# Patient Record
Sex: Male | Born: 1935 | Race: White | Hispanic: No | Marital: Married | Smoking: Never smoker
Health system: Southern US, Community
[De-identification: ages and names within clinical notes are randomized; demographics above are authoritative.]

## PROBLEM LIST (undated history)

## (undated) DIAGNOSIS — D696 Thrombocytopenia, unspecified: Secondary | ICD-10-CM

## (undated) DIAGNOSIS — I10 Essential (primary) hypertension: Secondary | ICD-10-CM

## (undated) DIAGNOSIS — M1990 Unspecified osteoarthritis, unspecified site: Secondary | ICD-10-CM

## (undated) DIAGNOSIS — E785 Hyperlipidemia, unspecified: Secondary | ICD-10-CM

## (undated) DIAGNOSIS — I480 Paroxysmal atrial fibrillation: Secondary | ICD-10-CM

## (undated) DIAGNOSIS — E039 Hypothyroidism, unspecified: Secondary | ICD-10-CM

## (undated) HISTORY — PX: US ECHOCARDIOGRAPHY: HXRAD669

## (undated) HISTORY — PX: CORONARY ARTERY BYPASS GRAFT: SHX141

## (undated) HISTORY — DX: Hyperlipidemia, unspecified: E78.5

## (undated) HISTORY — DX: Unspecified osteoarthritis, unspecified site: M19.90

## (undated) HISTORY — DX: Paroxysmal atrial fibrillation: I48.0

## (undated) HISTORY — PX: CARDIAC CATHETERIZATION: SHX172

## (undated) HISTORY — DX: Hypothyroidism, unspecified: E03.9

## (undated) HISTORY — DX: Essential (primary) hypertension: I10

## (undated) HISTORY — DX: Thrombocytopenia, unspecified: D69.6

## (undated) HISTORY — PX: OTHER SURGICAL HISTORY: SHX169

---

## 2003-08-07 ENCOUNTER — Emergency Department (HOSPITAL_COMMUNITY): Admission: EM | Admit: 2003-08-07 | Discharge: 2003-08-07 | Payer: Self-pay | Admitting: Emergency Medicine

## 2004-12-10 ENCOUNTER — Ambulatory Visit: Payer: Self-pay | Admitting: Unknown Physician Specialty

## 2005-03-21 ENCOUNTER — Ambulatory Visit: Payer: Self-pay

## 2006-02-05 ENCOUNTER — Encounter: Admission: RE | Admit: 2006-02-05 | Discharge: 2006-02-05 | Payer: Self-pay | Admitting: Sports Medicine

## 2006-06-02 ENCOUNTER — Ambulatory Visit: Payer: Self-pay

## 2006-09-18 ENCOUNTER — Encounter: Payer: Self-pay | Admitting: Vascular Surgery

## 2006-09-19 ENCOUNTER — Encounter: Payer: Self-pay | Admitting: Vascular Surgery

## 2006-09-19 ENCOUNTER — Inpatient Hospital Stay (HOSPITAL_COMMUNITY): Admission: RE | Admit: 2006-09-19 | Discharge: 2006-09-27 | Payer: Self-pay | Admitting: *Deleted

## 2006-10-21 ENCOUNTER — Encounter: Admission: RE | Admit: 2006-10-21 | Discharge: 2006-10-21 | Payer: Self-pay | Admitting: Surgery

## 2006-10-23 ENCOUNTER — Encounter (HOSPITAL_COMMUNITY): Admission: RE | Admit: 2006-10-23 | Discharge: 2007-01-21 | Payer: Self-pay | Admitting: *Deleted

## 2007-01-22 ENCOUNTER — Encounter (HOSPITAL_COMMUNITY): Admission: RE | Admit: 2007-01-22 | Discharge: 2007-02-06 | Payer: Self-pay | Admitting: *Deleted

## 2009-01-09 DEATH — deceased

## 2009-04-24 ENCOUNTER — Ambulatory Visit: Payer: Self-pay | Admitting: Internal Medicine

## 2010-01-12 ENCOUNTER — Inpatient Hospital Stay (HOSPITAL_COMMUNITY): Admission: EM | Admit: 2010-01-12 | Discharge: 2010-01-14 | Payer: Self-pay | Admitting: Emergency Medicine

## 2010-01-13 ENCOUNTER — Encounter (INDEPENDENT_AMBULATORY_CARE_PROVIDER_SITE_OTHER): Payer: Self-pay | Admitting: Internal Medicine

## 2010-04-12 ENCOUNTER — Ambulatory Visit: Payer: Self-pay | Admitting: Unknown Physician Specialty

## 2010-10-01 ENCOUNTER — Ambulatory Visit: Payer: Self-pay | Admitting: Internal Medicine

## 2010-10-10 ENCOUNTER — Ambulatory Visit: Payer: Self-pay

## 2010-12-02 ENCOUNTER — Encounter: Payer: Self-pay | Admitting: Surgery

## 2011-02-03 LAB — CK TOTAL AND CKMB (NOT AT ARMC)
CK, MB: 0.9 ng/mL (ref 0.3–4.0)
Relative Index: INVALID (ref 0.0–2.5)
Relative Index: INVALID (ref 0.0–2.5)
Total CK: 34 U/L (ref 7–232)
Total CK: 34 U/L (ref 7–232)

## 2011-02-03 LAB — COMPREHENSIVE METABOLIC PANEL
ALT: 19 U/L (ref 0–53)
AST: 26 U/L (ref 0–37)
Albumin: 3.1 g/dL — ABNORMAL LOW (ref 3.5–5.2)
Alkaline Phosphatase: 71 U/L (ref 39–117)
BUN: 12 mg/dL (ref 6–23)
Chloride: 106 mEq/L (ref 96–112)
Creatinine, Ser: 1.33 mg/dL (ref 0.4–1.5)
GFR calc Af Amer: >60 mL/min (ref >60)
Glucose, Bld: 129 mg/dL — ABNORMAL HIGH (ref 70–99)
Potassium: 4.7 mEq/L (ref 3.5–5.1)
Sodium: 139 mEq/L (ref 135–145)
Total Bilirubin: 0.9 mg/dL (ref 0.3–1.2)
Total Bilirubin: 1.2 mg/dL (ref 0.3–1.2)
Total Protein: 6.2 g/dL (ref 6.0–8.3)
Total Protein: 6.6 g/dL (ref 6.0–8.3)

## 2011-02-03 LAB — CBC
HCT: 41.9 % (ref 39.0–52.0)
MCV: 90.8 fL (ref 78.0–100.0)
MCV: 91 fL (ref 78.0–100.0)
Platelets: 166 10*3/uL (ref 150–400)
RDW: 13.3 % (ref 11.5–15.5)
RDW: 13.5 % (ref 11.5–15.5)
WBC: 9.7 10*3/uL (ref 4.0–10.5)

## 2011-02-03 LAB — POCT CARDIAC MARKERS
CKMB, poc: <1 ng/mL — ABNORMAL LOW (ref 1.0–8.0)
Myoglobin, poc: 87.4 ng/mL (ref 12–200)
Troponin i, poc: <0.05 ng/mL (ref 0.00–0.09)
Troponin i, poc: <0.05 ng/mL (ref 0.00–0.09)

## 2011-02-03 LAB — TROPONIN I: Troponin I: 0.04 ng/mL (ref 0.00–0.06)

## 2011-02-03 LAB — DIFFERENTIAL
Eosinophils Relative: 1 % (ref 0–5)
Monocytes Absolute: 0.7 10*3/uL (ref 0.1–1.0)
Neutro Abs: 7.9 10*3/uL — ABNORMAL HIGH (ref 1.7–7.7)
Neutrophils Relative %: 81 % — ABNORMAL HIGH (ref 43–77)

## 2011-02-03 LAB — URINALYSIS, ROUTINE W REFLEX MICROSCOPIC
Glucose, UA: NEGATIVE mg/dL
Ketones, ur: 15 mg/dL — AB
Nitrite: NEGATIVE
Specific Gravity, Urine: 1.013 (ref 1.005–1.030)
pH: 6 (ref 5.0–8.0)

## 2011-02-03 LAB — URINE MICROSCOPIC-ADD ON

## 2011-02-03 LAB — URINE CULTURE: Colony Count: NO GROWTH

## 2011-03-29 NOTE — Discharge Summary (Signed)
[name], [name]                  ACCOUNT NO.:  [id number]   MEDICAL RECORD NO.:  [id number]          PATIENT TYPE:  INP   LOCATION:  2004                         FACILITY:  [facility]   PHYSICIAN:  Theda Belfast, PA DATE OF BIRTH:  02-17-1933   DATE OF ADMISSION:  09/18/2006  DATE OF DISCHARGE:                                 DISCHARGE SUMMARY   PRIMARY DIAGNOSIS:  1. Severe three-vessel coronary artery disease.  2. Sick sinus syndrome.   IN HOSPITAL DIAGNOSES:  Postoperative acute blood loss anemia.   SECONDARY DIAGNOSES:  1. Hypothyroidism.  2. Hyperlipidemia.   OPERATIONS AND PROCEDURES:  1. Coronary artery bypass graft x3 using a left internal mammary artery      graft to left anterior descending coronary, saphenous vein graft to      first obtuse marginal branch of left circumflex coronary, saphenous      vein graft to right coronary artery.  Endoscopic vein harvesting from      right leg.  2. Placement of permanent pacemaker.   HISTORY AND PHYSICAL AND HOSPITAL COURSE:  The patient is a 77 year old  gentleman with no prior history of heart disease.  He does have a history of  hypothyroidism and hyperlipidemia recently diagnosed with an irregular heart  rate.  He underwent a 48-hour Holter monitor that showed apparent sick sinus  syndrome with rates ranging from 42 to 105 with severe brief episodes of  what appeared to be atrial fibrillation.  He has had at least two episodes  of positive measuring 2.7-2.8 seconds.  The patient underwent cardiac  catheterization by Dr. Jenne Campus which showed a 95% ostial LAD stenosis.  There is a separate ostium from the LAD and left circumflex.  There was also  an 80% ostial stenosis of a large first marginal branch.  The right coronary  artery with a small nondominant vessel that had 70% ostial stenosis.  Left  ventricular ejection fraction was about 60%.  There was no gradient across  the aortic valve.  Following catheterization Dr.  Laneta Simmers was consulted.  Dr.  Laneta Simmers saw and evaluated the patient.  He discussed with the patient  undergoing coronary artery bypass grafting.  The risks and benefits were  discussed.  The patient acknowledged understanding and agreed to proceed.  Surgery was scheduled for September 22, 2006.  Prior to undergoing surgery,  the patient did have bilateral carotid duplex ultrasound showed no ICA  stenosis.  He did remain stable.  For details of the patient's past medical  history and physical exam, please see dictated history and physical.   The patient was taken to the operating room on September 22, 2006, where he  underwent coronary artery bypass grafting x3 using a left internal mammary  artery to left anterior descending coronary, saphenous vein graft to first  obtuse marginal branch of left circumflex coronary, saphenous vein graft to  right coronary artery.  Endoscopic vein harvesting from the right leg was  done.  The patient tolerated this procedure well and was transferred to the  intensive care unit in stable condition.  Following surgery, the patient was  seen to be hemodynamically stable with hematocrit 32%.  He was extubated the  early morning following surgery.  Following extubation, the patient was seen  to be alert and or x4.  The patient's postoperative course was pretty much  unremarkable.  Due to his recent diagnosis of sick sinus syndrome, the  patient required to be A-paced.  On postop day one, the patient was noted to  have an acute blood loss anemia with hemoglobin 9.6 and hematocrit 28.  The  patient was asymptomatic.  Hemoglobin and hematocrit were monitored closely.  They did remain stable.  Her was able to be weaned off drips and systolic  blood pressure maintaining greater than 100.  Chest tubes were discontinued  and the patient was transferred out to 2000 on postop day one. On the  telemetry floor, the patient's vitals continued to be monitored.  He  remained A  paced.  He was able to be weaned off oxygen sating greater than  90%.  The patient was noted to be afebrile.  The patient's incisions were  clean, dry and intact and healing well.  Cardiology did follow the patient  postoperatively due to sick sinus syndrome and planned for permanent  pacemaker.  The patient remained stable and permanent pacemaker was  scheduled for implant on September 25, 2006.  The patient tolerated the dual  chamber permanent pacemaker implant without complications.  Again, this was  done September 25, 2006.  The patient was out of bed ambulating well  postoperatively.  He was tolerating a regular diet well.  Last labs obtained  postop day three showed a white count of 7.5, hemoglobin 10.3, hematocrit  29.8, platelet count 120.  Sodium 138, potassium 4.5, BUN of 11, creatinine  1, glucose of 125.   The patient is tentatively ready for discharge home the next 1-2 days.   FOLLOW-UP APPOINTMENTS:  A follow-up appointment will be arranged with Dr.  Laneta Simmers for in three weeks.  Our office is to contact the patient with this  information.  The patient will need to obtain a PA and lateral chest x-ray  one hour prior to this appointment.  The patient will need to follow up Dr.  Jenne Campus in two weeks.  He will need to contact Dr. Mikey Bussing office to  arrange this appointment.   DISCHARGE INSTRUCTIONS:  The patient was instructed no driving until  released to do so, no lifting over 10 pounds.  He is told to ambulate 3-4  times per day, progress as tolerated, and continue his breathing exercises.  He is also instructions by cardiology pertaining to permanent pacemaker.  The patient was told to shower washing his incisions using soap and water.  He is to contact the office if he develops any drainage or opening from any  of his incision sites.  The patient was educated on diet to be low-fat and  low-salt.   DISCHARGE MEDICATIONS:  1. Aspirin 325 mg daily.  2. Toprol XL 25 mg  daily. 3. Altace 2.5 mg at night.  4. Zetia 10 mg daily.  5. Synthroid 75 mcg daily.  6. Niacin 500 mg at night.  7. Multivitamin daily.  8. Oxycodone 5 mg 1-2 tablets q.4-6h. p.r.n. pain.      Theda Belfast, PA     KMD/MEDQ  D:  09/25/2006  T:  09/25/2006  Job:  [id number]   cc:   Darlin Priestly, MD

## 2011-03-29 NOTE — Op Note (Signed)
[name], [name]                  ACCOUNT NO.:  [id number]   MEDICAL RECORD NO.:  [id number]          PATIENT TYPE:  INP   LOCATION:  2305                         FACILITY:  [facility]   PHYSICIAN:  Evelene Croon, M.D.     DATE OF BIRTH:  09-25-1933   DATE OF PROCEDURE:  DATE OF DISCHARGE:                               OPERATIVE REPORT   PREOPERATIVE DIAGNOSIS:  Severe three-vessel coronary disease.   POSTOPERATIVE DIAGNOSIS:  Severe three-vessel coronary disease.   OPERATIVE PROCEDURE:  Median sternotomy, extracorporeal circulation,  coronary bypass graft surgery x3 using a left internal mammary artery  graft to the left anterior descending coronary, with a saphenous vein  graft to the first obtuse marginal branch of the left circumflex  coronary, and a saphenous vein graft to the right coronary artery.  Endoscopic vein harvesting from the right leg.   ATTENDING SURGEON:  Evelene Croon, M.D.   ASSISTANT:  Ammie Ferrier, PA-C.   ANESTHESIA:  General endotracheal.   CLINICAL HISTORY:  This patient is a 77 year old gentleman with no prior  history of heart disease who does have a history of hypothyroidism and  hyperlipidemia and was recently diagnosed with an irregular heart rate.  He underwent a 48-hour Holter monitor that showed apparent sick sinus  syndrome with rates ranging from 42-105 with several brief episodes of  what appeared to be atrial fibrillation.  He also had at least two  episodes of pauses measuring 2.7 and 2.8 seconds.  He underwent cardiac  catheterization by Dr. Jenne Campus which showed a 95% ostial LAD stenosis.  There was dampening with a 4-French catheter.  There was a separate  ostium for the LAD and left circumflex.  There was also an 80% ostial  stenosis of a large first marginal branch.  The right coronary artery  was a small nondominant vessel that had 70% ostial stenosis.  Left  ventricular ejection fraction was about 60%.  There was no gradient  across  the aortic valve.  After review of the angiogram and examination  of the patient, it was felt that coronary artery bypass graft surgery  was the best treatment for the patient.  I discussed the operative  procedure with he and his wife, including alternatives, benefits, and  risks, including, but not limited to bleeding, blood transfusion,  infection, stroke, myocardial infarction, graft failure, and death.  I  also discussed the possibility that he may still require insertion of a  permanent pacemaker postoperatively for sick sinus syndrome.  He  understood all this and agreed to proceed.   OPERATIVE PROCEDURE:  The patient was taken to the operating room and  placed on the table in supine position.  After induction of general  endotracheal anesthesia, a Foley catheter was placed in the bladder  using sterile technique.  Then the chest, abdomen and both lower  extremities were prepped and draped in the usual sterile manner.  The  chest was entered through a median sternotomy incision and the  pericardium left of the midline.  Examination of the heart showed good  ventricular contractility.  The  ascending aorta had no palpable plaques  in it.   Then the left internal mammary artery was harvested from the chest wall  as a pedicle graft.  This was a medium caliber vessel with excellent  blood flow through it.  At the same time, the segment of greater  saphenous vein was harvested from the right leg using endoscopic vein  harvest technique.  This vein was a medium size and good quality.   The patient was then heparinized and when an adequate activated clotting  time was achieved,  the distal ascending aorta was cannulated using 20-  French aortic cannula for arterial inflow.  Venous outflow was achieved  using a two-stage venous cannula for the right atrial appendage.  An  antegrade cardioplegia and vent cannula was inserted in the aortic root.   The patient was placed on cardiopulmonary  bypass and distal coronaries  identified.  The LAD was a large graftable vessel with no significant  distal disease in it.  The first marginal was a large graftable vessel  with no distal disease in it.  The right coronary was a small  nondominant vessel that basically terminated as a moderate sized acute  marginal branch.  This was a graftable vessel.   Then the aorta was cross clamped and 500 mL of cold blood antegrade  cardioplegia was administered in the aortic root with quick arrest of  the heart.  Systemic hypothermia to 28  degrees centigrade and topical  hypothermia iced saline was used.  Temperature probe was placed in  septum, insulated and padded in the pericardium.   The first distal anastomosis was performed to the obtuse marginal  branch.  The internal diameter was about 2.5 mm.  The conduit used was a  segment of greater saphenous vein and anastomosis performed in an end-to-  side manner with continuous 7-0 Prolene suture.  Flow was noted through  the graft and was excellent.   Second distal anastomosis was performed to the right coronary artery.  The internal diameter of this vessel was about 1.6 mm.  Conduit used was  a second segment of  greater saphenous vein and anastomosis performed in  end-to-side manner with continuous 7-0 Prolene suture.  Flow was noted  through the graft and was excellent.  Then another dose of cardioplegia  was given to all the vein grafts and aortic root.   The third distal anastomosis was performed at the midportion of the left  anterior descending coronary.  The internal diameter was about 2 mm.  Conduit used was a left internal mammary graft and this was brought  through an opening in the left pericardium anterior to the phrenic  nerve.  It was anastomosed to the LAD in end-to-side manner using  continuous 8-0 Prolene suture.  The pedicle was sutured to the epicardium with 6-0 Prolene sutures.  The patient was then rewarmed to  37  degrees centigrade.  With the crossclamp in place, the two proximal  vein graft anastomoses were performed to the aortic root in end-to-side  manner using continuous 6-0 Prolene suture.  Then the clamp was removed  from the mammary pedicle.  There was rapid warming of the ventricular  septum and return of spontaneous ventricular fibrillation.  The  crossclamp was removed with a time of 55 minutes and the patient  spontaneously converted to sinus rhythm.   The proximal and distal anastomoses appeared hemostatic and all the  grafts satisfactory.  Graft markers were placed around the proximal  anastomoses.  Two temporary right ventricular and right atrial pacing  wires were placed and brought out through the skin.   When the patient was rewarmed to 37 degrees centigrade, he was weaned  from cardiopulmonary bypass on no inotropic agents.  Total bypass time  was 69 minutes.  Cardiac function appeared excellent with a cardiac  output of 5 liters per minute.  Protamine was given and the venous and  aortic cannulas removed without difficulty.  Hemostasis was achieved.  Three chest tubes were placed with a tube in the posterior pericardium,  one in the left pleural space and one in the anterior mediastinum.  The  pericardium was reapproximated over the heart.  The sternum was closed  with #6 stainless steel wires.  The fascia was closed with continuous #1  Vicryl suture.  Subcutaneous tissue was closed with continuous 2-0  Vicryl and the skin with 3-0 Vicryl subcuticular closure.  The lower  extremity vein harvest site was closed in layers in a similar manner.  The sponge, needle and instrument counts were correct.  __________.  Dry sterile dressings were applied over the incisions and  around the chest tubes, __________ and Pleur-Evac suction.  The patient  remained hemodynamically stable and was transferred to the SICU in  guarded but stable condition.      Evelene Croon, M.D.   Electronically Signed     BB/MEDQ  D:  09/22/2006  T:  09/22/2006  Job:  [id number]

## 2011-03-29 NOTE — Op Note (Signed)
[name], [name]                  ACCOUNT NO.:  [id number]   MEDICAL RECORD NO.:  [id number]          PATIENT TYPE:  INP   LOCATION:  2004                         FACILITY:  [facility]   PHYSICIAN:  Darlin Priestly, MD  DATE OF BIRTH:  07/14/1933   DATE OF PROCEDURE:  09/25/2006  DATE OF DISCHARGE:                               OPERATIVE REPORT   PROCEDURE:  Insertion of a Medtronic EnRhythm [id number] generator with  passive ventricular and active atrial leads.   ATTENDING:  Darlin Priestly, MD.   COMPLICATIONS:  None.   INDICATIONS:  [name] is a 77 year old male, patient of Dr. Beverely Risen  in [place], with a history of arrhythmias, ultimately referred for  Holter monitor revealing 2.7 and 2.8 second pauses.  He did have normal  LV function by 2-D echocardiogram.  He was subsequently brought for  cardiac catheterization on September 18, 2006, with plans for cath  followed by pacer implant; however, at his catheterization, he was noted  to have significant two-vessel CAD involving the ostium of the LAD.  He  underwent bypass surgery by Dr. Evelene Croon on September 22, 2006,  without complication.  Postop, the patient did continue to have  intermittent pauses up to 3 seconds.  He is now brought for dual-chamber  pacer implant.   DESCRIPTION OF OPERATION:  After giving informed consent, the patient  was brought to the cardiac catheterization lab.  The left chest was  shaved, prepped, and draped in sterile fashion.  Lidocaine, 1%, was then  used to anesthetize the left subclavicular area.  Next, an approximately  3 cm horizontal mid-infraclavicular incision was then carried out and  hemostasis was obtained with electrocautery.  Blunt dissection was used  to carry this down to the pectoral fascia.   Next, a 3 x 4 cm pocket was then created over the left pectoral fascia,  and, again, hemostasis was obtained with electrocautery.  The left  subclavian vein was then entered using  fluoro and contrast guidance.  A  guidewire was then easily passed into the [facility] and right atrium.  Over  this, a #9-French dilator sheath was then placed into the left  subclavian.  The dilator and guidewire were then removed.  Through this,  a 58-cm passive Medtronic lead, model number [id number], serial number  [id number], was then passed into the right atrium.  The guidewire was  retained.  The peel-away sheath was removed.  A second 7-French dilating  sheath was inserted over the retained guidewire.  The guidewire and  dilator were removed.  Through this, a 52-cm active Medtronic lead,  model number [id number], serial number [id number], was then passed into the  right atrium, and then the peel-away sheath was removed.  A J curve was  then placed in the ventricular lead stylet.  A ventricular lead was then  placed in the RV apex.  Thresholds were then determined.  R-wave was  measured at 14.3 mV.  Impedance 660 ohms.  Threshold of the ventricle  was 0.3 volts at 0.5 msec.  Current is 0.4 milliamps.  Ten volts were  negative for diaphragmatic stimulation.  A J-curve stylet was then  placed in the atrial lead and the atrial lead was then centered in the  right atrial appendage.  We were able to capture 2 volts.  The head  screw was then extended and thresholds were then remeasured.  P-waves  measured 2.3 volts.  Impedance 670 ohms.  Threshold was 0.7 volts at 0.5  msec with excellent current of injury.  Current is 1.3 milliamps.  Again, 10 volts are negative for diaphragmatic stimulation.  Then, 2-0  silk sutures were used to anchor each lead to the pectoral fascia.  The  pocket was then copiously irrigated with 1% Kanamycin solution.  Again,  hemostasis was confirmed.  The leads were then connected in serial  fashion to a Medtronic EnRhythm generator, model number [id number], serial  number [id number].  The screws were tightened.  Pacing was confirmed.  A  single silk suture was placed in the apex of  the pocket.  The leads and  generator were then delivered into the pocket and a silk suture was used  to secure the pacer header.  Subcutaneous layers were then closed with  running 2-0 Vicryl.  The skin was then closed using running 4-0 Vicryl.  Steri-strips were applied.  The patient was transferred to the recovery  room in stable condition.   CONCLUSIONS:  Successful implant of a Medtronic EnRhythm [id number]  generator, serial number [id number], with passive ventricular and active  atrial leads.      Darlin Priestly, MD  Electronically Signed     RHM/MEDQ  D:  09/25/2006  T:  09/26/2006  Job:  [id number]   cc:   Dr. Beverely Risen

## 2011-03-29 NOTE — Cardiovascular Report (Signed)
[name], [name]                  ACCOUNT NO.:  [id number]   MEDICAL RECORD NO.:  [id number]          PATIENT TYPE:  OIB   LOCATION:  3707                         FACILITY:  [facility]   PHYSICIAN:  Darlin Priestly, MD  DATE OF BIRTH:  January 14, 1933   DATE OF PROCEDURE:  09/18/2006  DATE OF DISCHARGE:                              CARDIAC CATHETERIZATION   PROCEDURE:  1. Left heart catheterization.  2. Coronary angiography.  3. Left ventriculography.   ATTENDING PHYSICIAN:  Delman Cheadle, M.D.   COMPLICATIONS:  None.   INDICATIONS FOR PROCEDURE:  Mr. Tierce is a 77 year old male patient of Dr.  Beverely Risen in [place] with a history of intermittent tachycardic  palpitations.  The patient was found to have paroxysmal atrial fibrillation  with sick-sinus syndrome with 2.7 and 2.8 second pauses.  He has remained  relatively active, still playing tennis with no significant chest pain or  shortness of breath, though he does have occasional episodes of feeling  lightheaded.  He has had no syncope or presyncope.  He did undergo a 2D  echocardiogram revealing normal LV function with no significant valvular  abnormalities, and with mild MR, in Dr. Benjamine Mola office.  He does  have a family history of atrial fibrillation with a mother who did require  pacemaker in her later years of life.  He is now brought for cardiac  catheterization to rule out CAD and preparation for a possible pacemaker  implant.   DESCRIPTION OF PROCEDURE:  After informed consent, the patient was brought  to the cardiac cath lab, where he was shaved, prepped and draped in the  usual sterile fashion.  ECG monitoring was established.  Using a modified  Seldinger technique, a 6-French arterial sheath was inserted into the right  femoral artery.  A 6-French diagnostic catheter was used to perform  diagnostic angiography.   The left main arises from a separate ostium and is severely diseased at the  ostium and early  portion.  There is pressure damping with a 6-French and 4-  French catheters.  There is a 95% to 99% ostial and early proximal LAD  lesion.  The remainder of the LAD has no significant disease.   Th left circumflex arises from a separate ostium.  It is a large vessel and  was dominant, it goes to the two obtuse marginals to the PDA and  posterolateral branch.  The circumflex has no significant disease.   The first diagonal is a large vessel which bifurcates distally with a 70% to  80% ostial lesion.  The second diagonal is a small vessel with no  significant disease.   The PDA and posterolateral branch are large vessels with no significant  disease.   The right coronary artery is a small and nondominant vessel which is  calcified at its ostium with a 70% ostial lesion.   Left ventriculogram has an EF of 60%.   HEMODYNAMICS:  Systemic arterial pressure 193/84, LV systemic pressure  186/90, LVEDP of 20.   CONCLUSION:  1. Significant 2-vessel CAD involving severe disease ostial LAD which  originates with a vertical takeoff.  2. Normal LV systolic function.  3. Systemic hypertension.  4. Elevated LVEDP.      Darlin Priestly, MD  Electronically Signed     RHM/MEDQ  D:  09/18/2006  T:  09/19/2006  Job:  [id number]   cc:   Beverely Risen, M.D.

## 2011-03-29 NOTE — Consult Note (Signed)
NAMEIBRAHEEM, [name]                  ACCOUNT NO.:  [id number]   MEDICAL RECORD NO.:  [id number]          PATIENT TYPE:  OIB   LOCATION:  3707                         FACILITY:  [facility]   PHYSICIAN:  Evelene Croon, M.D.     DATE OF BIRTH:  [birth year]   DATE OF CONSULTATION:  09/19/2006  DATE OF DISCHARGE:                                 CONSULTATION   REASON FOR CONSULTATION:  Three-vessel coronary artery disease.   CLINICAL HISTORY:  I was asked to evaluate this very nice 77 year old  gentleman for consideration of coronary artery bypass graft surgery.  He  has no prior history of heart disease and has never been hospitalized  before but does have a history of hypothyroidism and hyperlipidemia.  He  was diagnosed with an irregular heart rate by his primary physician Dr.  Welton Flakes.  He was sent to Dr. Jenne Campus and had a 48-hour Holter monitor  placed which showed apparent sick sinus syndrome with rates ranging from  42-105 with several brief episodes of what appeared to be atrial  fibrillation.  He also had a least two episodes of pauses of 2.7-2.8  seconds.  He reportedly had a recent echocardiogram that showed no  significant valvular abnormality.  He subsequently underwent cardiac  catheterization yesterday which showed three-vessel coronary artery  disease.  There was 95% ostial LAD stenosis with dampening with a 4-  French catheter.  There was a separate ostium for the LAD and left  circumflex.  There was 80% ostial stenosis of a large first marginal  branch.  The right coronary was a small nondominant vessel that had 70%  ostial stenosis.  Left ventricular ejection fraction about 60%.  There  is no gradient across the aortic valve.   REVIEW OF SYSTEMS:  GENERAL:  He denies any fever or chills.  He has had  about 1 month history of tiredness and fatigue.  He has continued to be  very active but has periods when he feels like he cannot do anything.  He had no chest pain or pressure.  EYES:  Negative  ENT:  Negative.  ENDOCRINE:  He has no history of diabetes.  He does have hypothyroidism.  CARDIOVASCULAR:  As above.  He denies any history of chest pressure or  pain.  He has had no pain in his neck or jaw or arms.  He denies any  shortness of breath with exertion.  He has had palpitations.  Denies PND  and orthopnea.  He has had no peripheral edema.  RESPIRATORY:  Denies cough and sputum production.  GI:  He has had no nausea or vomiting.  Denies melena and bright red  blood per rectum.  GU:  He denies dysuria and hematuria.  MUSCULOSKELETAL:  He denies arthralgias and myalgias.   ALLERGIES:  NONE.   PSYCHIATRIC:  Negative.  NEUROLOGICAL:  He denies any focal weakness or numbness.  Denies  dizziness and syncope.   PAST MEDICAL HISTORY:  1. Significant for hypothyroidism.  2. Hyperlipidemia.   He has no prior surgery and  no prior hospitalizations.  FAMILY HISTORY:  His mother died at age 47 of a stroke.  She has a  history of atrial fibrillation and had a pacemaker placed.  His father  died at age 91 of unknown reasons.   SOCIAL HISTORY:  He is a retired Proofreader.  He  has been married for 45 years and has two children.  He drinks  occasional beer but has never smoked.   MEDICATIONS PRIOR TO ADMISSION:  1. Synthroid 0.75 mcg daily.  2. Zetia 10 mg daily.  3. Niaspan 500 mg daily.  4. Aspirin 81 mg daily.  5. A multivitamin daily.   PHYSICAL EXAMINATION:  His blood pressure is 102/49, his pulse is 70 and  regular.  Respiratory rate is 18 and unlabored.  He is a well-developed  white male in no distress.  HEENT:  Exam shows him to be normocephalic and atraumatic.  Pupils are  equal and reactive to light accommodation.  Extraocular muscles are  intact.  Throat is clear.  NECK:  Exam shows normal carotid pulses bilaterally.  No are no bruits.  There is no adenopathy or thyromegaly.  CARDIAC:  Exam shows regular rate and rhythm  with normal S1 and S2.  There is no murmur, rub or gallop.  LUNGS:  Clear.  ABDOMINAL:  Exam shows active bowel sounds.  His abdomen  is soft and  nontender.  There are no palpable masses or organomegaly.  EXTREMITY:  Exam shows no peripheral edema.  Pedal pulses are palpable  bilaterally.  There are a few varicosities in the right lower leg.  NEUROLOGIC:  Exam shows him to be alert and oriented x3.  Motor and  sensory exams are grossly normal.  SKIN:  Warm and dry.   IMPRESSION:  [name] has three-vessel coronary disease with high-grade  ostial left anterior descending stenosis.  He has fatigue and tiredness  which is likely related to ischemia.  He has also had sick sinus  syndrome.  I agree that coronary artery bypass graft surgery is the best  treatment to prevent further ischemia and infarction.  Then, a decision  can be made by cardiology about whether he requires a permanent  pacemaker.  I discussed the operative procedure of coronary artery  bypass graft surgery with he and his wife including alternatives,  benefits, and risks including but not limited to bleeding, blood  transfusion, infection, stroke, myocardial infarction, graft failure,  and death.  He understands and would like to proceed with surgery.  We  will plan to this on Monday September 22, 2006.      Evelene Croon, M.D.  Electronically Signed     BB/MEDQ  D:  09/19/2006  T:  09/20/2006  Job:  [id number]

## 2013-01-08 ENCOUNTER — Other Ambulatory Visit (HOSPITAL_COMMUNITY): Payer: Self-pay | Admitting: Cardiovascular Disease

## 2013-01-28 ENCOUNTER — Ambulatory Visit (HOSPITAL_COMMUNITY)
Admission: RE | Admit: 2013-01-28 | Discharge: 2013-01-28 | Disposition: A | Discharge status: Home / Self Care | Payer: Medicare Other | Source: Ambulatory Visit | Attending: Cardiovascular Disease | Admitting: Cardiovascular Disease

## 2013-01-28 DIAGNOSIS — I714 Abdominal aortic aneurysm, without rupture, unspecified: Secondary | ICD-10-CM | POA: Insufficient documentation

## 2013-01-28 NOTE — Progress Notes (Signed)
Aorta Duplex Completed. [name]  

## 2013-04-28 ENCOUNTER — Other Ambulatory Visit: Payer: Self-pay

## 2013-04-28 ENCOUNTER — Other Ambulatory Visit: Payer: Self-pay | Admitting: Cardiovascular Disease

## 2013-04-28 DIAGNOSIS — I480 Paroxysmal atrial fibrillation: Secondary | ICD-10-CM

## 2013-04-28 DIAGNOSIS — I495 Sick sinus syndrome: Secondary | ICD-10-CM

## 2013-04-28 DIAGNOSIS — R0989 Other specified symptoms and signs involving the circulatory and respiratory systems: Secondary | ICD-10-CM

## 2013-04-30 ENCOUNTER — Encounter: Payer: Self-pay | Admitting: Cardiovascular Disease

## 2013-04-30 LAB — PACEMAKER DEVICE OBSERVATION
AL AMPLITUDE: 1.9 mv
AL THRESHOLD: 0.5 V
ATRIAL PACING PM: 87
BAMS-0001: 171 {beats}/min
RV LEAD THRESHOLD: 0.5 V
VENTRICULAR PACING PM: 0.1

## 2013-05-11 ENCOUNTER — Ambulatory Visit (HOSPITAL_COMMUNITY): Payer: 59

## 2013-05-11 ENCOUNTER — Ambulatory Visit (HOSPITAL_COMMUNITY)
Admission: RE | Admit: 2013-05-11 | Discharge: 2013-05-11 | Disposition: A | Discharge status: Home / Self Care | Payer: Medicare Other | Source: Ambulatory Visit | Attending: Cardiology | Admitting: Cardiology

## 2013-05-11 DIAGNOSIS — E785 Hyperlipidemia, unspecified: Secondary | ICD-10-CM | POA: Insufficient documentation

## 2013-05-11 DIAGNOSIS — I2510 Atherosclerotic heart disease of native coronary artery without angina pectoris: Secondary | ICD-10-CM | POA: Insufficient documentation

## 2013-05-11 DIAGNOSIS — I495 Sick sinus syndrome: Secondary | ICD-10-CM

## 2013-05-11 DIAGNOSIS — R0989 Other specified symptoms and signs involving the circulatory and respiratory systems: Secondary | ICD-10-CM

## 2013-05-11 DIAGNOSIS — I10 Essential (primary) hypertension: Secondary | ICD-10-CM | POA: Insufficient documentation

## 2013-05-11 DIAGNOSIS — I480 Paroxysmal atrial fibrillation: Secondary | ICD-10-CM

## 2013-05-11 DIAGNOSIS — Z951 Presence of aortocoronary bypass graft: Secondary | ICD-10-CM | POA: Insufficient documentation

## 2013-05-11 NOTE — Progress Notes (Signed)
Carotid Duplex Completed. Rashee Marschall, RDMS, RVT  

## 2013-05-11 NOTE — Progress Notes (Signed)
[facility]   2D echo completed 05/11/2013.   Cindy Cece Milhouse, RDCS  

## 2013-07-28 ENCOUNTER — Other Ambulatory Visit: Payer: Self-pay

## 2013-07-28 LAB — PACEMAKER DEVICE OBSERVATION
AL AMPLITUDE: 2.2 mv
BAMS-0001: 171 {beats}/min
BATTERY VOLTAGE: 2.95 V
RV LEAD AMPLITUDE: 8.4 mv
RV LEAD IMPEDENCE PM: 464 Ohm

## 2013-08-05 ENCOUNTER — Encounter: Payer: Self-pay | Admitting: Cardiovascular Disease

## 2013-09-13 ENCOUNTER — Telehealth: Payer: Self-pay | Admitting: Internal Medicine

## 2013-09-13 NOTE — Telephone Encounter (Signed)
pt has recall in Jan with Dr. C/MT

## 2013-09-30 ENCOUNTER — Other Ambulatory Visit: Payer: Self-pay

## 2013-09-30 MED ORDER — VALSARTAN 80 MG PO TABS: 80.0000 mg | ORAL_TABLET | Freq: Every day | ORAL | Status: DC

## 2013-09-30 NOTE — Telephone Encounter (Signed)
Rx was sent to pharmacy electronically. 

## 2013-11-18 ENCOUNTER — Encounter: Payer: Self-pay | Admitting: Cardiovascular Disease

## 2013-11-18 ENCOUNTER — Ambulatory Visit (INDEPENDENT_AMBULATORY_CARE_PROVIDER_SITE_OTHER): Payer: Medicare Other | Admitting: Cardiovascular Disease

## 2013-11-18 VITALS — BP 142/82 | HR 65 | Resp 16 | Ht 69.5 in | Wt 171.6 lb

## 2013-11-18 DIAGNOSIS — I10 Essential (primary) hypertension: Secondary | ICD-10-CM

## 2013-11-18 DIAGNOSIS — E785 Hyperlipidemia, unspecified: Secondary | ICD-10-CM

## 2013-11-18 DIAGNOSIS — I471 Supraventricular tachycardia: Secondary | ICD-10-CM

## 2013-11-18 DIAGNOSIS — I495 Sick sinus syndrome: Secondary | ICD-10-CM

## 2013-11-18 DIAGNOSIS — I498 Other specified cardiac arrhythmias: Secondary | ICD-10-CM

## 2013-11-18 DIAGNOSIS — Z950 Presence of cardiac pacemaker: Secondary | ICD-10-CM

## 2013-11-18 DIAGNOSIS — I2510 Atherosclerotic heart disease of native coronary artery without angina pectoris: Secondary | ICD-10-CM

## 2013-11-18 LAB — PACEMAKER DEVICE OBSERVATION

## 2013-11-18 NOTE — Patient Instructions (Signed)
Remote monitoring is used to monitor your pacemaker from home. This monitoring reduces the number of office visits required to check your device to one time per year. It allows us to keep an eye on the functioning of your device to ensure it is working properly. You are scheduled for a device check from home on 02-21-2014. You may send your transmission at any time that day. If you have a wireless device, the transmission will be sent automatically. After your physician reviews your transmission, you will receive a postcard with your next transmission date.  Your physician recommends that you schedule a follow-up appointment in: 6 months

## 2013-11-19 ENCOUNTER — Encounter: Payer: Self-pay | Admitting: Cardiovascular Disease

## 2013-11-19 DIAGNOSIS — I471 Supraventricular tachycardia: Secondary | ICD-10-CM | POA: Insufficient documentation

## 2013-11-19 DIAGNOSIS — I495 Sick sinus syndrome: Secondary | ICD-10-CM | POA: Insufficient documentation

## 2013-11-19 DIAGNOSIS — Z950 Presence of cardiac pacemaker: Secondary | ICD-10-CM | POA: Insufficient documentation

## 2013-11-19 DIAGNOSIS — E7800 Pure hypercholesterolemia, unspecified: Secondary | ICD-10-CM | POA: Insufficient documentation

## 2013-11-19 DIAGNOSIS — I2510 Atherosclerotic heart disease of native coronary artery without angina pectoris: Secondary | ICD-10-CM | POA: Insufficient documentation

## 2013-11-19 DIAGNOSIS — I10 Essential (primary) hypertension: Secondary | ICD-10-CM | POA: Insufficient documentation

## 2013-11-19 LAB — MDC_IDC_ENUM_SESS_TYPE_INCLINIC
Lead Channel Impedance Value: 448 Ohm
Lead Channel Pacing Threshold Amplitude: 1 V
Lead Channel Pacing Threshold Pulse Width: 0.4 ms
Lead Channel Pacing Threshold Pulse Width: 0.4 ms
Lead Channel Sensing Intrinsic Amplitude: 8.4 mV
Lead Channel Setting Sensing Sensitivity: 0.9 mV
MDC IDC MSMT BATTERY VOLTAGE: 2.93 V
MDC IDC MSMT LEADCHNL RA IMPEDANCE VALUE: 464 Ohm
MDC IDC MSMT LEADCHNL RA PACING THRESHOLD AMPLITUDE: 1 V
MDC IDC MSMT LEADCHNL RA SENSING INTR AMPL: 2.3 mV
MDC IDC SET LEADCHNL RA PACING AMPLITUDE: 2 V
MDC IDC SET LEADCHNL RV PACING AMPLITUDE: 2 V
MDC IDC SET LEADCHNL RV PACING PULSEWIDTH: 0.4 ms
MDC IDC STAT BRADY AP VP PERCENT: 0.1 % — AB
MDC IDC STAT BRADY AP VS PERCENT: 87.3 %
MDC IDC STAT BRADY AS VP PERCENT: 0.1 % — AB
MDC IDC STAT BRADY AS VS PERCENT: 12.7 %

## 2013-11-19 NOTE — Assessment & Plan Note (Signed)
Status post pacemaker implantation for symptomatic bradycardia, notes 91% atrial pacing on the average and no ventricular pacing

## 2013-11-19 NOTE — Assessment & Plan Note (Signed)
His pacemaker occasionally records very brief episodes of atrial tachycardia/possible atrial flutter that generally last less than 5 minutes in duration. He does not have palpitations. I do not think these events reached the threshold where we would recommend anticoagulation therapy. Continue to monitor.

## 2013-11-19 NOTE — Assessment & Plan Note (Signed)
Normal device function. We discussed the fact that this particular model is occasionally prone to rapid battery depletion. He is not truly pacemaker dependent his underlying rhythm is sinus bradycardia in the 40s. He is a good candidate for remote monitoring via the care link system and we will alternate office visits with remote device checks every 3 months.

## 2013-11-19 NOTE — Assessment & Plan Note (Signed)
His blood pressure is borderline high today, but according to him is usually lower. No changes are made to his medications

## 2013-11-19 NOTE — Assessment & Plan Note (Signed)
He is currently on a statin holiday for myalgia, but it sounds more likely that his musculoskeletal pain is secondary to a pulled muscle or sprain. While on statin therapy he had excellent lipid profile (December 2013 total cholesterol 144, triglycerides 55, HDL 59, LDL 74) I encouraged him to resume the Crestor once his symptoms have resolved.

## 2013-11-19 NOTE — Assessment & Plan Note (Signed)
He is asymptomatic in the setting of a very active lifestyle. The focus is on risk factor modification. I encouraged him to continue being physically active.

## 2013-11-19 NOTE — Progress Notes (Signed)
Patient ID: [name], male   DOB: Mar 11, 1933, 80 y.o.   MRN: [id number]      Reason for office visit CAD, pacemaker followup, hypertension, hyperlipidemia  [name] is a delightful 80 year old gentleman who is physically very reactive. He plays doubles tennis several times a week. He had bypass surgery in 2007 and has been asymptomatic since. He is currently on a statin "holiday" for some aching in his left thigh. It sounds like he pulled a muscle rather than statin myalgia.  He had a dual chamber permanent pacemaker implanted in 2007 for symptomatic bradycardia secondary to sinus node dysfunction. He has an underlying sinus rate in the 40s. His device periodically has recorded very brief episodes of atrial tachycardia/possible atrial flutter never longer than 5 minutes in duration. He is never aware of these episodes.   Allergies  Allergen Reactions  . Sulfa Antibiotics Rash    Current Outpatient Prescriptions  Medication Sig Dispense Refill  . aspirin 81 MG tablet Take 81 mg by mouth daily.      Marland Kitchen levothyroxine (SYNTHROID, LEVOTHROID) 137 MCG tablet Take 137 mcg by mouth daily before breakfast.      . Multiple Vitamin (MULTIVITAMIN) tablet Take 1 tablet by mouth daily.      . valsartan (DIOVAN) 80 MG tablet Take 1 tablet (80 mg total) by mouth daily.  30 tablet  5  . Vitamins-Lipotropics (LIPO-FLAVONOID PLUS PO) Take 1 tablet by mouth daily.       No current facility-administered medications for this visit.    No past medical history on file.  No past surgical history on file.  No family history on file.  History   Social History  . Marital Status: Married    Spouse Name: N/A    Number of Children: N/A  . Years of Education: N/A   Occupational History  . Not on file.   Social History Main Topics  . Smoking status: Never Smoker   . Smokeless tobacco: Not on file  . Alcohol Use: Yes     Comment: occas.  . Drug Use: No  . Sexual Activity: Not on file   Other  Topics Concern  . Not on file   Social History Narrative  . No narrative on file    Review of systems: The patient specifically denies any chest pain at rest or with exertion, dyspnea at rest or with exertion, orthopnea, paroxysmal nocturnal dyspnea, syncope, palpitations, focal neurological deficits, intermittent claudication, lower extremity edema, unexplained weight gain, cough, hemoptysis or wheezing.  The patient also denies abdominal pain, nausea, vomiting, dysphagia, diarrhea, constipation, polyuria, polydipsia, dysuria, hematuria, frequency, urgency, abnormal bleeding or bruising, fever, chills, unexpected weight changes, mood swings, change in skin or hair texture, change in voice quality, auditory or visual problems, allergic reactions or rashes, new musculoskeletal complaints other than thigh pain described above   PHYSICAL EXAM BP 142/82  Pulse 65  Ht 5' 9.5" (1.765 m)  Wt 171 lb 9.6 oz (77.837 kg)  BMI 24.99 kg/m2  General: Alert, oriented x3, no distress Head: no evidence of trauma, PERRL, EOMI, no exophtalmos or lid lag, no myxedema, no xanthelasma; normal ears, nose and oropharynx Neck: normal jugular venous pulsations and no hepatojugular reflux; brisk carotid pulses without delay and no carotid bruits Chest: clear to auscultation, no signs of consolidation by percussion or palpation, normal fremitus, symmetrical and full respiratory excursions; not immune pacemaker scar both appear healthy Cardiovascular: normal position and quality of the apical impulse, regular rhythm, normal  first and second heart sounds, no murmurs, rubs or gallops Abdomen: no tenderness or distention, no masses by palpation, no abnormal pulsatility or arterial bruits, normal bowel sounds, no hepatosplenomegaly Extremities: no clubbing, cyanosis or edema; 2+ radial, ulnar and brachial pulses bilaterally; 2+ right femoral, posterior tibial and dorsalis pedis pulses; 2+ left femoral, posterior tibial and  dorsalis pedis pulses; no subclavian or femoral bruits Neurological: grossly nonfocal   EKG: Atrial paced ventricular sensed, otherwise normal  Lipid Panel  December 2013 total cholesterol 144, triglycerides 55, HDL 59, LDL 74  BMET    Component Value Date/Time   NA 141 01/13/2010 0628   K 4.8 01/13/2010 0628   CL 106 01/13/2010 0628   CO2 27 01/13/2010 0628   GLUCOSE 92 01/13/2010 0628   BUN 12 01/13/2010 0628   CREATININE 1.33 01/13/2010 0628   CALCIUM 9.2 01/13/2010 0628   GFRNONAA 53* 01/13/2010 0628   GFRAA  Value: >60        The eGFR has been calculated using the MDRD equation. This calculation has not been validated in all clinical situations. eGFR's persistently <60 mL/min signify possible Chronic Kidney Disease. 01/13/2010 2336     ASSESSMENT AND PLAN CAD s/p CABG 2007 He is asymptomatic in the setting of a very active lifestyle. The focus is on risk factor modification. I encouraged him to continue being physically active.  SSS (sick sinus syndrome) Status post pacemaker implantation for symptomatic bradycardia, notes 91% atrial pacing on the average and no ventricular pacing  Pacemaker - Medtronic Enrhythm 2007 Normal device function. We discussed the fact that this particular model is occasionally prone to rapid battery depletion. He is not truly pacemaker dependent his underlying rhythm is sinus bradycardia in the 40s. He is a good candidate for remote monitoring via the care link system and we will alternate office visits with remote device checks every 3 months.  SVT (supraventricular tachycardia) His pacemaker occasionally records very brief episodes of atrial tachycardia/possible atrial flutter that generally last less than 5 minutes in duration. He does not have palpitations. I do not think these events reached the threshold where we would recommend anticoagulation therapy. Continue to monitor.  HTN (hypertension) His blood pressure is borderline high today, but according to him  is usually lower. No changes are made to his medications  Hyperlipidemia He is currently on a statin holiday for myalgia, but it sounds more likely that his musculoskeletal pain is secondary to a pulled muscle or sprain. While on statin therapy he had excellent lipid profile (December 2013 total cholesterol 144, triglycerides 55, HDL 59, LDL 74) I encouraged him to resume the Crestor once his symptoms have resolved.  Orders Placed This Encounter  Procedures  . EKG 12-Lead   Meds ordered this encounter  Medications  . levothyroxine (SYNTHROID, LEVOTHROID) 137 MCG tablet    Sig: Take 137 mcg by mouth daily before breakfast.  . aspirin 81 MG tablet    Sig: Take 81 mg by mouth daily.  . Multiple Vitamin (MULTIVITAMIN) tablet    Sig: Take 1 tablet by mouth daily.  . Vitamins-Lipotropics (LIPO-FLAVONOID PLUS PO)    Sig: Take 1 tablet by mouth daily.    Holli Humbles, MD, [place] [phone] office [phone] pager

## 2013-11-22 ENCOUNTER — Encounter: Payer: Self-pay | Admitting: Cardiovascular Disease

## 2013-12-07 ENCOUNTER — Ambulatory Visit: Payer: Self-pay | Admitting: Physician Assistant

## 2014-01-14 ENCOUNTER — Telehealth: Payer: Self-pay | Admitting: *Deleted

## 2014-01-14 NOTE — Telephone Encounter (Signed)
Pt was supposed to have a remote device check and he is concerned bc he has not heard about when this needs to be done.  [facility]

## 2014-01-14 NOTE — Telephone Encounter (Signed)
[name] - Please call the pt back and inform him of his scheduled appt for remote check in April.  Thanks. ~ [employer] M. Lorin PicketScott, BSN, RN

## 2014-02-21 ENCOUNTER — Encounter: Payer: Medicare Other | Admitting: *Deleted

## 2014-03-07 ENCOUNTER — Telehealth: Payer: Self-pay | Admitting: Cardiovascular Disease

## 2014-03-07 NOTE — Telephone Encounter (Signed)
Have not heard from his monitor results from 02-21-14.Please call and let him know. Message was left on Shakila voicemail.

## 2014-03-09 ENCOUNTER — Telehealth: Payer: Self-pay | Admitting: Cardiovascular Disease

## 2014-03-09 NOTE — Telephone Encounter (Signed)
This is the second time i have called . On this 13th of this month he did a monitor of his pacer remotely and have not heard anything back from the results of the remote check .Marland Kitchen. Please call    Thanks

## 2014-03-09 NOTE — Telephone Encounter (Signed)
Patient informed that remote was not received. Patient advised to resend transmission.

## 2014-03-22 ENCOUNTER — Encounter: Payer: Self-pay | Admitting: *Deleted

## 2014-03-22 ENCOUNTER — Ambulatory Visit: Payer: Medicare Other | Admitting: Cardiovascular Disease

## 2014-03-23 ENCOUNTER — Ambulatory Visit (INDEPENDENT_AMBULATORY_CARE_PROVIDER_SITE_OTHER): Payer: Medicare Other | Admitting: *Deleted

## 2014-03-23 DIAGNOSIS — I495 Sick sinus syndrome: Secondary | ICD-10-CM

## 2014-03-23 LAB — MDC_IDC_ENUM_SESS_TYPE_INCLINIC
Battery Voltage: 2.92 V
Brady Statistic AP VP Percent: 0.02 %
Brady Statistic AP VS Percent: 88.1 %
Brady Statistic AS VP Percent: 0 %
Brady Statistic RA Percent Paced: 88.12 %
Date Time Interrogation Session: 20150513140550
Lead Channel Pacing Threshold Amplitude: 0.5 V
Lead Channel Pacing Threshold Amplitude: 0.5 V
Lead Channel Pacing Threshold Pulse Width: 0.4 ms
Lead Channel Sensing Intrinsic Amplitude: 10.8922
Lead Channel Sensing Intrinsic Amplitude: 2.4297
Lead Channel Setting Pacing Amplitude: 2.5 V
Lead Channel Setting Pacing Pulse Width: 0.4 ms
MDC IDC MSMT LEADCHNL RA IMPEDANCE VALUE: 464 Ohm
MDC IDC MSMT LEADCHNL RV IMPEDANCE VALUE: 464 Ohm
MDC IDC MSMT LEADCHNL RV PACING THRESHOLD PULSEWIDTH: 0.4 ms
MDC IDC SET LEADCHNL RA PACING AMPLITUDE: 2 V
MDC IDC SET LEADCHNL RV SENSING SENSITIVITY: 0.9 mV
MDC IDC SET ZONE DETECTION INTERVAL: 330 ms
MDC IDC STAT BRADY AS VS PERCENT: 11.88 %
MDC IDC STAT BRADY RV PERCENT PACED: 0.02 %
Zone Setting Detection Interval: 350 ms

## 2014-03-23 LAB — PACEMAKER DEVICE OBSERVATION

## 2014-03-23 NOTE — Progress Notes (Signed)
Pacemaker check in clinic. Normal device function. Thresholds, sensing, impedances consistent with previous measurements. Device programmed to maximize longevity. No mode switches.  1 fast AV.  Device programmed at appropriate safety margins. Histogram distribution appropriate for patient activity level. Device programmed to optimize intrinsic conduction. Patient education completed.  ROV 3 months with Dr. Royann Shiversroitoru.

## 2014-04-28 ENCOUNTER — Ambulatory Visit (INDEPENDENT_AMBULATORY_CARE_PROVIDER_SITE_OTHER): Payer: Medicare Other | Admitting: Cardiology

## 2014-04-28 ENCOUNTER — Encounter: Payer: Self-pay | Admitting: Cardiology

## 2014-04-28 VITALS — BP 142/80 | HR 64 | Ht 69.0 in | Wt 169.3 lb

## 2014-04-28 DIAGNOSIS — Z950 Presence of cardiac pacemaker: Secondary | ICD-10-CM

## 2014-04-28 DIAGNOSIS — E785 Hyperlipidemia, unspecified: Secondary | ICD-10-CM

## 2014-04-28 DIAGNOSIS — I495 Sick sinus syndrome: Secondary | ICD-10-CM

## 2014-04-28 NOTE — Assessment & Plan Note (Signed)
Recently found to be orthostatic and apparently had high K+- taken off Lopressor and Diovan by Primary Care MD

## 2014-04-28 NOTE — Assessment & Plan Note (Signed)
Back on statin. 

## 2014-04-28 NOTE — Assessment & Plan Note (Signed)
He has had document PSVT in past

## 2014-04-28 NOTE — Progress Notes (Signed)
04/28/2014 [name]   03/10/1933  [id number]  Primary Physicia Lyndon CodeKHAN, Ian M, MD Primary Cardiologist: Dr [name]  HPI:  [name] is a 80 year old gentleman who is physically very reactive. He plays doubles tennis several times a week. 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 He had bypass surgery in 2007 and has been asymptomatic since. He had a dual chamber permanent pacemaker implanted in 2007 for symptomatic bradycardia secondary to sinus node dysfunction. He has an underlying sinus rate in the 40s. His device periodically has recorded very brief episodes of atrial tachycardia/possible atrial flutter never longer than 5 minutes in duration.            He recently had some dizzy spells that sound orthostatic in nature. He was seen by his primary care provider- [place] and had labs drawn. He was taken off his Diovan 80 mg and Toprol 25 mg. The pt tells me his K+ was high as well. He has not had syncope and his symptoms have improved.     Current Outpatient Prescriptions  Medication Sig Dispense Refill  . aspirin 81 MG tablet Take 81 mg by mouth daily.      . CRESTOR 10 MG tablet Take 1 tablet by mouth daily.      Marland Kitchen. levothyroxine (SYNTHROID, LEVOTHROID) 137 MCG tablet Take 137 mcg by mouth daily before breakfast.      . Vitamins-Lipotropics (LIPO-FLAVONOID PLUS PO) Take 1 tablet by mouth daily.       No current facility-administered medications for this visit.    Allergies  Allergen Reactions  . Sulfa Antibiotics Rash    History   Social History  . Marital Status: Married    Spouse Name: N/A    Number of Children: N/A  . Years of Education: N/A   Occupational History  . Not on file.   Social History Main Topics  . Smoking status: Never Smoker   . Smokeless tobacco: Not on file  . Alcohol Use: Yes     Comment: occas.  . Drug Use: No  . Sexual Activity: Not on file   Other Topics Concern  . Not on file   Social History  Narrative  . No narrative on file     Review of Systems: General: negative for chills, fever, night sweats or weight changes.  Cardiovascular: negative for chest pain, dyspnea on exertion, edema, orthopnea, palpitations, paroxysmal nocturnal dyspnea or shortness of breath Dermatological: negative for rash Respiratory: negative for cough or wheezing Urologic: negative for hematuria Abdominal: negative for nausea, vomiting, diarrhea, bright red blood per rectum, melena, or hematemesis Neurologic: negative for visual changes, syncope, or dizziness All other systems reviewed and are otherwise negative except as noted above.    Blood pressure 142/80, pulse 64, height 5\' 9"  (1.753 m), weight 169 lb 4.8 oz (76.794 kg).  General appearance: alert, cooperative and no distress Neck: no carotid bruit and no JVD Lungs: clear to auscultation bilaterally Heart: regular rate and rhythm   ASSESSMENT AND PLAN:   HTN (hypertension) Recently found to be orthostatic and apparently had high K+- taken off Lopressor and Diovan by Primary Care MD  Pacemaker - Medtronic Enrhythm 2007 Checked one month ago - "OK"  Hyperlipidemia Back on statin  CAD s/p CABG 2007 Dr. Laneta SimmersBartle 2007, LIMA to LAD, SVG to OM, SVG to RCA Negative Myoview April 2005, EF 65%  SSS (sick sinus syndrome) He has had document PSVT in past  PLAN  I told him it was OK to stay off Diovan. I asked him to resume Toprol 25 mg next week if he feels OK over the weekend. I doubt this low dose will affect his B/P. He'll see Dr C in 3 months.  [name] KPA-C 04/28/2014 11:57 AM

## 2014-04-28 NOTE — Patient Instructions (Signed)
Corine ShelterLuke Kilroy, PA-C, recommends that you stay off Diovan.  If you are feeling okay by next week - no dizzy spells or orthostatic symptoms - please resume Metoprolol Succinate 25 mg.  Your physician recommends that you schedule a follow-up appointment in 3 months with Dr Royann Shiversroitoru.

## 2014-04-28 NOTE — Assessment & Plan Note (Signed)
Checked one month ago - "OK"

## 2014-04-28 NOTE — Assessment & Plan Note (Signed)
Dr. Laneta SimmersBartle 2007, LIMA to LAD, SVG to OM, SVG to RCA Negative Myoview April 2005, EF 65%

## 2014-05-24 ENCOUNTER — Ambulatory Visit: Payer: Medicare Other | Admitting: Cardiovascular Disease

## 2014-07-05 ENCOUNTER — Other Ambulatory Visit: Payer: Self-pay

## 2014-07-05 MED ORDER — METOPROLOL SUCCINATE ER 25 MG PO TB24: 25.0000 mg | ORAL_TABLET | Freq: Every day | ORAL | Status: DC

## 2014-07-05 NOTE — Telephone Encounter (Signed)
Rx was sent to pharmacy electronically. 

## 2014-07-27 ENCOUNTER — Telehealth: Payer: Self-pay | Admitting: Cardiovascular Disease

## 2014-07-29 NOTE — Telephone Encounter (Signed)
Closed encounter °

## 2014-08-02 ENCOUNTER — Encounter: Payer: Self-pay | Admitting: Cardiovascular Disease

## 2014-08-02 ENCOUNTER — Telehealth: Payer: Self-pay | Admitting: Cardiovascular Disease

## 2014-08-02 ENCOUNTER — Other Ambulatory Visit: Payer: Self-pay | Admitting: *Deleted

## 2014-08-02 ENCOUNTER — Ambulatory Visit (INDEPENDENT_AMBULATORY_CARE_PROVIDER_SITE_OTHER): Payer: Medicare Other | Admitting: Cardiovascular Disease

## 2014-08-02 VITALS — BP 110/70 | HR 65 | Resp 18 | Ht 69.0 in | Wt 166.6 lb

## 2014-08-02 DIAGNOSIS — I471 Supraventricular tachycardia: Secondary | ICD-10-CM

## 2014-08-02 DIAGNOSIS — I495 Sick sinus syndrome: Secondary | ICD-10-CM

## 2014-08-02 DIAGNOSIS — I498 Other specified cardiac arrhythmias: Secondary | ICD-10-CM

## 2014-08-02 DIAGNOSIS — R5383 Other fatigue: Secondary | ICD-10-CM

## 2014-08-02 DIAGNOSIS — Z45010 Encounter for checking and testing of cardiac pacemaker pulse generator [battery]: Secondary | ICD-10-CM

## 2014-08-02 DIAGNOSIS — E782 Mixed hyperlipidemia: Secondary | ICD-10-CM

## 2014-08-02 DIAGNOSIS — Z950 Presence of cardiac pacemaker: Secondary | ICD-10-CM

## 2014-08-02 DIAGNOSIS — Z45018 Encounter for adjustment and management of other part of cardiac pacemaker: Secondary | ICD-10-CM

## 2014-08-02 DIAGNOSIS — Z79899 Other long term (current) drug therapy: Secondary | ICD-10-CM

## 2014-08-02 DIAGNOSIS — I2510 Atherosclerotic heart disease of native coronary artery without angina pectoris: Secondary | ICD-10-CM

## 2014-08-02 DIAGNOSIS — E785 Hyperlipidemia, unspecified: Secondary | ICD-10-CM

## 2014-08-02 DIAGNOSIS — D689 Coagulation defect, unspecified: Secondary | ICD-10-CM

## 2014-08-02 DIAGNOSIS — I10 Essential (primary) hypertension: Secondary | ICD-10-CM

## 2014-08-02 DIAGNOSIS — R5381 Other malaise: Secondary | ICD-10-CM

## 2014-08-02 LAB — MDC_IDC_ENUM_SESS_TYPE_INCLINIC
Battery Voltage: 2.93 V
Brady Statistic AP VP Percent: 0 %
Brady Statistic AS VS Percent: 14.48 %
Brady Statistic RV Percent Paced: 65.12 %
Date Time Interrogation Session: 20150922101252
Lead Channel Impedance Value: 440 Ohm
Lead Channel Sensing Intrinsic Amplitude: 2.1205
Lead Channel Setting Pacing Amplitude: 2 V
Lead Channel Setting Pacing Pulse Width: 0.4 ms
MDC IDC MSMT LEADCHNL RA PACING THRESHOLD AMPLITUDE: 1 V
MDC IDC MSMT LEADCHNL RA PACING THRESHOLD PULSEWIDTH: 0.4 ms
MDC IDC MSMT LEADCHNL RV IMPEDANCE VALUE: 448 Ohm
MDC IDC MSMT LEADCHNL RV PACING THRESHOLD AMPLITUDE: 1 V
MDC IDC MSMT LEADCHNL RV PACING THRESHOLD PULSEWIDTH: 0.4 ms
MDC IDC MSMT LEADCHNL RV SENSING INTR AMPL: 8.4326
MDC IDC SET LEADCHNL RV PACING AMPLITUDE: 2.5 V
MDC IDC SET LEADCHNL RV SENSING SENSITIVITY: 0.9 mV
MDC IDC SET ZONE DETECTION INTERVAL: 330 ms
MDC IDC SET ZONE DETECTION INTERVAL: 350 ms
MDC IDC STAT BRADY AP VS PERCENT: 20.4 %
MDC IDC STAT BRADY AS VP PERCENT: 65.11 %
MDC IDC STAT BRADY RA PERCENT PACED: 20.4 %

## 2014-08-02 NOTE — Progress Notes (Signed)
Reason for office visit CAD, pacemaker followup, hypertension, hyperlipidemia   [name] is a 80 year old gentleman who remains physically active. He plays doubles tennis several times a week. He had bypass surgery in 2007 (Dr. Cyndia Bent 2007, LIMA to LAD, SVG to OM, SVG to RCA) and has been asymptomatic since. He had a dual chamber permanent pacemaker (Medtronic EnRhythm) implanted in 2007 for symptomatic bradycardia secondary to sinus node dysfunction. He has an underlying sinus rate in the 40s. His device periodically has recorded very brief episodes of atrial tachycardia/possible atrial flutter never longer than 5 minutes in duration. He is never aware of these episodes. Reportedly has a remote history of paroxysmal atrial fibrillation, none detected recently. He typically paces the atrium 90% of the time, virtually no ventricular pacing.  He had some problems with orthostatic dizziness in the spring of 2015 and his medications were temporarily stopped. A lower dose of metoprolol was started in June and he is now also back on valsartan. He has infrequent dizziness. He stopped taking Crestor about 4-5 months ago for myalgia. He is no longer taking any lipid-lowering medication.    Allergies  Allergen Reactions  . Crestor [Rosuvastatin]     myalgias  . Sulfa Antibiotics Rash    Current Outpatient Prescriptions  Medication Sig Dispense Refill  . aspirin 81 MG tablet Take 81 mg by mouth daily.      Marland Kitchen levothyroxine (SYNTHROID, LEVOTHROID) 137 MCG tablet Take 137 mcg by mouth daily before breakfast.      . metoprolol succinate (TOPROL XL) 25 MG 24 hr tablet Take 1 tablet (25 mg total) by mouth daily.  30 tablet  10  . valsartan (DIOVAN) 80 MG tablet Take 40 mg by mouth daily.      . Vitamins-Lipotropics (LIPO-FLAVONOID PLUS PO) Take 1 tablet by mouth daily.       No current facility-administered medications for this visit.    Past Medical History  Diagnosis Date  . Hyperlipidemia   .  Hypothyroidism     Past Surgical History  Procedure Laterality Date  . Placement of permanent pacemaker    . Coronary artery bypass graft      x3 using a left internal mammary artery  . Cardiac catheterization      Dr. Tami Ribas which showed a 95% ostial LAD stenosis  . US echocardiography      lft ventricular ejection fraction was about 60%  . Bilateral carotid duplex ultrasound showed no ica      stenosis    No family history on file.  History   Social History  . Marital Status: Married    Spouse Name: N/A    Number of Children: N/A  . Years of Education: N/A   Occupational History  . Not on file.   Social History Main Topics  . Smoking status: Never Smoker   . Smokeless tobacco: Not on file  . Alcohol Use: Yes     Comment: occas.  . Drug Use: No  . Sexual Activity: Not on file   Other Topics Concern  . Not on file   Social History Narrative  . No narrative on file    Review of systems: The patient specifically denies any chest pain at rest or with exertion, dyspnea at rest or with exertion, orthopnea, paroxysmal nocturnal dyspnea, syncope, palpitations, focal neurological deficits, intermittent claudication, lower extremity edema, unexplained weight gain, cough, hemoptysis or wheezing.  The patient also denies abdominal pain, nausea, vomiting, dysphagia, diarrhea, constipation,  polyuria, polydipsia, dysuria, hematuria, frequency, urgency, abnormal bleeding or bruising, fever, chills, unexpected weight changes, mood swings, change in skin or hair texture, change in voice quality, auditory or visual problems, allergic reactions or rashes, new musculoskeletal complaints other than usual "aches and pains".   PHYSICAL EXAM BP 110/70  Pulse 65  Resp 18  Ht 5' 9"  (1.753 m)  Wt 75.569 kg (166 lb 9.6 oz)  BMI 24.59 kg/m2  General: Alert, oriented x3, no distress Head: no evidence of trauma, PERRL, EOMI, no exophtalmos or lid lag, no myxedema, no xanthelasma; normal  ears, nose and oropharynx Neck: normal jugular venous pulsations and no hepatojugular reflux; brisk carotid pulses without delay and no carotid bruits Chest: clear to auscultation, no signs of consolidation by percussion or palpation, normal fremitus, symmetrical and full respiratory excursions, healthy pacemaker site Cardiovascular: normal position and quality of the apical impulse, regular rhythm, normal first and second heart sounds, no murmurs, rubs or gallops Abdomen: no tenderness or distention, no masses by palpation, no abnormal pulsatility or arterial bruits, normal bowel sounds, no hepatosplenomegaly Extremities: no clubbing, cyanosis or edema; 2+ radial, ulnar and brachial pulses bilaterally; 2+ right femoral, posterior tibial and dorsalis pedis pulses; 2+ left femoral, posterior tibial and dorsalis pedis pulses; no subclavian or femoral bruits Neurological: grossly nonfocal   EKG: VVI pacing at 65 beats per minute with retrograde P waves  Lipid Panel  No results found for this basename: chol, trig, hdl, cholhdl, vldl, ldlcalc    BMET    Component Value Date/Time   NA 141 01/13/2010 0628   K 4.8 01/13/2010 0628   CL 106 01/13/2010 0628   CO2 27 01/13/2010 0628   GLUCOSE 92 01/13/2010 0628   BUN 12 01/13/2010 0628   CREATININE 1.33 01/13/2010 0628   CALCIUM 9.2 01/13/2010 0628   GFRNONAA 53* 01/13/2010 0628   GFRAA  Value: >60        The eGFR has been calculated using the MDRD equation. This calculation has not been validated in all clinical situations. eGFR's persistently <60 mL/min signify possible Chronic Kidney Disease. 01/13/2010 3557     ASSESSMENT AND PLAN CAD s/p CABG 2007 He remains asymptomatic despite a very active lifestyle.  Hyperlipidemia I am concerned that he is no longer on any lipid-lowering therapy. 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 Will review a fasting lipid profile and see if we can find another lipid-lowering regimen that he  can tolerate well.  SSS (sick sinus syndrome)    Pacemaker - Medtronic Enrhythm 2007 Pacemaker actually switched to VVI mode on June 13 so we need to schedule his generator change as soon as possible. He was re-programmed DDDR (MVPR) today and we will schedule a generator change as soon as possible.  HTN (hypertension) Great blood pressure on the current regimen. If he feels dizzy I would advocate stopping the valsartan altogether, it is already in an extremely low dose.  SVT (supraventricular tachycardia) No meaningful atrial tachyarrhythmias recorded. Paroxysmal atrial fibrillation as reported in his history before undergoing bypass surgery but has not been reported since.   Orders Placed This Encounter  Procedures  . EKG 12-Lead   Meds ordered this encounter  Medications  . valsartan (DIOVAN) 80 MG tablet    Sig: Take 40 mg by mouth daily.    Holli Humbles, MD, [place] [phone] office [phone] pager

## 2014-08-02 NOTE — Patient Instructions (Addendum)
Dr. Royann Shivers recommends you have a pacemaker battery change out. Physiological scientist).  This is done as an out- patient at [facility].  Your physician recommends that you return for lab work in: This lab work needs to be done at least several days before the procedure at [facility].  Dr. Royann Shivers recommends that you schedule a follow-up appointment in: One week after battery change out for wound check.  Then follow-up in 6 months.

## 2014-08-02 NOTE — Assessment & Plan Note (Signed)
I am concerned that he is no longer on any lipid-lowering therapy. 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 Will review a fasting lipid profile and see if we can find another lipid-lowering regimen that he can tolerate well.

## 2014-08-02 NOTE — Assessment & Plan Note (Signed)
Great blood pressure on the current regimen. If he feels dizzy I would advocate stopping the valsartan altogether, it is already in an extremely low dose.

## 2014-08-02 NOTE — Assessment & Plan Note (Signed)
No meaningful atrial tachyarrhythmias recorded. Paroxysmal atrial fibrillation as reported in his history before undergoing bypass surgery but has not been reported since.

## 2014-08-02 NOTE — Assessment & Plan Note (Signed)
Pacemaker actually switched to VVI mode on June 13 so we need to schedule his generator change as soon as possible. He was re-programmed DDDR (MVPR) today and we will schedule a generator change as soon as possible.

## 2014-08-02 NOTE — Assessment & Plan Note (Signed)
He remains asymptomatic despite a very active lifestyle.

## 2014-08-02 NOTE — Telephone Encounter (Signed)
Pt called in stating that he is suppose to have an x-ray prior to coming in to get his battery changed for his pace maker, he would like to know if he has to go to the hospital to have this done. Please call  Thanks

## 2014-08-03 ENCOUNTER — Ambulatory Visit: Payer: Medicare Other | Admitting: Cardiovascular Disease

## 2014-08-03 ENCOUNTER — Encounter: Payer: Medicare Other | Admitting: Cardiovascular Disease

## 2014-08-04 NOTE — Telephone Encounter (Signed)
Spoke to patient.  Verified  Patient does not need xray prior to procedure. Patient is aware and verbalized understanding.

## 2014-08-05 ENCOUNTER — Other Ambulatory Visit: Payer: Self-pay | Admitting: Cardiovascular Disease

## 2014-08-08 ENCOUNTER — Encounter: Payer: Self-pay | Admitting: Cardiovascular Disease

## 2014-08-08 DIAGNOSIS — I10 Essential (primary) hypertension: Secondary | ICD-10-CM | POA: Diagnosis not present

## 2014-08-08 DIAGNOSIS — Z7982 Long term (current) use of aspirin: Secondary | ICD-10-CM | POA: Diagnosis not present

## 2014-08-08 DIAGNOSIS — Z79899 Other long term (current) drug therapy: Secondary | ICD-10-CM | POA: Diagnosis not present

## 2014-08-08 DIAGNOSIS — Z45018 Encounter for adjustment and management of other part of cardiac pacemaker: Secondary | ICD-10-CM | POA: Diagnosis present

## 2014-08-08 DIAGNOSIS — I495 Sick sinus syndrome: Secondary | ICD-10-CM | POA: Diagnosis not present

## 2014-08-08 DIAGNOSIS — E039 Hypothyroidism, unspecified: Secondary | ICD-10-CM | POA: Diagnosis not present

## 2014-08-08 DIAGNOSIS — Z951 Presence of aortocoronary bypass graft: Secondary | ICD-10-CM | POA: Diagnosis not present

## 2014-08-08 DIAGNOSIS — E785 Hyperlipidemia, unspecified: Secondary | ICD-10-CM | POA: Diagnosis not present

## 2014-08-08 DIAGNOSIS — I2510 Atherosclerotic heart disease of native coronary artery without angina pectoris: Secondary | ICD-10-CM | POA: Diagnosis not present

## 2014-08-08 MED ORDER — SODIUM CHLORIDE 0.9 % IR SOLN
80.0000 mg | Status: AC
  Filled 2014-08-08: qty 2

## 2014-08-08 MED ORDER — CEFAZOLIN SODIUM-DEXTROSE 2-3 GM-% IV SOLR: 2.0000 g | INTRAVENOUS | Status: AC

## 2014-08-09 ENCOUNTER — Ambulatory Visit (HOSPITAL_COMMUNITY)
Admission: RE | Admit: 2014-08-09 | Discharge: 2014-08-09 | Disposition: A | Discharge status: Home / Self Care | Payer: Medicare Other | Source: Ambulatory Visit | Attending: Cardiovascular Disease | Admitting: Cardiovascular Disease

## 2014-08-09 ENCOUNTER — Encounter (HOSPITAL_COMMUNITY)
Admission: RE | Disposition: A | Discharge status: Home / Self Care | Payer: Self-pay | Source: Ambulatory Visit | Attending: Cardiovascular Disease

## 2014-08-09 DIAGNOSIS — Z79899 Other long term (current) drug therapy: Secondary | ICD-10-CM | POA: Insufficient documentation

## 2014-08-09 DIAGNOSIS — I2510 Atherosclerotic heart disease of native coronary artery without angina pectoris: Secondary | ICD-10-CM | POA: Insufficient documentation

## 2014-08-09 DIAGNOSIS — Z7982 Long term (current) use of aspirin: Secondary | ICD-10-CM | POA: Insufficient documentation

## 2014-08-09 DIAGNOSIS — E039 Hypothyroidism, unspecified: Secondary | ICD-10-CM | POA: Insufficient documentation

## 2014-08-09 DIAGNOSIS — I10 Essential (primary) hypertension: Secondary | ICD-10-CM | POA: Insufficient documentation

## 2014-08-09 DIAGNOSIS — Z45018 Encounter for adjustment and management of other part of cardiac pacemaker: Secondary | ICD-10-CM | POA: Insufficient documentation

## 2014-08-09 DIAGNOSIS — Z45010 Encounter for checking and testing of cardiac pacemaker pulse generator [battery]: Secondary | ICD-10-CM

## 2014-08-09 DIAGNOSIS — Z951 Presence of aortocoronary bypass graft: Secondary | ICD-10-CM | POA: Insufficient documentation

## 2014-08-09 DIAGNOSIS — I495 Sick sinus syndrome: Secondary | ICD-10-CM | POA: Insufficient documentation

## 2014-08-09 DIAGNOSIS — E785 Hyperlipidemia, unspecified: Secondary | ICD-10-CM | POA: Insufficient documentation

## 2014-08-09 HISTORY — PX: PACEMAKER GENERATOR CHANGE: SHX5481

## 2014-08-09 LAB — SURGICAL PCR SCREEN
MRSA, PCR: NEGATIVE
Staphylococcus aureus: NEGATIVE

## 2014-08-09 LAB — POTASSIUM: Potassium: 4.7 mEq/L (ref 3.7–5.3)

## 2014-08-09 SURGERY — PACEMAKER GENERATOR CHANGE
Anesthesia: LOCAL

## 2014-08-09 MED ORDER — HEPARIN (PORCINE) IN NACL 2-0.9 UNIT/ML-% IJ SOLN
INTRAMUSCULAR | Status: AC
  Filled 2014-08-09: qty 1000

## 2014-08-09 MED ORDER — ACETAMINOPHEN 325 MG PO TABS: 325.0000 mg | ORAL_TABLET | ORAL | Status: DC | PRN

## 2014-08-09 MED ORDER — ONDANSETRON HCL 4 MG/2ML IJ SOLN: 4.0000 mg | Freq: Four times a day (QID) | INTRAMUSCULAR | Status: DC | PRN

## 2014-08-09 MED ORDER — SODIUM CHLORIDE 0.9 % IJ SOLN: 3.0000 mL | INTRAMUSCULAR | Status: DC | PRN

## 2014-08-09 MED ORDER — LIDOCAINE HCL (PF) 1 % IJ SOLN
INTRAMUSCULAR | Status: AC
  Filled 2014-08-09: qty 60

## 2014-08-09 MED ORDER — MIDAZOLAM HCL 5 MG/5ML IJ SOLN
INTRAMUSCULAR | Status: AC
  Filled 2014-08-09: qty 5

## 2014-08-09 MED ORDER — SODIUM CHLORIDE 0.9 % IV SOLN: INTRAVENOUS | Status: DC

## 2014-08-09 MED ORDER — MUPIROCIN 2 % EX OINT
1.0000 "application " | TOPICAL_OINTMENT | Freq: Once | CUTANEOUS | Status: AC
  Administered 2014-08-09: 1 via TOPICAL
  Filled 2014-08-09: qty 22

## 2014-08-09 MED ORDER — SODIUM CHLORIDE 0.9 % IV SOLN
INTRAVENOUS | Status: DC
  Administered 2014-08-09: 15:00:00 via INTRAVENOUS

## 2014-08-09 MED ORDER — MUPIROCIN 2 % EX OINT
TOPICAL_OINTMENT | CUTANEOUS | Status: AC
  Administered 2014-08-09: 1 via TOPICAL
  Filled 2014-08-09: qty 22

## 2014-08-09 NOTE — H&P (View-Only) (Signed)
Reason for office visit CAD, pacemaker followup, hypertension, hyperlipidemia   [name] is a 80 year old gentleman who remains physically active. He plays doubles tennis several times a week. He had bypass surgery in 2007 (Dr. Cyndia Bent 2007, LIMA to LAD, SVG to OM, SVG to RCA) and has been asymptomatic since. He had a dual chamber permanent pacemaker (Medtronic EnRhythm) implanted in 2007 for symptomatic bradycardia secondary to sinus node dysfunction. He has an underlying sinus rate in the 40s. His device periodically has recorded very brief episodes of atrial tachycardia/possible atrial flutter never longer than 5 minutes in duration. He is never aware of these episodes. Reportedly has a remote history of paroxysmal atrial fibrillation, none detected recently. He typically paces the atrium 90% of the time, virtually no ventricular pacing.  He had some problems with orthostatic dizziness in the spring of 2015 and his medications were temporarily stopped. A lower dose of metoprolol was started in June and he is now also back on valsartan. He has infrequent dizziness. He stopped taking Crestor about 4-5 months ago for myalgia. He is no longer taking any lipid-lowering medication.    Allergies  Allergen Reactions  . Crestor [Rosuvastatin]     myalgias  . Sulfa Antibiotics Rash    Current Outpatient Prescriptions  Medication Sig Dispense Refill  . aspirin 81 MG tablet Take 81 mg by mouth daily.      Marland Kitchen levothyroxine (SYNTHROID, LEVOTHROID) 137 MCG tablet Take 137 mcg by mouth daily before breakfast.      . metoprolol succinate (TOPROL XL) 25 MG 24 hr tablet Take 1 tablet (25 mg total) by mouth daily.  30 tablet  10  . valsartan (DIOVAN) 80 MG tablet Take 40 mg by mouth daily.      . Vitamins-Lipotropics (LIPO-FLAVONOID PLUS PO) Take 1 tablet by mouth daily.       No current facility-administered medications for this visit.    Past Medical History  Diagnosis Date  . Hyperlipidemia   .  Hypothyroidism     Past Surgical History  Procedure Laterality Date  . Placement of permanent pacemaker    . Coronary artery bypass graft      x3 using a left internal mammary artery  . Cardiac catheterization      Dr. Tami Ribas which showed a 95% ostial LAD stenosis  . US echocardiography      lft ventricular ejection fraction was about 60%  . Bilateral carotid duplex ultrasound showed no ica      stenosis    No family history on file.  History   Social History  . Marital Status: Married    Spouse Name: N/A    Number of Children: N/A  . Years of Education: N/A   Occupational History  . Not on file.   Social History Main Topics  . Smoking status: Never Smoker   . Smokeless tobacco: Not on file  . Alcohol Use: Yes     Comment: occas.  . Drug Use: No  . Sexual Activity: Not on file   Other Topics Concern  . Not on file   Social History Narrative  . No narrative on file    Review of systems: The patient specifically denies any chest pain at rest or with exertion, dyspnea at rest or with exertion, orthopnea, paroxysmal nocturnal dyspnea, syncope, palpitations, focal neurological deficits, intermittent claudication, lower extremity edema, unexplained weight gain, cough, hemoptysis or wheezing.  The patient also denies abdominal pain, nausea, vomiting, dysphagia, diarrhea, constipation,  polyuria, polydipsia, dysuria, hematuria, frequency, urgency, abnormal bleeding or bruising, fever, chills, unexpected weight changes, mood swings, change in skin or hair texture, change in voice quality, auditory or visual problems, allergic reactions or rashes, new musculoskeletal complaints other than usual "aches and pains".   PHYSICAL EXAM BP 110/70  Pulse 65  Resp 18  Ht 5' 9"  (1.753 m)  Wt 75.569 kg (166 lb 9.6 oz)  BMI 24.59 kg/m2  General: Alert, oriented x3, no distress Head: no evidence of trauma, PERRL, EOMI, no exophtalmos or lid lag, no myxedema, no xanthelasma; normal  ears, nose and oropharynx Neck: normal jugular venous pulsations and no hepatojugular reflux; brisk carotid pulses without delay and no carotid bruits Chest: clear to auscultation, no signs of consolidation by percussion or palpation, normal fremitus, symmetrical and full respiratory excursions, healthy pacemaker site Cardiovascular: normal position and quality of the apical impulse, regular rhythm, normal first and second heart sounds, no murmurs, rubs or gallops Abdomen: no tenderness or distention, no masses by palpation, no abnormal pulsatility or arterial bruits, normal bowel sounds, no hepatosplenomegaly Extremities: no clubbing, cyanosis or edema; 2+ radial, ulnar and brachial pulses bilaterally; 2+ right femoral, posterior tibial and dorsalis pedis pulses; 2+ left femoral, posterior tibial and dorsalis pedis pulses; no subclavian or femoral bruits Neurological: grossly nonfocal   EKG: VVI pacing at 65 beats per minute with retrograde P waves  Lipid Panel  No results found for this basename: chol, trig, hdl, cholhdl, vldl, ldlcalc    BMET    Component Value Date/Time   NA 141 01/13/2010 0628   K 4.8 01/13/2010 0628   CL 106 01/13/2010 0628   CO2 27 01/13/2010 0628   GLUCOSE 92 01/13/2010 0628   BUN 12 01/13/2010 0628   CREATININE 1.33 01/13/2010 0628   CALCIUM 9.2 01/13/2010 0628   GFRNONAA 53* 01/13/2010 0628   GFRAA  Value: >60        The eGFR has been calculated using the MDRD equation. This calculation has not been validated in all clinical situations. eGFR's persistently <60 mL/min signify possible Chronic Kidney Disease. 01/13/2010 7078     ASSESSMENT AND PLAN CAD s/p CABG 2007 He remains asymptomatic despite a very active lifestyle.  Hyperlipidemia I am concerned that he is no longer on any lipid-lowering therapy. 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 Will review a fasting lipid profile and see if we can find another lipid-lowering regimen that he  can tolerate well.  SSS (sick sinus syndrome)    Pacemaker - Medtronic Enrhythm 2007 Pacemaker actually switched to VVI mode on June 13 so we need to schedule his generator change as soon as possible. He was re-programmed DDDR (MVPR) today and we will schedule a generator change as soon as possible.  HTN (hypertension) Great blood pressure on the current regimen. If he feels dizzy I would advocate stopping the valsartan altogether, it is already in an extremely low dose.  SVT (supraventricular tachycardia) No meaningful atrial tachyarrhythmias recorded. Paroxysmal atrial fibrillation as reported in his history before undergoing bypass surgery but has not been reported since.   Orders Placed This Encounter  Procedures  . EKG 12-Lead   Meds ordered this encounter  Medications  . valsartan (DIOVAN) 80 MG tablet    Sig: Take 40 mg by mouth daily.    Holli Humbles, MD, [place] [phone] office [phone] pager

## 2014-08-09 NOTE — Interval H&P Note (Signed)
History and Physical Interval Note:  08/09/2014 1:49 PM  [name]  has presented today for surgery, with the diagnosis of EOL  The various methods of treatment have been discussed with the patient and family. After consideration of risks, benefits and other options for treatment, the patient has consented to  Procedure(s): PACEMAKER GENERATOR CHANGE /BATTERY (N/A) as a surgical intervention .  The patient's history has been reviewed, patient examined, no change in status, stable for surgery.  I have reviewed the patient's chart and labs.  Questions were answered to the patient's satisfaction.     Troyce Gieske

## 2014-08-09 NOTE — Op Note (Signed)
Procedure report  Procedure performed:  1. Dual chamber pacemaker generator changeout  2. Light sedation  Reason for procedure:  1. Device generator at elective replacement interval  Procedure performed by:  Thurmon FairMihai Jenah Vanasten, MD  Complications:  None  Estimated blood loss:  <5 mL  Medications administered during procedure:  Ancef 2 g intravenously, lidocaine 1% 30 mL locally, fentanyl 50 mcg intravenously, Versed 2 mg intravenously Device details:   New Generator Medtronic Adapta model number ADDRL1, serial number [id number] Right atrial lead (chronic) Medtronic, model number [id number], serial [id number] (implanted 09/25/2006) Right ventricular lead (chronic)  Medtronic, model number [id number], serial number [id number] (implanted 09/25/2006)  Explanted generator Medtronic,  model number  Enrhythm, serial number  [id number] (implanted 09/25/2006)  Procedure details:  After the risks and benefits of the procedure were discussed the patient provided informed consent. She was brought to the cardiac catheter lab in the fasting state. The patient was prepped and draped in usual sterile fashion. Local anesthesia with 1% lidocaine was administered to to the left infraclavicular area. A 5-6cm horizontal incision was made parallel with and 2-3 cm caudal to the left clavicle, in the area of an old scar. An older scar was seen closer to the left clavicle. Using minimal electrocautery and mostly sharp and blunt dissection the prepectoral pocket was opened carefully to avoid injury to the loops of chronic leads. Extensive dissection was not necessary. The device was explanted. The pocket was carefully inspected for hemostasis and flushed with copious amounts of antibiotic solution.  The leads were disconnected from the old generator and testing of the lead parameters later showed excellent values. The new generator was connected to the chronic leads, with appropriate pacing noted.   The entire system was then  carefully inserted in the pocket with care been taking that the leads and device assumed a comfortable position without pressure on the incision. Great care was taken that the leads be located deep to the generator. The pocket was then closed in layers using 2 layers of 2-0 Vicryl and cutaneous staples after which a sterile dressing was applied.   At the end of the procedure the following lead parameters were encountered:   Right atrial lead sensed P waves 4.4 mV, impedance 515 ohms, threshold 0.5 at 0.5 ms pulse width.  Right ventricular lead sensed R waves  12.4 mV, impedance 594 ohms, threshold 0.5 at 0.5 ms pulse width.  Thurmon FairMihai Jaime Dome, MD, [facility] [phone] office [phone] pager

## 2014-08-09 NOTE — Discharge Instructions (Signed)

## 2014-08-12 ENCOUNTER — Encounter: Payer: Self-pay | Admitting: Cardiovascular Disease

## 2014-08-16 ENCOUNTER — Encounter: Payer: Self-pay | Admitting: Cardiovascular Disease

## 2014-08-25 ENCOUNTER — Encounter: Payer: Self-pay | Admitting: Cardiology

## 2014-08-25 ENCOUNTER — Ambulatory Visit (INDEPENDENT_AMBULATORY_CARE_PROVIDER_SITE_OTHER): Payer: Medicare Other | Admitting: Cardiology

## 2014-08-25 VITALS — BP 166/97 | HR 80 | Ht 69.0 in | Wt 160.2 lb

## 2014-08-25 DIAGNOSIS — Z950 Presence of cardiac pacemaker: Secondary | ICD-10-CM

## 2014-08-25 DIAGNOSIS — E785 Hyperlipidemia, unspecified: Secondary | ICD-10-CM

## 2014-08-25 NOTE — Assessment & Plan Note (Signed)
LDL 129, will resume crestor 5 mg every other day. If muscle pain re occurs would try pravastatin

## 2014-08-25 NOTE — Patient Instructions (Signed)
Your physician recommends that you schedule a follow-up appointment in: 3 Months with Dr Royann Shiversroitoru  Your physician has recommended you make the following change in your medication: Take Crestor 5 mg every other day

## 2014-08-25 NOTE — Progress Notes (Signed)
08/25/2014   PCP: Lyndon CodeKHAN, FOZIA M, MD   Chief Complaint  Patient presents with  . Follow-up    pt here to remove staples, pt denied SOB and chest pain    Primary Cardiologist: Dr. Ruby ColaM. Croitoru   HPI:  80 year old male back for follow up of gen change with pacemaker.  No complaints.  08/09/14 New Generator Medtronic Adapta model number ADDRL1, serial number [id number]  Right atrial lead (chronic) Medtronic, model number [id number], serial [id number] (implanted 09/25/2006)  Right ventricular lead (chronic) Medtronic, model number [id number], serial number [id number] (implanted 09/25/2006)  Explanted generator Medtronic, model number Enrhythm, serial number [id number] (implanted 09/25/2006) No pain at site, no SOB.   Lipids reviewed.  Allergies  Allergen Reactions  . Crestor [Rosuvastatin]     myalgias  . Latex Itching    PT STATES THAT AFTER WEARING LATEX GLOVES TO WORK IN THAT HIS HANDS WERE VERY ITCHY.  . Sulfa Antibiotics Rash    Current Outpatient Prescriptions  Medication Sig Dispense Refill  . aspirin 81 MG tablet Take 81 mg by mouth daily.      Marland Kitchen. levothyroxine (SYNTHROID, LEVOTHROID) 137 MCG tablet Take 137 mcg by mouth daily before breakfast.      . metoprolol succinate (TOPROL XL) 25 MG 24 hr tablet Take 1 tablet (25 mg total) by mouth daily.  30 tablet  10  . rosuvastatin (CRESTOR) 5 MG tablet Take 5 mg by mouth every other day.      . valsartan (DIOVAN) 80 MG tablet Take 0.5 tablets (40 mg total) by mouth daily.  30 tablet  12  . Vitamins-Lipotropics (LIPO-FLAVONOID PLUS PO) Take 1 tablet by mouth daily.       No current facility-administered medications for this visit.    Past Medical History  Diagnosis Date  . Hyperlipidemia   . Hypothyroidism     Past Surgical History  Procedure Laterality Date  . Placement of permanent pacemaker    . Coronary artery bypass graft      x3 using a left internal mammary artery  . Cardiac catheterization     Dr. Jenne CampusMcQueen which showed a 95% ostial LAD stenosis  . [place] echocardiography      lft ventricular ejection fraction was about 60%  . Bilateral carotid duplex ultrasound showed no ica      stenosis    NWG:NFAOZHY:QMROS:General:no colds or fevers, no weight changes Skin:no rashes or ulcers CV:see HPI PUL:see HPI   Wt Readings from Last 3 Encounters:  08/25/14 160 lb 3.2 oz (72.666 kg)  08/09/14 168 lb (76.204 kg)  08/09/14 168 lb (76.204 kg)    PHYSICAL EXAM BP 166/97  Pulse 80  Ht 5\' 9"  (1.753 m)  Wt 160 lb 3.2 oz (72.666 kg)  BMI 23.65 kg/m2 General:Pleasant affect, NAD Skin:Warm and dry, brisk capillary refill Heart:S1S2 RRR without murmur, gallup, rub or click Lungs:clear without rales, rhonchi, or wheezes Ext:no lower ext edema, 2+ pedal pulses, 2+ radial pulses Neuro:alert and oriented, MAE, follows commands, + facial symmetry  Pacer site cleaned with betadine and Staples removed. Mild redness at each staple site, he will place neosporin on sites.  ASSESSMENT AND PLAN Pacemaker - Medtronic Enrhythm 2007 gen change for pacer follow up.  No complaints, site healing, staples removed.   Hyperlipidemia  LDL 129, will resume crestor 5 mg every other day. If muscle pain re occurs would try pravastatin     Follow up with Dr. Judie PetitM.  Croitoru in 3 months

## 2014-08-25 NOTE — Assessment & Plan Note (Signed)
gen change for pacer follow up.  No complaints, site healing, staples removed.

## 2014-09-14 IMAGING — CR DG HIP COMPLETE 2+V*L*
1 series · 3 of 3 positions shown · non-contrast
Comparison: None.

CLINICAL DATA: Left hip pain.

EXAM:
LEFT HIP - COMPLETE 2+ VIEW

[Series 1: ap · 0.17mm/px · 3 of 3 slices shown]
[im 1/3]
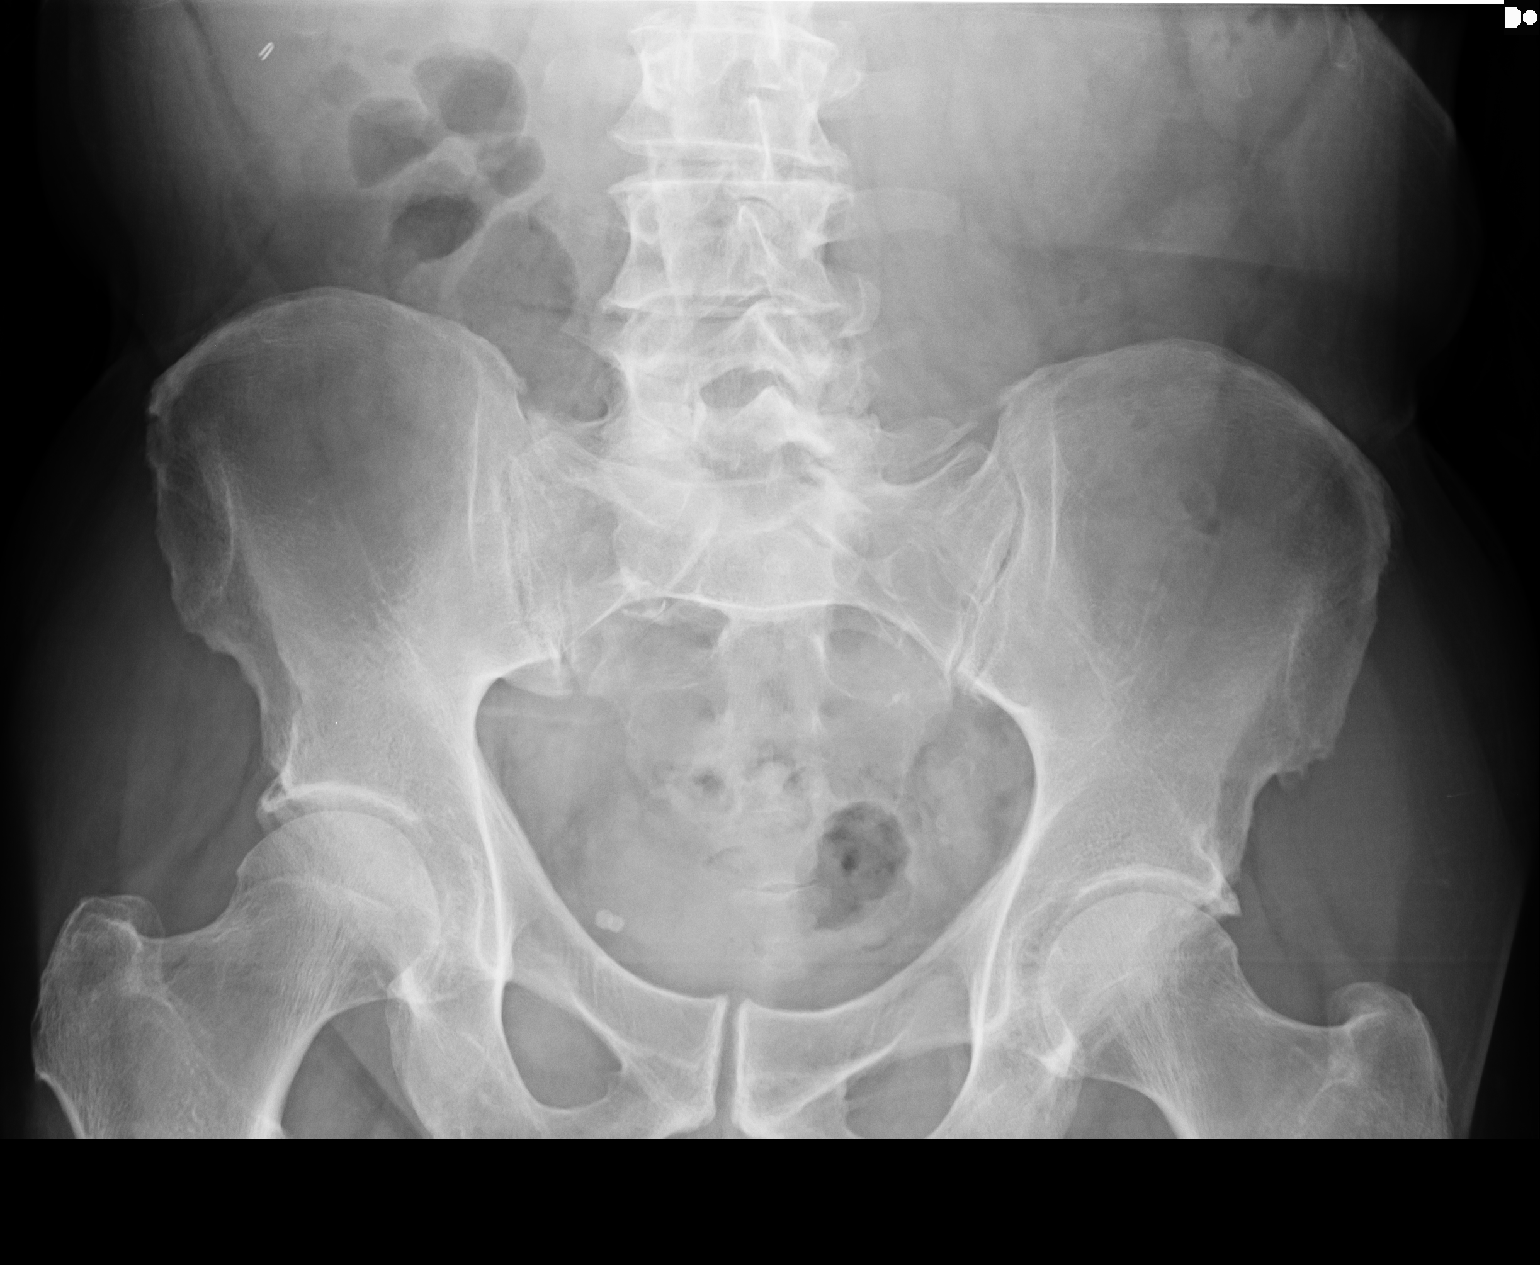
[im 2/3]
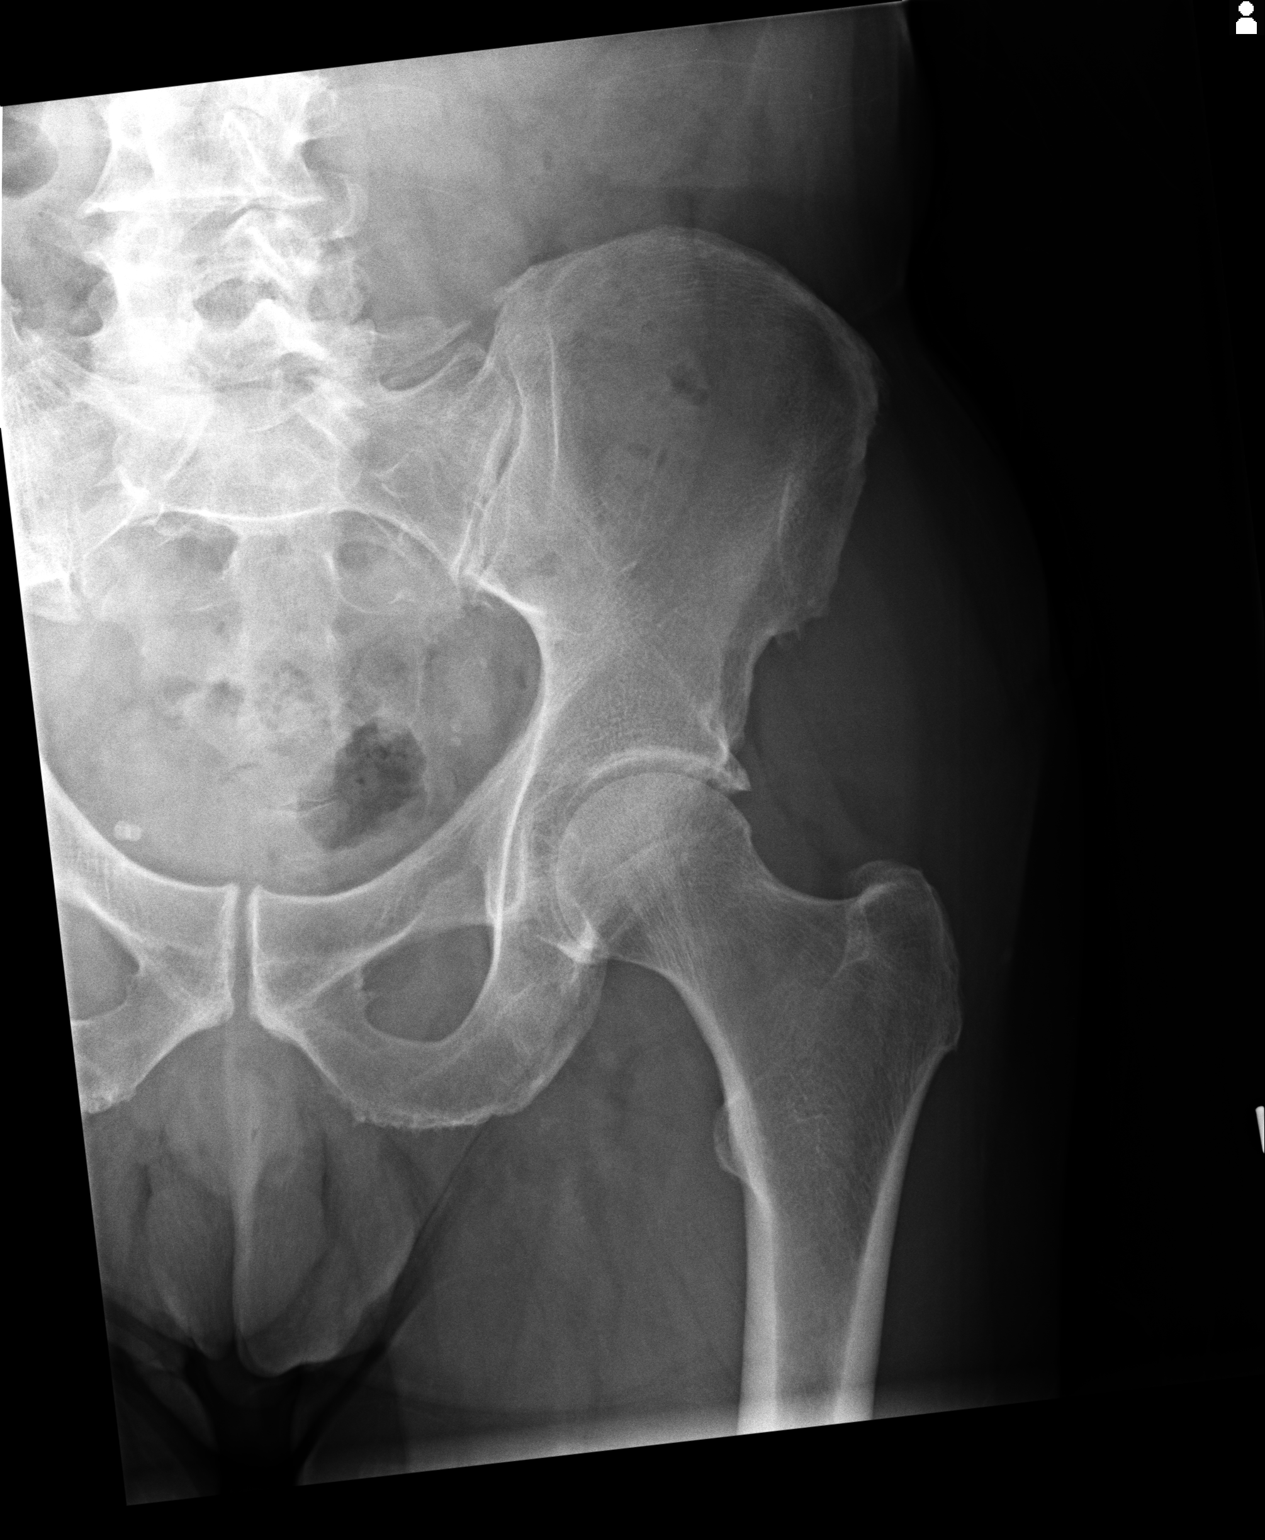
[im 3/3]
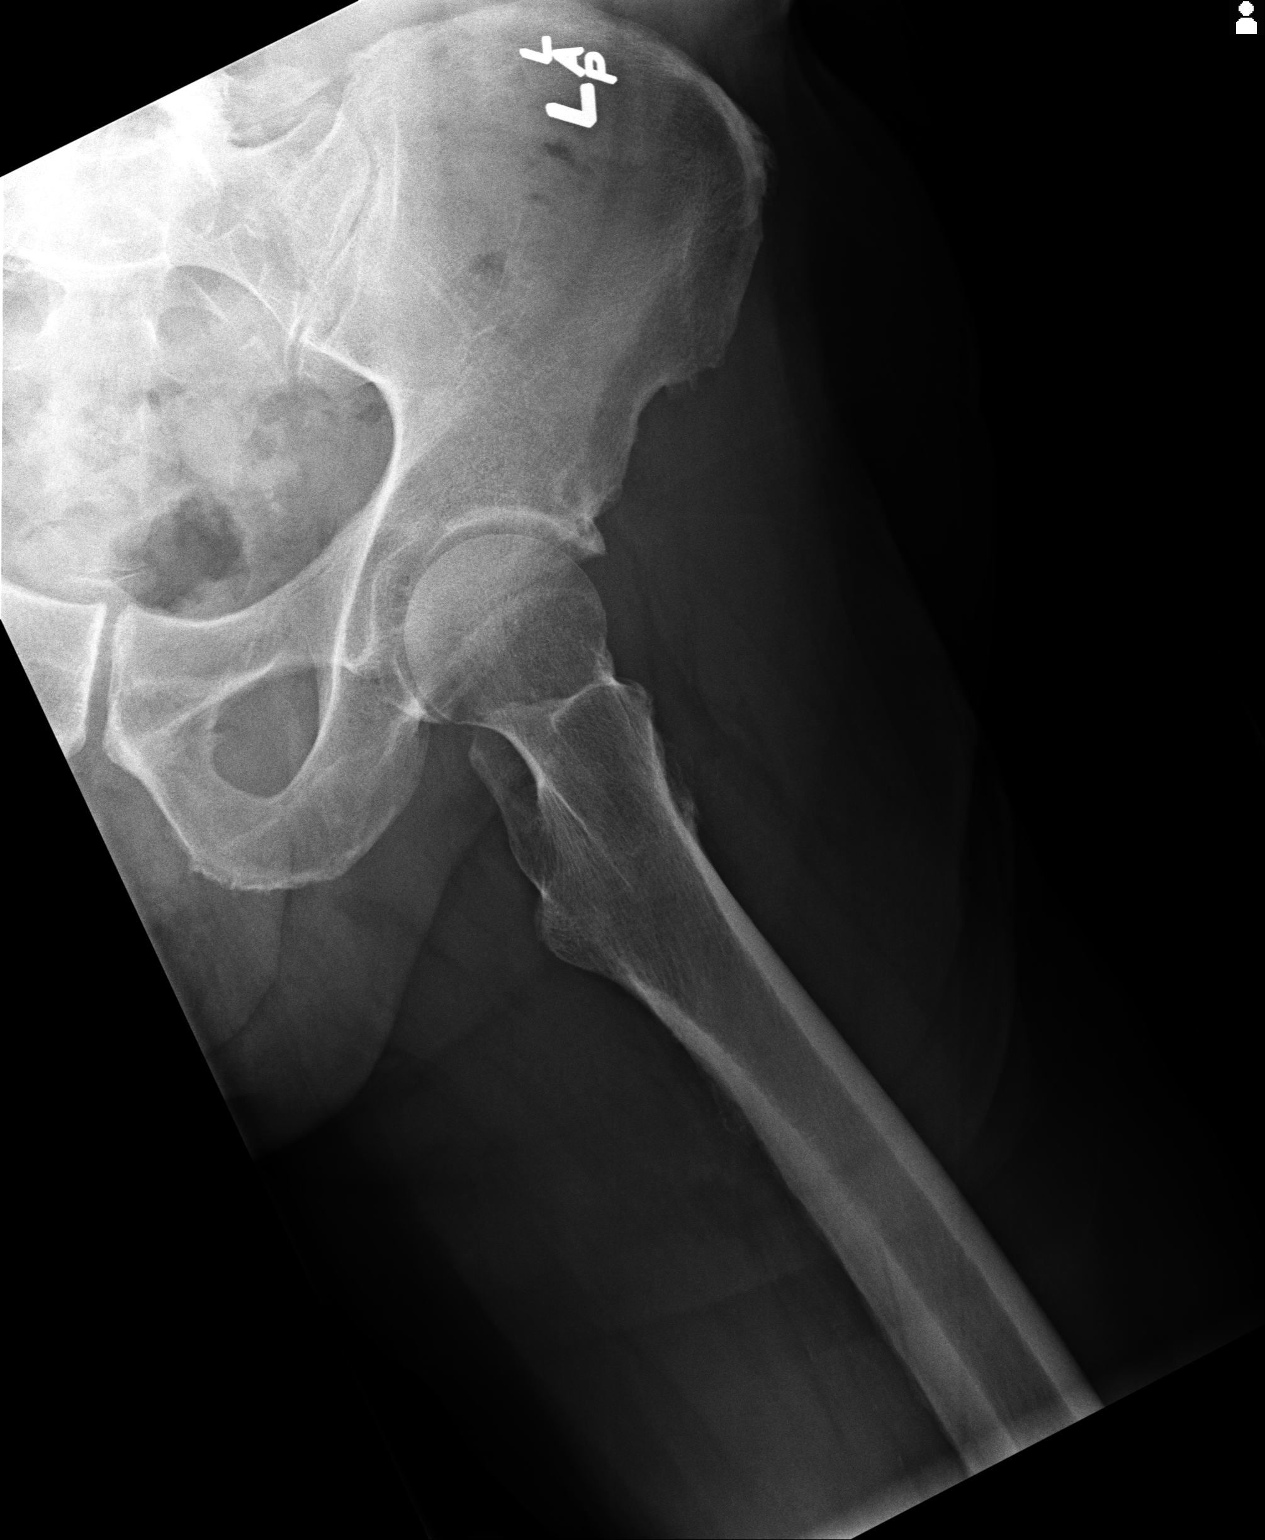

[3 of 3 positions shown; findings below may reference images not displayed]

FINDINGS: There is no evidence of hip fracture or dislocation. There is no
evidence of arthropathy or other focal bone abnormality.
IMPRESSION: Normal left hip.

## 2014-10-20 ENCOUNTER — Encounter (HOSPITAL_COMMUNITY): Payer: Self-pay | Admitting: Cardiovascular Disease

## 2014-11-22 ENCOUNTER — Encounter: Payer: Self-pay | Admitting: Cardiovascular Disease

## 2014-11-22 ENCOUNTER — Ambulatory Visit (INDEPENDENT_AMBULATORY_CARE_PROVIDER_SITE_OTHER): Payer: 59 | Admitting: Cardiovascular Disease

## 2014-11-22 VITALS — BP 144/72 | HR 72 | Ht 69.0 in | Wt 160.6 lb

## 2014-11-22 DIAGNOSIS — I495 Sick sinus syndrome: Secondary | ICD-10-CM

## 2014-11-22 DIAGNOSIS — E785 Hyperlipidemia, unspecified: Secondary | ICD-10-CM

## 2014-11-22 DIAGNOSIS — I10 Essential (primary) hypertension: Secondary | ICD-10-CM

## 2014-11-22 DIAGNOSIS — Z950 Presence of cardiac pacemaker: Secondary | ICD-10-CM

## 2014-11-22 DIAGNOSIS — I471 Supraventricular tachycardia, unspecified: Secondary | ICD-10-CM

## 2014-11-22 DIAGNOSIS — I2510 Atherosclerotic heart disease of native coronary artery without angina pectoris: Secondary | ICD-10-CM

## 2014-11-22 DIAGNOSIS — I480 Paroxysmal atrial fibrillation: Secondary | ICD-10-CM

## 2014-11-22 HISTORY — DX: Paroxysmal atrial fibrillation: I48.0

## 2014-11-22 MED ORDER — ROSUVASTATIN CALCIUM 10 MG PO TABS: 10.0000 mg | ORAL_TABLET | ORAL | Status: DC

## 2014-11-22 MED ORDER — APIXABAN 5 MG PO TABS: 5.0000 mg | ORAL_TABLET | Freq: Two times a day (BID) | ORAL | Status: DC

## 2014-11-22 NOTE — Progress Notes (Signed)
Patient ID: [name], male   DOB: November 07, 1933, 81 y.o.   MRN: [id number]      Reason for office visit Pacemaker check, CAD s/p CABG  Mr. Talarico remains a fit and vibrant older gentleman, only a few weeks away from his 82th birthday. He plays doubles tennis twice (sometimes three times) a week. He does not have exertional angina or dyspnea and denies palpitations, neurological symptoms or bleeding.  He had bypass surgery in 2007 (Dr. Cyndia Bent 2007, LIMA to LAD, SVG to OM, SVG to RCA) and has been asymptomatic since. He had a dual chamber permanent pacemaker implanted in 2007 for symptomatic bradycardia secondary to sinus node dysfunction and had a generator change in 2015 (Medtronic Adapta). He has an underlying sinus rate in the 40s. His device periodically has recorded very brief episodes of atrial tachycardia/possible atrial flutter never longer than 5 minutes in duration. He is never aware of these episodes. Reportedly has a remote history of paroxysmal atrial fibrillation. He typically paces the atrium 80-90% of the time, virtually no ventricular pacing.  Interrogation of his pacemaker today shows he had a 1 hour 20 minute episode of PAFib on November 13 at 11 AM (asymptomatic despite average ventricular rate of 150 bpm). A few episodes of sustained atrial tachycardia with 1:1 conduction have also occurred (up to 17 minutes, also asymptomatic). Otherwise normal pacemaker function. 84% A paced, no V pacing.  He stopped his Crestor due to muscle aches (retrospectively it may have been too much tennis).   Allergies  Allergen Reactions  . Crestor [Rosuvastatin]     myalgias  . Latex Itching    PT STATES THAT AFTER WEARING LATEX GLOVES TO WORK IN THAT HIS HANDS WERE VERY ITCHY.  . Sulfa Antibiotics Rash    Current Outpatient Prescriptions  Medication Sig Dispense Refill  . levothyroxine (SYNTHROID, LEVOTHROID) 137 MCG tablet Take 137 mcg by mouth daily before breakfast.    . metoprolol  succinate (TOPROL XL) 25 MG 24 hr tablet Take 1 tablet (25 mg total) by mouth daily. 30 tablet 10  . NIACIN, ANTIHYPERLIPIDEMIC, PO Take by mouth.    . Vitamins-Lipotropics (LIPO-FLAVONOID PLUS PO) Take 1 tablet by mouth daily.    Marland Kitchen apixaban (ELIQUIS) 5 MG TABS tablet Take 1 tablet (5 mg total) by mouth 2 (two) times daily. 60 tablet 6  . rosuvastatin (CRESTOR) 10 MG tablet Take 1 tablet (10 mg total) by mouth every other day. 16 tablet 6   No current facility-administered medications for this visit.    Past Medical History  Diagnosis Date  . Hyperlipidemia   . Hypothyroidism   . Paroxysmal atrial fibrillation 11/22/2014    Past Surgical History  Procedure Laterality Date  . Placement of permanent pacemaker    . Coronary artery bypass graft      x3 using a left internal mammary artery  . Cardiac catheterization      Dr. Tami Ribas which showed a 95% ostial LAD stenosis  . US echocardiography      lft ventricular ejection fraction was about 60%  . Bilateral carotid duplex ultrasound showed no ica      stenosis  . Pacemaker generator change N/A 08/09/2014    Procedure: PACEMAKER GENERATOR CHANGE [facility];  Surgeon: Sanda Klein, MD;  Location: [facility];  Service: Cardiovascular;  Laterality: N/A;    No family history on file.  History   Social History  . Marital Status: Married    Spouse Name: N/A  Number of Children: N/A  . Years of Education: N/A   Occupational History  . Not on file.   Social History Main Topics  . Smoking status: Never Smoker   . Smokeless tobacco: Not on file  . Alcohol Use: Yes     Comment: occas.  . Drug Use: No  . Sexual Activity: Not on file   Other Topics Concern  . Not on file   Social History Narrative    Review of systems: The patient specifically denies any chest pain at rest or with exertion, dyspnea at rest or with exertion, orthopnea, paroxysmal nocturnal dyspnea, syncope, palpitations, focal neurological deficits,  intermittent claudication, lower extremity edema, unexplained weight gain, cough, hemoptysis or wheezing.  The patient also denies abdominal pain, nausea, vomiting, dysphagia, diarrhea, constipation, polyuria, polydipsia, dysuria, hematuria, frequency, urgency, abnormal bleeding or bruising, fever, chills, unexpected weight changes, mood swings, change in skin or hair texture, change in voice quality, auditory or visual problems, allergic reactions or rashes, new musculoskeletal complaints other than usual "aches and pains".   PHYSICAL EXAM BP 144/72 mmHg  Pulse 72  Ht 5' 9"  (1.753 m)  Wt 160 lb 9.6 oz (72.848 kg)  BMI 23.71 kg/m2 General: Alert, oriented x3, no distress Head: no evidence of trauma, PERRL, EOMI, no exophtalmos or lid lag, no myxedema, no xanthelasma; normal ears, nose and oropharynx Neck: normal jugular venous pulsations and no hepatojugular reflux; brisk carotid pulses without delay and no carotid bruits Chest: clear to auscultation, no signs of consolidation by percussion or palpation, normal fremitus, symmetrical and full respiratory excursions, healthy pacemaker site Cardiovascular: normal position and quality of the apical impulse, regular rhythm, normal first and second heart sounds, no murmurs, rubs or gallops Abdomen: no tenderness or distention, no masses by palpation, no abnormal pulsatility or arterial bruits, normal bowel sounds, no hepatosplenomegaly Extremities: no clubbing, cyanosis or edema; 2+ radial, ulnar and brachial pulses bilaterally; 2+ right femoral, posterior tibial and dorsalis pedis pulses; 2+ left femoral, posterior tibial and dorsalis pedis pulses; no subclavian or femoral bruits Neurological: grossly nonfocal   EKG: A paced V sensed   Lipid Panel  No results found for: CHOL, TRIG, HDL, CHOLHDL, VLDL, LDLCALC, LDLDIRECT  BMET    Component Value Date/Time   NA 141 01/13/2010 0628   K 4.7 08/09/2014 1441   CL 106 01/13/2010 0628   CO2 27  01/13/2010 0628   GLUCOSE 92 01/13/2010 0628   BUN 12 01/13/2010 0628   CREATININE 1.33 01/13/2010 0628   CALCIUM 9.2 01/13/2010 0628   GFRNONAA 53* 01/13/2010 0628   GFRAA  01/13/2010 0628    >60        The eGFR has been calculated using the MDRD equation. This calculation has not been validated in all clinical situations. eGFR's persistently <60 mL/min signify possible Chronic Kidney Disease.     ASSESSMENT AND PLAN Paroxysmal atrial fibrillation Elevated embolic risk due to age, vascular disease and HTN. Discussed need for embolism protection. His wife takes anticoagulants for atrial fibrillation. Discussed warfarin versus NOAC - he prefers latter. Some abnormalities in renal function, although not severe (GFR 43) - prefer Eliquis.  Antiarrhythmics not indicated for infrequent and asymptomatic spells, but may need to increase beta blockers in future due to RVR (he has had his BP meds recently adjusted due to orthostatic dizziness, so will wait until AF burden is more serious).  CAD s/p CABG 2007 He remains asymptomatic despite a very active lifestyle.  Hyperlipidemia I am concerned that  he is no longer on any lipid-lowering therapy. 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 Resume Crestor every other day.  SSS (sick sinus syndrome) Pacemaker - Medtronic Adapta 2015 Normal device function.  HTN (hypertension) Great blood pressure on the current regimen. If he feels dizzy I would advocate stopping the valsartan altogether, it is already in an extremely low dose.   Orders Placed This Encounter  Procedures  . Comp Met (CMET)  . Lipid Profile   Meds ordered this encounter  Medications  . NIACIN, ANTIHYPERLIPIDEMIC, PO    Sig: Take by mouth.  Marland Kitchen apixaban (ELIQUIS) 5 MG TABS tablet    Sig: Take 1 tablet (5 mg total) by mouth 2 (two) times daily.    Dispense:  60 tablet    Refill:  6  . rosuvastatin (CRESTOR) 10 MG tablet    Sig: Take 1  tablet (10 mg total) by mouth every other day.    Dispense:  16 tablet    Refill:  [place] Govani Radloff, MD, [facility] [phone] office [phone] pager

## 2014-11-22 NOTE — Patient Instructions (Addendum)
Your physician has recommended you make the following change in your medication: RESTART CRESTOR 10 MG EVERY OTHER DAY, DISCONTINUE ASPIRIN, START ELIQUIS 5 MG TWICE DAILY  Remote monitoring is used to monitor your pacemaker from home. This monitoring reduces the number of office visits required to check your device to one time per year. It allows us to keep an eye on the functioning of your device to ensure it is working properly. You are scheduled for a device check from home on 02-21-2015. You may send your transmission at any time that day. If you have a wireless device, the transmission will be sent automatically. After your physician reviews your transmission, you will receive a postcard with your next transmission date.  Your physician recommends that you schedule a follow-up appointment in: 6 months with Dr.Croitoru + pacemaker check  Your physician recommends that you return for lab work in: 3 months while fasting

## 2014-11-25 LAB — MDC_IDC_ENUM_SESS_TYPE_INCLINIC
Battery Impedance: 100 Ohm
Battery Remaining Longevity: 164 mo
Battery Voltage: 2.79 V
Brady Statistic AP VP Percent: 0 %
Brady Statistic AS VP Percent: 0 %
Date Time Interrogation Session: 20160112153014
Lead Channel Impedance Value: 548 Ohm
Lead Channel Pacing Threshold Amplitude: 0.75 V
Lead Channel Pacing Threshold Pulse Width: 0.4 ms
Lead Channel Setting Pacing Amplitude: 2 V
Lead Channel Setting Pacing Amplitude: 2.5 V
Lead Channel Setting Pacing Pulse Width: 0.4 ms
MDC IDC MSMT LEADCHNL RA PACING THRESHOLD PULSEWIDTH: 0.4 ms
MDC IDC MSMT LEADCHNL RA SENSING INTR AMPL: 4 mV
MDC IDC MSMT LEADCHNL RV IMPEDANCE VALUE: 626 Ohm
MDC IDC MSMT LEADCHNL RV PACING THRESHOLD AMPLITUDE: 0.5 V
MDC IDC MSMT LEADCHNL RV SENSING INTR AMPL: 15.67 mV
MDC IDC SET LEADCHNL RV SENSING SENSITIVITY: 5.6 mV
MDC IDC STAT BRADY AP VS PERCENT: 84 %
MDC IDC STAT BRADY AS VS PERCENT: 16 %

## 2014-11-29 ENCOUNTER — Telehealth: Payer: Self-pay | Admitting: *Deleted

## 2014-11-29 NOTE — Telephone Encounter (Signed)
Eliquis approved through 11/26/2015 under the Medicare Part D benefit.

## 2015-02-22 ENCOUNTER — Ambulatory Visit (INDEPENDENT_AMBULATORY_CARE_PROVIDER_SITE_OTHER): Payer: Medicare Other | Admitting: *Deleted

## 2015-02-22 DIAGNOSIS — I495 Sick sinus syndrome: Secondary | ICD-10-CM

## 2015-02-22 LAB — MDC_IDC_ENUM_SESS_TYPE_REMOTE
Battery Impedance: 100 Ohm
Battery Voltage: 2.79 V
Brady Statistic AP VP Percent: 0 %
Brady Statistic AS VS Percent: 15 %
Lead Channel Impedance Value: 523 Ohm
Lead Channel Pacing Threshold Amplitude: 0.5 V
Lead Channel Pacing Threshold Pulse Width: 0.4 ms
Lead Channel Pacing Threshold Pulse Width: 0.4 ms
Lead Channel Sensing Intrinsic Amplitude: 11.2 mV
Lead Channel Setting Pacing Amplitude: 2 V
Lead Channel Setting Pacing Amplitude: 2.5 V
Lead Channel Setting Pacing Pulse Width: 0.4 ms
Lead Channel Setting Sensing Sensitivity: 5.6 mV
MDC IDC MSMT BATTERY REMAINING LONGEVITY: 146 mo
MDC IDC MSMT LEADCHNL RA PACING THRESHOLD AMPLITUDE: 0.625 V
MDC IDC MSMT LEADCHNL RV IMPEDANCE VALUE: 601 Ohm
MDC IDC SESS DTM: 20160413112414
MDC IDC STAT BRADY AP VS PERCENT: 85 %
MDC IDC STAT BRADY AS VP PERCENT: 0 %

## 2015-02-23 NOTE — Progress Notes (Signed)
Remote pacemaker transmission.   

## 2015-02-28 ENCOUNTER — Encounter: Payer: Self-pay | Admitting: Cardiovascular Disease

## 2015-03-08 ENCOUNTER — Encounter: Payer: Self-pay | Admitting: Cardiology

## 2015-03-10 ENCOUNTER — Encounter: Payer: Self-pay | Admitting: Cardiovascular Disease

## 2015-03-13 ENCOUNTER — Telehealth: Payer: Self-pay | Admitting: Cardiovascular Disease

## 2015-03-14 NOTE — Telephone Encounter (Signed)
Close encounter 

## 2015-05-08 ENCOUNTER — Other Ambulatory Visit: Payer: Self-pay | Admitting: Cardiovascular Disease

## 2015-05-10 ENCOUNTER — Encounter (INDEPENDENT_AMBULATORY_CARE_PROVIDER_SITE_OTHER): Payer: Self-pay

## 2015-05-10 ENCOUNTER — Encounter: Payer: Self-pay | Admitting: Internal Medicine

## 2015-05-10 ENCOUNTER — Inpatient Hospital Stay: Payer: Medicare Other | Attending: Internal Medicine | Admitting: Internal Medicine

## 2015-05-10 ENCOUNTER — Inpatient Hospital Stay: Payer: Medicare Other

## 2015-05-10 VITALS — BP 156/89 | HR 87 | Temp 98.4°F | Resp 17 | Ht 69.0 in | Wt 149.2 lb

## 2015-05-10 DIAGNOSIS — D696 Thrombocytopenia, unspecified: Secondary | ICD-10-CM | POA: Insufficient documentation

## 2015-05-10 DIAGNOSIS — M1990 Unspecified osteoarthritis, unspecified site: Secondary | ICD-10-CM | POA: Diagnosis not present

## 2015-05-10 DIAGNOSIS — Z79899 Other long term (current) drug therapy: Secondary | ICD-10-CM | POA: Insufficient documentation

## 2015-05-10 DIAGNOSIS — E039 Hypothyroidism, unspecified: Secondary | ICD-10-CM

## 2015-05-10 DIAGNOSIS — Z7901 Long term (current) use of anticoagulants: Secondary | ICD-10-CM | POA: Insufficient documentation

## 2015-05-10 DIAGNOSIS — I2510 Atherosclerotic heart disease of native coronary artery without angina pectoris: Secondary | ICD-10-CM | POA: Diagnosis not present

## 2015-05-10 DIAGNOSIS — Z951 Presence of aortocoronary bypass graft: Secondary | ICD-10-CM | POA: Insufficient documentation

## 2015-05-10 DIAGNOSIS — I10 Essential (primary) hypertension: Secondary | ICD-10-CM | POA: Insufficient documentation

## 2015-05-10 DIAGNOSIS — I480 Paroxysmal atrial fibrillation: Secondary | ICD-10-CM | POA: Insufficient documentation

## 2015-05-10 DIAGNOSIS — E785 Hyperlipidemia, unspecified: Secondary | ICD-10-CM | POA: Insufficient documentation

## 2015-05-10 DIAGNOSIS — Z950 Presence of cardiac pacemaker: Secondary | ICD-10-CM | POA: Diagnosis not present

## 2015-05-10 LAB — RETICULOCYTES
RBC.: 5.09 MIL/uL (ref 4.40–5.90)
Retic Count, Absolute: 30.5 10*3/uL (ref 19.0–183.0)
Retic Ct Pct: 0.6 % (ref 0.4–3.1)

## 2015-05-10 LAB — CBC
HEMATOCRIT: 46.4 % (ref 40.0–52.0)
Hemoglobin: 15.1 g/dL (ref 13.0–18.0)
MCH: 29.6 pg (ref 26.0–34.0)
MCHC: 32.5 g/dL (ref 32.0–36.0)
MCV: 91.2 fL (ref 80.0–100.0)
Platelets: 123 10*3/uL — ABNORMAL LOW (ref 150–440)
RBC: 5.09 MIL/uL (ref 4.40–5.90)
RDW: 13.4 % (ref 11.5–14.5)
WBC: 6.4 10*3/uL (ref 3.8–10.6)

## 2015-05-10 LAB — APTT: aPTT: 30 seconds (ref 24–36)

## 2015-05-10 LAB — PROTIME-INR
INR: 1.13
Prothrombin Time: 14.7 seconds (ref 11.4–15.0)

## 2015-05-10 LAB — IRON AND TIBC
Iron: 62 ug/dL (ref 45–182)
Saturation Ratios: 17 % — ABNORMAL LOW (ref 17.9–39.5)
TIBC: 365 ug/dL (ref 250–450)
UIBC: 303 ug/dL

## 2015-05-10 LAB — FIBRINOGEN: Fibrinogen: 403 mg/dL (ref 210–470)

## 2015-05-10 LAB — FOLATE: Folate: 24 ng/mL (ref >5.9)

## 2015-05-10 LAB — VITAMIN B12: VITAMIN B 12: 377 pg/mL (ref 180–914)

## 2015-05-10 LAB — FERRITIN: Ferritin: 120 ng/mL (ref 24–336)

## 2015-05-11 LAB — PROTEIN ELECTROPHORESIS, SERUM
A/G Ratio: 1.2 (ref 0.7–1.7)
Albumin ELP: 3.9 g/dL (ref 2.9–4.4)
Alpha-1-Globulin: 0.3 g/dL (ref 0.0–0.4)
Alpha-2-Globulin: 0.8 g/dL (ref 0.4–1.0)
Beta Globulin: 1 g/dL (ref 0.7–1.3)
GAMMA GLOBULIN: 1.2 g/dL (ref 0.4–1.8)
Globulin, Total: 3.3 g/dL (ref 2.2–3.9)
Total Protein ELP: 7.2 g/dL (ref 6.0–8.5)

## 2015-05-11 LAB — HEPATITIS B SURFACE ANTIGEN: Hepatitis B Surface Ag: NEGATIVE

## 2015-05-11 LAB — PLATELET ANTIBODY PROFILE, SERUM
HLA AB SER QL EIA: NEGATIVE
IA/IIA Antibody: NEGATIVE
IB/IX Antibody: NEGATIVE
IIB/IIIA ANTIBODY: NEGATIVE

## 2015-05-11 LAB — HIV ANTIBODY (ROUTINE TESTING W REFLEX): HIV Screen 4th Generation wRfx: NONREACTIVE

## 2015-05-12 LAB — HAPTOGLOBIN: Haptoglobin: 116 mg/dL (ref 34–200)

## 2015-05-12 LAB — KAPPA/LAMBDA LIGHT CHAINS
KAPPA FREE LGHT CHN: 14.35 mg/L (ref 3.30–19.40)
KAPPA, LAMDA LIGHT CHAIN RATIO: 1.41 (ref 0.26–1.65)
Lambda free light chains: 10.16 mg/L (ref 5.71–26.30)

## 2015-05-17 ENCOUNTER — Inpatient Hospital Stay: Payer: Medicare Other | Attending: Internal Medicine | Admitting: Internal Medicine

## 2015-05-17 VITALS — BP 150/77 | HR 86 | Temp 98.5°F | Wt 153.3 lb

## 2015-05-17 DIAGNOSIS — D696 Thrombocytopenia, unspecified: Secondary | ICD-10-CM | POA: Diagnosis not present

## 2015-05-17 DIAGNOSIS — I2510 Atherosclerotic heart disease of native coronary artery without angina pectoris: Secondary | ICD-10-CM | POA: Diagnosis not present

## 2015-05-17 DIAGNOSIS — E039 Hypothyroidism, unspecified: Secondary | ICD-10-CM | POA: Insufficient documentation

## 2015-05-17 DIAGNOSIS — Z950 Presence of cardiac pacemaker: Secondary | ICD-10-CM | POA: Diagnosis not present

## 2015-05-17 DIAGNOSIS — Z79899 Other long term (current) drug therapy: Secondary | ICD-10-CM | POA: Diagnosis not present

## 2015-05-17 DIAGNOSIS — Z7901 Long term (current) use of anticoagulants: Secondary | ICD-10-CM | POA: Diagnosis not present

## 2015-05-17 DIAGNOSIS — M1990 Unspecified osteoarthritis, unspecified site: Secondary | ICD-10-CM | POA: Diagnosis not present

## 2015-05-17 DIAGNOSIS — M25552 Pain in left hip: Secondary | ICD-10-CM | POA: Insufficient documentation

## 2015-05-17 DIAGNOSIS — I10 Essential (primary) hypertension: Secondary | ICD-10-CM | POA: Diagnosis not present

## 2015-05-17 DIAGNOSIS — E785 Hyperlipidemia, unspecified: Secondary | ICD-10-CM | POA: Diagnosis not present

## 2015-05-17 DIAGNOSIS — G8929 Other chronic pain: Secondary | ICD-10-CM | POA: Insufficient documentation

## 2015-05-17 DIAGNOSIS — Z951 Presence of aortocoronary bypass graft: Secondary | ICD-10-CM | POA: Diagnosis not present

## 2015-05-17 NOTE — Progress Notes (Signed)
Patient states he is feeling better, has more energy.

## 2015-05-19 ENCOUNTER — Other Ambulatory Visit: Payer: Self-pay | Admitting: Internal Medicine

## 2015-05-19 ENCOUNTER — Ambulatory Visit
Admission: RE | Admit: 2015-05-19 | Discharge: 2015-05-19 | Disposition: A | Discharge status: Home / Self Care | Payer: Medicare Other | Source: Ambulatory Visit | Attending: Internal Medicine | Admitting: Internal Medicine

## 2015-05-19 DIAGNOSIS — D696 Thrombocytopenia, unspecified: Secondary | ICD-10-CM

## 2015-05-19 DIAGNOSIS — K7689 Other specified diseases of liver: Secondary | ICD-10-CM | POA: Insufficient documentation

## 2015-05-19 NOTE — Progress Notes (Signed)
[facility]  Telephone:(336) [phone] Fax:(336) [phone]     ID: [name] OB: 07-20-1933  MR#: [id number]  YHC#:[id number]  Patient Care Team: Lavera Guise, MD as PCP - General (Internal Medicine)  CHIEF COMPLAINT/DIAGNOSIS:  Thrombocytopenia of unclear etiology (Labs on 04/18/2015 showed platelet count below normal range at 144 with creatinine of 1.15, calcium of 9.5. Platelet count in December 2015 was 144, hemoglobin 14.6, WBC 4700 with unremarkable differential, creatinine 1.20. In April 2016, hemoglobin 15.7, WBC 5300 with unremarkable differential, platelets low at 131, creatinine 1.16).  -  Patient referred here for hematology evaluation and management.  HISTORY OF PRESENT ILLNESS:  [name] is a 81 year old gentleman with past medical history significant for paroxysmal atrial fibrillation, hypertension, hyperlipidemia, hypothyroidism, arthritis, coronary artery disease status post bypass surgery, status post permanent pacemaker who has been referred here for persistent thrombocytopenia of unclear etiology. Platelet count in December 2015 was 144, hemoglobin 14.6, WBC 4700 with unremarkable differential, creatinine 1.20. In April 2016, hemoglobin 15.7, WBC 5300 with unremarkable differential, platelets low at 131, creatinine 1.16. More recently on 04/18/2015 platelet count again was below normal range at 144 with creatinine of 1.15, calcium of 9.5. Patient clinically states that he is quite active for his age, he exercises fairly regularly and plays tennis about 3 times a week. States that he did notice some red spots on both legs about 2 weeks ago but these have currently resolved. He denies any other bleeding symptoms including recurrent epistaxis, gum bleeding, hemoptysis, hematemesis, Molina, bright red blood in stools or hematuria. Denies any known history of chronic liver disease or splenomegaly. Denies fevers or night sweats. Appetite is good, no unintentional weight  loss. No new bone pains. No new paresthesias in extremities. States that he does eat a balanced diet.  REVIEW OF SYSTEMS:   ROS CONSTITUTIONAL: As in HPI above. No chills, fever or sweats.    ENT:  No headache, dizziness or epistaxis. No ear or jaw pain. Chronic hearing changes. No sinus symptoms. RESPIRATORY:   No cough.  No shortness of breath. No wheezing. No hemoptysis. CARDIAC:  Currently denies palpitation, chest pain. No orthopnea, PND. GI:  No abdominal pain, nausea or vomiting. No diarrhea. History of hemorrhoids, no major bleeding.   GU:  No dysuria or hematuria.  SKIN: Currently no rashes or pruritus. HEMATOLOGIC: As in history of present illness MUSCULOSKELETAL:  No new bone pains.  EXTREMITY:  No new swelling or pain.  NEURO:  No focal weakness. No numbness or tingling of extremities.  No seizures.   ENDOCRINE:  No polyuria or polydipsia.   PAST MEDICAL HISTORY: Reviewed. Past Medical History  Diagnosis Date  . Hyperlipidemia   . Hypothyroidism   . Paroxysmal atrial fibrillation 11/22/2014  . Hypertension   . Arthritis     PAST SURGICAL HISTORY: Reviewed. Past Surgical History  Procedure Laterality Date  . Placement of permanent pacemaker    . Coronary artery bypass graft      x3 using a left internal mammary artery  . Cardiac catheterization      Dr. Tami Ribas which showed a 95% ostial LAD stenosis  . US echocardiography      lft ventricular ejection fraction was about 60%  . Bilateral carotid duplex ultrasound showed no ica      stenosis  . Pacemaker generator change N/A 08/09/2014    Procedure: PACEMAKER GENERATOR CHANGE [facility] Sp[facility];  Surgeon: Sanda Klein, MD;  Location: [facility];  Service: Cardiovascular;  Laterality:  N/A;    FAMILY HISTORY: Reviewed. Family History  Problem Relation Age of Onset  . Hypertension Mother   . Stroke Mother     SOCIAL HISTORY: Reviewed. History  Substance Use Topics  . Smoking status: Never Smoker   . Smokeless  tobacco: Not on file  . Alcohol Use: Yes     Comment: occas.    Allergies  Allergen Reactions  . Crestor [Rosuvastatin]     myalgias  . Latex Itching    PT STATES THAT AFTER WEARING LATEX GLOVES TO WORK IN THAT HIS HANDS WERE VERY ITCHY.  . Sulfa Antibiotics Rash    Current Outpatient Prescriptions  Medication Sig Dispense Refill  . CRESTOR 10 MG tablet TAKE ONE TABLET BY MOUTH EVERY OTHER DAY 15 tablet 1  . ELIQUIS 5 MG TABS tablet TAKE ONE TABLET BY MOUTH TWICE DAILY 60 tablet 1  . levothyroxine (SYNTHROID, LEVOTHROID) 137 MCG tablet Take 137 mcg by mouth daily before breakfast.    . metoprolol succinate (TOPROL XL) 25 MG 24 hr tablet Take 1 tablet (25 mg total) by mouth daily. 30 tablet 10  . Vitamins-Lipotropics (LIPO-FLAVONOID PLUS PO) Take 1 tablet by mouth daily.     No current facility-administered medications for this visit.    PHYSICAL EXAM: Filed Vitals:   05/10/15 1355  BP: 156/89  Pulse: 87  Temp: 98.4 F (36.9 C)  Resp: 17     Body mass index is 22.03 kg/(m^2).     GENERAL: Patient is alert and oriented and in no acute distress. There is no icterus. HEENT: EOMs intact. Oral exam negative for thrush or lesions. No cervical lymphadenopathy. CVS: S1S2, regular LUNGS: Bilaterally clear to auscultation, no rhonchi. ABDOMEN: Soft, nontender. No hepatosplenomegaly clinically.  NEURO: grossly nonfocal, cranial nerves are intact. Gait unremarkable. EXTREMITIES: No pedal edema. SKIN: No rashes, petechiae or major bruising MUSCULOSKELETAL: No obvious joint swelling or redness LYMPHATICS: No palpable adenopathy in axillary or inguinal areas.   LAB RESULTS: 04/18/15 - platelet count below normal range at 144 with creatinine of 1.15, calcium of 9.5.  April 2016 - hemoglobin 15.7, WBC 5300 with unremarkable differential, platelets low at 131, creatinine 1.16. Dec 2015 - platelet count 144, hemoglobin 14.6, WBC 4700 with unremarkable differential, creatinine  1.20.  STUDIES:   ASSESSMENT / PLAN:   Thrombocytopenia of unclear etiology  (Labs on 04/18/15 showed platelet count below normal range at 144 with creatinine of 1.15, calcium of 9.5. Platelet count in December 2015 was 144, hemoglobin 14.6, WBC 4700 with unremarkable differential, creatinine 1.20. In April 2016, hemoglobin 15.7, WBC 5300 with unremarkable differential, platelets low at 131, creatinine 1.16) -  Patient referred here for hematology evaluation and management. Have reviewed records and labs sent by the referring physician and discussed with patient and family present in detail. He does have mild persistent thrombocytopenia of unclear etiology. Patient does not have history of alcohol intake, liver disease or splenomegaly. He did have what would be a petechial rash on his legs 2 weeks ago but this is currently improved and he otherwise does not have any bleeding symptoms clinically. Patient will need further workup for thrombocytopenia to try and define an etiology, and will send labs for CBC, manual differential, reticulocyte count, iron study including ferritin/iron/TIBC, B12 and folate level, LDH, haptoglobin, HBsAg, HIV antibody, HCV antibody, platelet antibody test, PT/PTT/fibrinogen, SPEP to rule out plasma cell dyscrasia. Will pursue ultrasound of the liver/spleen to look for hepatosplenomegaly or cirrhosis of the liver. Will see him  back in 1-2 weeks and make further plan of management based upon results of above workup, including decide if he will need bone marrow biopsy procedure.  In between visits, the patient has been advised to call or come to ER in case of fevers, bleeding, acute sickness or new symptoms. Patient is agreeable to this plan.    Leia Alf, MD   05/19/2015 9:47 AM

## 2015-05-19 NOTE — Progress Notes (Signed)
[name]  Telephone:(336) [phone] Fax:(336) [phone]     ID: [name] OB: Mar 14, 1933  MR#: [id number]  CLE#:[id number]  Patient Care Team: Lavera Guise, MD as PCP - General (Internal Medicine)  CHIEF COMPLAINT/DIAGNOSIS:  Thrombocytopenia of unclear etiology  -  ? Mild ITP vs other etiology. (Labs on 04/18/15 showed platelet count below normal range at 144 with creatinine of 1.15, calcium of 9.5. Platelet count in December 2015 was 144, hemoglobin 14.6, WBC 4700 with unremarkable differential, creatinine 1.20. In April 2016, hemoglobin 15.7, WBC 5300 with unremarkable differential, platelets low at 131, creatinine 1.16).   Workup done on 05/10/15 - Hb 15.1, platelets 123, WBC 6400, ferritin 120, serum iron 62, iron saturation slightly low at 17%. Otherwise serum B-12, folate, SPEP, kappa/lambda ratio (1.4), PT, PTT, HIV antibody, HBsAg, platelet antibody test all unremarkable      HISTORY OF PRESENT ILLNESS:  Patient is continued hematology follow-up, he had workup done on June 29 as described above. Clinically states that he continues to do fairly steady. He does complain of persistent left hip pain. Otherwise no new bone pains or back pain. He denies any recurrent skin rashes or bruising. He denies any other bleeding symptoms including recurrent epistaxis, gum bleeding, hemoptysis, hematemesis, Molina, bright red blood in stools or hematuria. Denies fevers or night sweats. Appetite is good.  REVIEW OF SYSTEMS:   Review of Systems  Constitutional: Negative.   HENT: Negative.   Gastrointestinal: Negative.   Genitourinary: Negative.   Musculoskeletal: Positive for joint pain.  Skin: Negative.   Neurological: Negative.     PAST MEDICAL HISTORY: Reviewed. Past Medical History  Diagnosis Date  . Hyperlipidemia   . Hypothyroidism   . Paroxysmal atrial fibrillation 11/22/2014  . Hypertension   . Arthritis     PAST SURGICAL HISTORY: Reviewed. Past Surgical History   Procedure Laterality Date  . Placement of permanent pacemaker    . Coronary artery bypass graft      x3 using a left internal mammary artery  . Cardiac catheterization      Dr. Tami Ribas which showed a 95% ostial LAD stenosis  . US echocardiography      lft ventricular ejection fraction was about 60%  . Bilateral carotid duplex ultrasound showed no ica      stenosis  . Pacemaker generator change N/A 08/09/2014    Procedure: PACEMAKER GENERATOR CHANGE [facility] Spine;  Surgeon: Sanda Klein, MD;  Location: [facility];  Service: Cardiovascular;  Laterality: N/A;    FAMILY HISTORY: Reviewed. Family History  Problem Relation Age of Onset  . Hypertension Mother   . Stroke Mother     SOCIAL HISTORY: Reviewed. History  Substance Use Topics  . Smoking status: Never Smoker   . Smokeless tobacco: Not on file  . Alcohol Use: Yes     Comment: occas.    Allergies  Allergen Reactions  . Crestor [Rosuvastatin]     myalgias  . Latex Itching    PT STATES THAT AFTER WEARING LATEX GLOVES TO WORK IN THAT HIS HANDS WERE VERY ITCHY.  . Sulfa Antibiotics Rash    Current Outpatient Prescriptions  Medication Sig Dispense Refill  . CRESTOR 10 MG tablet TAKE ONE TABLET BY MOUTH EVERY OTHER DAY 15 tablet 1  . ELIQUIS 5 MG TABS tablet TAKE ONE TABLET BY MOUTH TWICE DAILY 60 tablet 1  . levothyroxine (SYNTHROID, LEVOTHROID) 137 MCG tablet Take 137 mcg by mouth daily before breakfast.    . metoprolol succinate (  TOPROL XL) 25 MG 24 hr tablet Take 1 tablet (25 mg total) by mouth daily. 30 tablet 10  . Vitamins-Lipotropics (LIPO-FLAVONOID PLUS PO) Take 1 tablet by mouth daily.     No current facility-administered medications for this visit.    PHYSICAL EXAM: Filed Vitals:   05/17/15 1605  BP: 150/77  Pulse: 86  Temp: 98.5 F (36.9 C)     Body mass index is 22.63 kg/(m^2).     GENERAL: Patient is alert and oriented and in no acute distress. There is no icterus. HEENT: EOMs intact. Oral exam  negative for thrush or petechiae. No cervical lymphadenopathy. LUNGS: Bilaterally clear to auscultation, no rhonchi. ABDOMEN: Soft, nontender. No hepatosplenomegaly clinically.  EXTREMITIES: No pedal edema. SKIN: No rashes, petechiae or major bruising LYMPHATICS: No palpable adenopathy in axillary or inguinal areas.   LAB RESULTS: 04/18/15 - platelet count below normal range at 144 with creatinine of 1.15, calcium of 9.5.  April 2016 - hemoglobin 15.7, WBC 5300 with unremarkable differential, platelets low at 131, creatinine 1.16. Dec 2015 - platelet count 144, hemoglobin 14.6, WBC 4700 with unremarkable differential, creatinine 1.20.  STUDIES:   ASSESSMENT / PLAN:   Thrombocytopenia of unclear etiology  (Labs on 04/18/15 showed platelet count below normal range at 144 with creatinine of 1.15, calcium of 9.5. Platelet count in December 2015 was 144, hemoglobin 14.6, WBC 4700 with unremarkable differential, creatinine 1.20. In April 2016, hemoglobin 15.7, WBC 5300 with unremarkable differential, platelets low at 131, creatinine 1.16). Workup done on 05/10/2015-hemoglobin 15.1, platelets 123, WBC 6400, ferritin 120, serum iron 62, iron saturation slightly low at 17%. Otherwise serum B-12, folate, SPEP, kappa/lambda ratio (1.4), PT, PTT, HIV antibody, HBsAg, platelet antibody test all unremarkable   -   have reviewed workup done on June 29 and discussed with patient and family present in detail. He continues to have mild thrombocytopenia which is persistent, possibly could be ITP (admitted thrombocytopenic purpura) versus other etiology. Ultrasound abdomen is still pending. He also has a left hip pain and will request left hip x-ray when he goes for ultrasound. Also had explained that it is difficult to rule out possibility of underlying bone marrow disorder like early myelodysplastic syndrome. If ultrasound is unremarkable, patient wants to pursue bone marrow biopsy procedure and have explained  details regarding this. Will set up a bone marrow biopsy for next week unless ultrasound shows any obvious chronic liver disease or splenomegaly. Patient otherwise does not have any bleeding issues at this time and does not need any immediate treatment. For slightly low iron saturation, have advised him to take ferrous sulfate 325 mg tablet once daily.  In between visits, the patient has been advised to call or come to ER in case of fevers, bleeding, acute sickness or new symptoms. Patient is agreeable to this plan.    Leia Alf, MD   05/19/2015 10:08 AM

## 2015-05-23 ENCOUNTER — Ambulatory Visit (INDEPENDENT_AMBULATORY_CARE_PROVIDER_SITE_OTHER): Payer: Medicare Other | Admitting: Cardiovascular Disease

## 2015-05-23 ENCOUNTER — Encounter: Payer: Self-pay | Admitting: Cardiovascular Disease

## 2015-05-23 VITALS — BP 158/82 | HR 81 | Ht 69.0 in | Wt 152.0 lb

## 2015-05-23 DIAGNOSIS — E785 Hyperlipidemia, unspecified: Secondary | ICD-10-CM

## 2015-05-23 DIAGNOSIS — R0789 Other chest pain: Secondary | ICD-10-CM | POA: Diagnosis not present

## 2015-05-23 DIAGNOSIS — I495 Sick sinus syndrome: Secondary | ICD-10-CM

## 2015-05-23 DIAGNOSIS — I480 Paroxysmal atrial fibrillation: Secondary | ICD-10-CM

## 2015-05-23 DIAGNOSIS — I2510 Atherosclerotic heart disease of native coronary artery without angina pectoris: Secondary | ICD-10-CM

## 2015-05-23 DIAGNOSIS — D696 Thrombocytopenia, unspecified: Secondary | ICD-10-CM

## 2015-05-23 HISTORY — DX: Thrombocytopenia, unspecified: D69.6

## 2015-05-23 LAB — CUP PACEART INCLINIC DEVICE CHECK
Battery Remaining Longevity: 148 mo
Brady Statistic AP VP Percent: 0 %
Brady Statistic AP VS Percent: 86 %
Brady Statistic AS VS Percent: 14 %
Date Time Interrogation Session: 20160712130720
Lead Channel Pacing Threshold Amplitude: 0.625 V
Lead Channel Sensing Intrinsic Amplitude: 11.2 mV
Lead Channel Setting Pacing Amplitude: 2 V
MDC IDC MSMT BATTERY IMPEDANCE: 100 Ohm
MDC IDC MSMT BATTERY VOLTAGE: 2.79 V
MDC IDC MSMT LEADCHNL RA IMPEDANCE VALUE: 541 Ohm
MDC IDC MSMT LEADCHNL RA PACING THRESHOLD PULSEWIDTH: 0.4 ms
MDC IDC MSMT LEADCHNL RV IMPEDANCE VALUE: 601 Ohm
MDC IDC MSMT LEADCHNL RV PACING THRESHOLD AMPLITUDE: 0.5 V
MDC IDC MSMT LEADCHNL RV PACING THRESHOLD PULSEWIDTH: 0.4 ms
MDC IDC SET LEADCHNL RV PACING AMPLITUDE: 2.5 V
MDC IDC SET LEADCHNL RV PACING PULSEWIDTH: 0.4 ms
MDC IDC SET LEADCHNL RV SENSING SENSITIVITY: 4 mV
MDC IDC STAT BRADY AS VP PERCENT: 0 %

## 2015-05-23 NOTE — Patient Instructions (Signed)
Medication Instructions:   NONE    Labwork:  NONE  Testing/Procedures:  Your physician has requested that you have an exercise tolerance test. For further information please visit [email or web address]. Please also follow instruction sheet, as given.    Follow-Up:  Remote monitoring is used to monitor your Pacemaker or ICD from home. This monitoring reduces the number of office visits required to check your device to one time per year. It allows us to monitor the functioning of your device to ensure it is working properly. You are scheduled for a device check from home on August 24, 2015. You may send your transmission at any time that day. If you have a wireless device, the transmission will be sent automatically. After your physician reviews your transmission, you will receive a postcard with your next transmission date.  Dr. Royann Shiversroitoru recommends that you schedule a follow-up appointment in: ONE YEAR

## 2015-05-23 NOTE — Progress Notes (Signed)
Patient ID: CARLSON BELLAND, male   DOB: 1933-01-04, 81 y.o.   MRN: [id number]     Cardiology Office Note   Date:  05/23/2015   ID:  TARRENCE ENCK, DOB 04/24/1933, MRN [id number]  PCP:  Lavera Guise, MD  Cardiologist:   Sanda Klein, MD   Chief Complaint  Patient presents with  . 6 months    Patient has had chest pressure.      History of Present Illness: LEELYNN WHETSEL is a 81 y.o. male who presents for sinus node dysfunction status post pacemaker, paroxysmal atrial fibrillation, CAD s/p CABG in 2007.  Mr. Daversa feels great. Until a few weeks ago he was playing tennis, but as always he stops during the heat of the summer months. Unfortunately he was recently diagnosed with thrombocytopenia and has an appointment to see a hematologist and probably undergo bone marrow biopsy later this week. He has not had bleeding complications. He is taking Eliquis for atrial fibrillation without events. The labs that I have available for review show only very mild thrombus cytopenia with a platelet count of around 120,000.  He never has complaints of chest discomfort while playing tennis, but the summer has noticed a little bit of discomfort working in the yard. This is inconsistent and resolves promptly with rest. He has not had any chest discomfort at rest. He does not recall the exact symptoms that led to his bypass procedure and is uncertain whether the current chest discomfort is similar.  Pacemaker interrogation shows normal device function with estimated generator longevity of 12.5 years. Presenting rhythm is atrial paced ventricular sensed (counters showed 86% atrial pacing and only 0.1% ventricular pacing). The overall burden of atrial fibrillation has been 0.2% with the longest episode being the one hour event in June. During atrial fibrillation he has excellent rate control. He is never really aware of the arrhythmia. He has not had stroke or TIA. His blood pressure is slightly elevated today but this is  quite atypical for him.  He had bypass surgery in 2007 (Dr. Cyndia Bent 2007, LIMA to LAD, SVG to OM, SVG to RCA) and has been asymptomatic since. He had a dual chamber permanent pacemaker implanted in 2007 for symptomatic bradycardia secondary to sinus node dysfunction and had a generator change in 2015 (Medtronic Adapta). He has an underlying sinus rate in the 40s. His device has recorded brief episodes of paroxysmal atrial fibrillation. He is not aware of these episodes. Reportedly, has a remote history of paroxysmal atrial fibrillation. He typically paces the atrium 80-90% of the time, virtually no ventricular pacing.  Past Medical History  Diagnosis Date  . Hyperlipidemia   . Hypothyroidism   . Paroxysmal atrial fibrillation 11/22/2014  . Hypertension   . Arthritis   . Thrombocytopenia 05/23/2015    Past Surgical History  Procedure Laterality Date  . Placement of permanent pacemaker    . Coronary artery bypass graft      x3 using a left internal mammary artery  . Cardiac catheterization      Dr. Tami Ribas which showed a 95% ostial LAD stenosis  . US echocardiography      lft ventricular ejection fraction was about 60%  . Bilateral carotid duplex ultrasound showed no ica      stenosis  . Pacemaker generator change N/A 08/09/2014    Procedure: PACEMAKER GENERATOR CHANGE Sheran Spine;  Surgeon: Sanda Klein, MD;  Location: [facility];  Service: Cardiovascular;  Laterality: N/A;     Current Outpatient  Prescriptions  Medication Sig Dispense Refill  . CRESTOR 10 MG tablet TAKE ONE TABLET BY MOUTH EVERY OTHER DAY 15 tablet 1  . ELIQUIS 5 MG TABS tablet TAKE ONE TABLET BY MOUTH TWICE DAILY 60 tablet 1  . Ferrous Sulfate Dried (FERROUS SULFATE CR PO) Take by mouth daily.    Marland Kitchen levothyroxine (SYNTHROID, LEVOTHROID) 137 MCG tablet Take 137 mcg by mouth daily before breakfast.    . metoprolol succinate (TOPROL XL) 25 MG 24 hr tablet Take 1 tablet (25 mg total) by mouth daily. 30 tablet 10  .  Vitamins-Lipotropics (LIPO-FLAVONOID PLUS PO) Take 1 tablet by mouth daily.     No current facility-administered medications for this visit.    Allergies:   Crestor; Latex; and Sulfa antibiotics    Social History:  The patient  reports that he has never smoked. He does not have any smokeless tobacco history on file. He reports that he drinks alcohol. He reports that he does not use illicit drugs.   Family History:  The patient's family history includes Hypertension in his mother; Stroke in his mother.    ROS:  Please see the history of present illness.    Otherwise, review of systems positive for none.   All other systems are reviewed and negative.    PHYSICAL EXAM: VS:  BP 158/82 mmHg  Pulse 81  Ht 5' 9"  (1.753 m)  Wt 152 lb (68.947 kg)  BMI 22.44 kg/m2 , BMI Body mass index is 22.44 kg/(m^2).  General: Alert, oriented x3, no distress Head: no evidence of trauma, PERRL, EOMI, no exophtalmos or lid lag, no myxedema, no xanthelasma; normal ears, nose and oropharynx Neck: normal jugular venous pulsations and no hepatojugular reflux; brisk carotid pulses without delay and no carotid bruits Chest: clear to auscultation, no signs of consolidation by percussion or palpation, normal fremitus, symmetrical and full respiratory excursions Cardiovascular: normal position and quality of the apical impulse, regular rhythm, normal first and second heart sounds, no  murmurs, rubs or gallops. Sternotomy scar and pacemaker site both appear healthy Abdomen: no tenderness or distention, no masses by palpation, no abnormal pulsatility or arterial bruits, normal bowel sounds, no hepatosplenomegaly Extremities: no clubbing, cyanosis or edema; 2+ radial, ulnar and brachial pulses bilaterally; 2+ right femoral, posterior tibial and dorsalis pedis pulses; 2+ left femoral, posterior tibial and dorsalis pedis pulses; no subclavian or femoral bruits Neurological: grossly nonfocal Psych: euthymic mood, full  affect   EKG:  EKG is ordered today. The ekg ordered today demonstrates atrial paced, ventricular sensed, otherwise normal   Recent Labs: 08/09/2014: Potassium 4.7 05/10/2015: Hemoglobin 15.1; Platelets 123*    Lipid Panel No results found for: CHOL, TRIG, HDL, CHOLHDL, VLDL, LDLCALC, LDLDIRECT    Wt Readings from Last 3 Encounters:  05/23/15 152 lb (68.947 kg)  05/17/15 153 lb 5.3 oz (69.55 kg)  05/10/15 149 lb 4 oz (67.699 kg)      Other studies Reviewed: Additional studies/ records that were reviewed today include: Notes from Dr Ma Hillock.   ASSESSMENT AND PLAN:  Paroxysmal atrial fibrillation Elevated embolic risk due to age, vascular disease and HTN. CHADSVasc 4. Thrombocytopenia is mild and he has not had any bleeding events. Continue anticoagulation. Antiarrhythmics not indicated for infrequent and asymptomatic spells. Reasonably good rate control and last event  CAD s/p CABG 2007 Until recently, asymptomatic despite a very active lifestyle. He now has some vague exertional symptoms. Baseline ECGT ST segments are normal and pacemaker interrogation shows that he can achieve heart  rates in the 120s. We'll schedule for a treadmill stress test.  Hyperlipidemia One Crestor every other day.` The most recent labs available from April did not include a lipid profile. Labs from last December showed total cholesterol 204, triglycerides 55, HDL 61, LDL 132 and hemoglobin A1c of 5.4%. Unsure whether he was taking Crestor at that time. Preferably, target LDL less than 100. We'll revisit that after his stress test.  SSS (sick sinus syndrome) Pacemaker - Medtronic Adapta 2015 Normal device function. He is not pacemaker dependent but requires atrial pacing almost 90% of the time secondary to sinus bradycardia. Continue remote downloads every 3 months.  HTN (hypertension) Elevated blood pressure today, but he is emotional regarding his new hematological problem. About a year ago his  medications were reduced for orthostatic hypotension. No changes today.   Current medicines are reviewed at length with the patient today.  The patient does not have concerns regarding medicines.  The following changes have been made:  no change  Labs/ tests ordered today include:  Orders Placed This Encounter  Procedures  . Exercise Tolerance Test  . Implantable device check  . EKG 12-Lead     Patient Instructions  Medication Instructions:   NONE    Labwork:  NONE  Testing/Procedures:  Your physician has requested that you have an exercise tolerance test. For further information please visit [email or web address]. Please also follow instruction sheet, as given.    Follow-Up:  Remote monitoring is used to monitor your Pacemaker or ICD from home. This monitoring reduces the number of office visits required to check your device to one time per year. It allows [place] to monitor the functioning of your device to ensure it is working properly. You are scheduled for a device check from home on August 24, 2015. You may send your transmission at any time that day. If you have a wireless device, the transmission will be sent automatically. After your physician reviews your transmission, you will receive a postcard with your next transmission date.  Dr. Sallyanne Kuster recommends that you schedule a follow-up appointment in: ONE YEAR           SignedSanda Klein, MD  05/23/2015 2:40 PM    Sanda Klein, MD, [facility] [phone] office [phone] pager

## 2015-05-24 ENCOUNTER — Inpatient Hospital Stay: Payer: Medicare Other | Admitting: Internal Medicine

## 2015-05-24 ENCOUNTER — Other Ambulatory Visit: Payer: Medicare Other

## 2015-05-24 ENCOUNTER — Inpatient Hospital Stay: Payer: Medicare Other

## 2015-05-24 VITALS — BP 142/78 | HR 60 | Temp 97.1°F | Resp 18

## 2015-05-24 DIAGNOSIS — D696 Thrombocytopenia, unspecified: Secondary | ICD-10-CM | POA: Diagnosis not present

## 2015-05-24 LAB — CBC WITH DIFFERENTIAL/PLATELET
Basophils Absolute: 0 10*3/uL (ref 0–0.1)
Basophils Relative: 1 %
EOS ABS: 0.1 10*3/uL (ref 0–0.7)
Eosinophils Relative: 1 %
HEMATOCRIT: 45.8 % (ref 40.0–52.0)
Hemoglobin: 15.1 g/dL (ref 13.0–18.0)
LYMPHS PCT: 22 %
Lymphs Abs: 1.3 10*3/uL (ref 1.0–3.6)
MCH: 29.9 pg (ref 26.0–34.0)
MCHC: 33.1 g/dL (ref 32.0–36.0)
MCV: 90.4 fL (ref 80.0–100.0)
MONO ABS: 0.4 10*3/uL (ref 0.2–1.0)
Monocytes Relative: 7 %
NEUTROS ABS: 4 10*3/uL (ref 1.4–6.5)
NEUTROS PCT: 69 %
Platelets: 132 10*3/uL — ABNORMAL LOW (ref 150–440)
RBC: 5.07 MIL/uL (ref 4.40–5.90)
RDW: 13.3 % (ref 11.5–14.5)
WBC: 5.8 10*3/uL (ref 3.8–10.6)

## 2015-05-24 LAB — RETICULOCYTES
RBC.: 5.07 MIL/uL (ref 4.40–5.90)
RETIC COUNT ABSOLUTE: 35.5 10*3/uL (ref 19.0–183.0)
Retic Ct Pct: 0.7 % (ref 0.4–3.1)

## 2015-05-24 MED ORDER — HEPARIN SOD (PORK) LOCK FLUSH 100 UNIT/ML IV SOLN
INTRAVENOUS | Status: AC
  Filled 2015-05-24: qty 5

## 2015-05-31 ENCOUNTER — Telehealth: Payer: Self-pay | Admitting: *Deleted

## 2015-05-31 NOTE — Telephone Encounter (Signed)
Called pt and we only have 1 of the 4 tests back yet.  At this point there is not neoplasm identified but it is favoring a secondary or reactive etiology.  In Dr. 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  Pt will be notified of the results if there is something that needs to be started based on results and if not then pt will see us on the appt date. Pt agreeable to the plan

## 2015-06-08 ENCOUNTER — Telehealth (HOSPITAL_COMMUNITY): Payer: Self-pay

## 2015-06-08 NOTE — Telephone Encounter (Signed)
Encounter complete. 

## 2015-06-13 ENCOUNTER — Encounter (HOSPITAL_COMMUNITY): Payer: Medicare Other

## 2015-06-16 ENCOUNTER — Encounter: Payer: Self-pay | Admitting: Cardiovascular Disease

## 2015-06-16 ENCOUNTER — Inpatient Hospital Stay: Payer: Medicare Other | Attending: Internal Medicine | Admitting: Internal Medicine

## 2015-06-16 VITALS — BP 149/85 | HR 80 | Temp 96.0°F | Resp 18 | Ht 69.0 in | Wt 155.2 lb

## 2015-06-16 DIAGNOSIS — E039 Hypothyroidism, unspecified: Secondary | ICD-10-CM

## 2015-06-16 DIAGNOSIS — M1990 Unspecified osteoarthritis, unspecified site: Secondary | ICD-10-CM

## 2015-06-16 DIAGNOSIS — I10 Essential (primary) hypertension: Secondary | ICD-10-CM

## 2015-06-16 DIAGNOSIS — Z79899 Other long term (current) drug therapy: Secondary | ICD-10-CM | POA: Insufficient documentation

## 2015-06-16 DIAGNOSIS — D696 Thrombocytopenia, unspecified: Secondary | ICD-10-CM | POA: Diagnosis not present

## 2015-06-16 DIAGNOSIS — Z951 Presence of aortocoronary bypass graft: Secondary | ICD-10-CM | POA: Diagnosis not present

## 2015-06-16 DIAGNOSIS — E785 Hyperlipidemia, unspecified: Secondary | ICD-10-CM

## 2015-06-16 DIAGNOSIS — Z950 Presence of cardiac pacemaker: Secondary | ICD-10-CM

## 2015-06-16 DIAGNOSIS — I4891 Unspecified atrial fibrillation: Secondary | ICD-10-CM | POA: Diagnosis not present

## 2015-06-25 NOTE — Progress Notes (Signed)
[facility]  [phone] [phone]     ID: [name] OB: 01/04/1933  MR#: [id number]  QXI#:[id number]  Patient Care Team: Lavera Guise, MD as PCP - General (Internal Medicine)  CHIEF COMPLAINT/DIAGNOSIS:  Thrombocytopenia of unclear etiology  -  ? Mild ITP vs other etiology. (Labs on 04/18/15 showed platelet count below normal range at 144 with creatinine of 1.15, calcium of 9.5. Platelet count in December 2015 was 144, hemoglobin 14.6, WBC 4700 with unremarkable differential, creatinine 1.20. In April 2016, hemoglobin 15.7, WBC 5300 with unremarkable differential, platelets low at 131, creatinine 1.16).   Workup done on 05/10/15 - Hb 15.1, platelets 123, WBC 6400, ferritin 120, serum iron 62, iron saturation slightly low at 17%. Otherwise serum B-12, folate, SPEP, kappa/lambda ratio (1.4), PT, PTT, HIV antibody, HBsAg, platelet antibody test all unremarkable      HISTORY OF PRESENT ILLNESS:  Patient returns for continued oncology follow-up. States that he is doing well. Denies bleeding symptoms. Denies any recurrent skin rashes or bruising. Denies fevers or night sweats. Appetite is good.  REVIEW OF SYSTEMS:   Review of Systems  Constitutional: Negative.   HENT: Negative.   Gastrointestinal: Negative.   Genitourinary: Negative.   Musculoskeletal: Positive for joint pain.  Skin: Negative.   Neurological: Negative.     PAST MEDICAL HISTORY: Reviewed. Past Medical History  Diagnosis Date  . Hyperlipidemia   . Hypothyroidism   . Paroxysmal atrial fibrillation 11/22/2014  . Hypertension   . Arthritis   . Thrombocytopenia 05/23/2015    PAST SURGICAL HISTORY: Reviewed. Past Surgical History  Procedure Laterality Date  . Placement of permanent pacemaker    . Coronary artery bypass graft      x3 using a left internal mammary artery  . Cardiac catheterization      Dr. Tami Ribas which showed a 95% ostial LAD stenosis  . US echocardiography     lft ventricular ejection fraction was about 60%  . Bilateral carotid duplex ultrasound showed no ica      stenosis  . Pacemaker generator change N/A 08/09/2014    Procedure: PACEMAKER GENERATOR CHANGE Sheran Spine;  Surgeon: Sanda Klein, MD;  Location: [facility] CATH [facility];  Service: Cardiovascular;  Laterality: N/A;    FAMILY HISTORY: Reviewed. Family History  Problem Relation Age of Onset  . Hypertension Mother   . Stroke Mother     SOCIAL HISTORY: Reviewed. Social History  Substance Use Topics  . Smoking status: Never Smoker   . Smokeless tobacco: Not on file  . Alcohol Use: Yes     Comment: occas.    Allergies  Allergen Reactions  . Crestor [Rosuvastatin]     myalgias  . Latex Itching    PT STATES THAT AFTER WEARING LATEX GLOVES TO WORK IN THAT HIS HANDS WERE VERY ITCHY.  . Sulfa Antibiotics Rash    Current Outpatient Prescriptions  Medication Sig Dispense Refill  . CRESTOR 10 MG tablet TAKE ONE TABLET BY MOUTH EVERY OTHER DAY 15 tablet 1  . ELIQUIS 5 MG TABS tablet TAKE ONE TABLET BY MOUTH TWICE DAILY 60 tablet 1  . Ferrous Sulfate Dried (FERROUS SULFATE CR PO) Take by mouth daily.    Marland Kitchen levothyroxine (SYNTHROID, LEVOTHROID) 137 MCG tablet Take 137 mcg by mouth daily before breakfast.    . metoprolol succinate (TOPROL XL) 25 MG 24 hr tablet Take 1 tablet (25 mg total) by mouth daily. 30 tablet 10  . sodium polystyrene (KAYEXALATE) 15 GM/60ML suspension Take 15  g by mouth once a week.    . Vitamins-Lipotropics (LIPO-FLAVONOID PLUS PO) Take 1 tablet by mouth daily.     No current facility-administered medications for this visit.    PHYSICAL EXAM: Filed Vitals:   06/16/15 1118  BP: 149/85  Pulse: 80  Temp: 96 F (35.6 C)  Resp: 18     Body mass index is 22.91 kg/(m^2).     GENERAL: Patient is alert and oriented and in no acute distress. There is no icterus. HEENT: EOMs intact. Oral exam negative for thrush or petechiae.  LUNGS: Bilaterally clear to auscultation, no  rhonchi. ABDOMEN: Soft, nontender.  SKIN: No rashes, petechiae or major bruising   LAB RESULTS: 04/18/15 - platelet count below normal range at 144 with creatinine of 1.15, calcium of 9.5.  April 2016 - hemoglobin 15.7, WBC 5300 with unremarkable differential, platelets low at 131, creatinine 1.16. Dec 2015 - platelet count 144, hemoglobin 14.6, WBC 4700 with unremarkable differential, creatinine 1.20.  STUDIES:   ASSESSMENT / PLAN:   Thrombocytopenia of unclear etiology  (Labs on 04/18/15 showed platelet count below normal range at 144 with creatinine of 1.15, calcium of 9.5. Platelet count in December 2015 was 144, hemoglobin 14.6, WBC 4700 with unremarkable differential, creatinine 1.20. In April 2016, hemoglobin 15.7, WBC 5300 with unremarkable differential, platelets low at 131, creatinine 1.16). Workup done on 05/10/2015-hemoglobin 15.1, platelets 123, WBC 6400, ferritin 120, serum iron 62, iron saturation slightly low at 17%. Otherwise serum B-12, folate, SPEP, kappa/lambda ratio (1.4), PT, PTT, HIV antibody, HBsAg, platelet antibody test all unremarkable   -   have reviewed bone marrow biopsy report which shows mild dyserythropoiesis and mildly hypocellular bone marrow but otherwise is unremarkable including cytogenetics is unremarkable. Given this, have explained to patient that it is not clear if he does have early MDS are not, and plans continued monitoring. Other possible explanation for thrombocytopenia is ITP (immune thrombocytopenic purpura) versus other etiology. Ultrasound abdomen on 716 was negative for hepatosplenomegaly. Plan is continued monitoring, get CBC/differential at q16 weeks, next MD follow-up at 48 weeks with repeat labs. For slightly low iron saturation, have advised him to take ferrous sulfate 325 mg tablet once daily.  In between visits, the patient has been advised to call or come to ER in case of fevers, bleeding, acute sickness or new symptoms. He is agreeable to  this plan.    Leia Alf, MD   06/25/2015 9:50 PM

## 2015-07-03 NOTE — Progress Notes (Signed)
This encounter was created in error - please disregard.

## 2015-08-07 ENCOUNTER — Other Ambulatory Visit: Payer: Self-pay | Admitting: Cardiology

## 2015-08-07 ENCOUNTER — Other Ambulatory Visit: Payer: Self-pay | Admitting: Cardiovascular Disease

## 2015-08-22 ENCOUNTER — Encounter: Payer: Self-pay | Admitting: Cardiovascular Disease

## 2015-08-22 ENCOUNTER — Ambulatory Visit (INDEPENDENT_AMBULATORY_CARE_PROVIDER_SITE_OTHER): Payer: Medicare Other | Admitting: *Deleted

## 2015-08-22 DIAGNOSIS — I495 Sick sinus syndrome: Secondary | ICD-10-CM

## 2015-08-22 NOTE — Progress Notes (Signed)
Remote pacemaker transmission.   

## 2015-08-25 LAB — CUP PACEART REMOTE DEVICE CHECK
Battery Voltage: 2.79 V
Brady Statistic AP VP Percent: 0 %
Brady Statistic AP VS Percent: 87 %
Brady Statistic AS VP Percent: 0 %
Date Time Interrogation Session: 20161011114643
Implantable Lead Implant Date: 20071115
Implantable Lead Location: 753859
Implantable Lead Location: 753860
Implantable Lead Model: 4092
Implantable Lead Model: 5076
Lead Channel Impedance Value: 627 Ohm
Lead Channel Pacing Threshold Amplitude: 0.625 V
Lead Channel Pacing Threshold Amplitude: 0.625 V
Lead Channel Pacing Threshold Pulse Width: 0.4 ms
Lead Channel Pacing Threshold Pulse Width: 0.4 ms
Lead Channel Sensing Intrinsic Amplitude: 11.2 mV
Lead Channel Setting Pacing Amplitude: 2 V
Lead Channel Setting Pacing Amplitude: 2.5 V
Lead Channel Setting Pacing Pulse Width: 0.4 ms
Lead Channel Setting Sensing Sensitivity: 5.6 mV
MDC IDC LEAD IMPLANT DT: 20071115
MDC IDC MSMT BATTERY IMPEDANCE: 112 Ohm
MDC IDC MSMT BATTERY REMAINING LONGEVITY: 142 mo
MDC IDC MSMT LEADCHNL RA IMPEDANCE VALUE: 523 Ohm
MDC IDC STAT BRADY AS VS PERCENT: 13 %

## 2015-08-31 ENCOUNTER — Encounter: Payer: Self-pay | Admitting: Cardiology

## 2015-09-15 ENCOUNTER — Encounter: Payer: Self-pay | Admitting: Cardiovascular Disease

## 2015-09-18 ENCOUNTER — Encounter: Payer: Self-pay | Admitting: Cardiovascular Disease

## 2015-09-20 ENCOUNTER — Encounter: Payer: Medicare Other | Admitting: Cardiovascular Disease

## 2015-09-25 NOTE — Progress Notes (Signed)
Patient ID: [name], male   DOB: 06-Dec-1932, 81 y.o.   MRN: [id number]      Cardiology Office Note   Date:  09/26/2015   ID:  [name], DOB [birth year], MRN [id number]  PCP:  Lyndon Code, MD  Cardiologist:   Thurmon Fair, MD   Chief Complaint  Patient presents with  . Follow-up    4 MONTH:  End of October had an episode of rapid heart rate lasting 1+ hours.  No chest pain, SOB, edema. Occas. lightheadedness positional      History of Present Illness: [name] is a 81 y.o. male who presents for atrial fibrillation and pacemaker check.  While playing tennis recently, he experienced abrupt onset of rapid palpitations and mild dizziness. He asked one of his friends to drive him home. The symptoms resolved after approximately an hour. His pacemaker recorded an episode of atrial fibrillation with rapid ventricular response (ventricular rate average 125) corresponding to his symptoms. About 2 weeks before this event he was started on Cytomel 5 g daily. Shortly after this happened his levothyroxine dose was adjusted down. He has not had palpitations since in the device has not recorded any new episodes of atrial fibrillation  We recommended a stress test last July, but this was not performed. His problems with mild reduction in exercise tolerance have resolved.other than the recent arrhythmic event, he feels great  Pacemaker interrogation shows normal device function with estimated generator longevity of 10 years. Presenting rhythm is atrial paced ventricular sensed (counters showed 87% atrial pacing and 0.1% ventricular pacing). The overall burden of atrial fibrillation has been 0.1% and all events other than the most recent one showed excellent ventricular rate control. Until the recent episode of atrial fibrillation, he was never really aware of the arrhythmia. He has not had stroke or TIA. His blood pressure is slightly elevated today but this is quite atypical for him. Usually his  systolic blood pressures in the 130s  He had bypass surgery in 2007 (Dr. Laneta Simmers 2007, LIMA to LAD, SVG to OM, SVG to RCA) and has been asymptomatic since. He had a dual chamber permanent pacemaker implanted in 2007 for symptomatic bradycardia secondary to sinus node dysfunction and had a generator change in 2015 (Medtronic Adapta). He has an underlying sinus rate in the 40s.   Past Medical History  Diagnosis Date  . Hyperlipidemia   . Hypothyroidism   . Paroxysmal atrial fibrillation (HCC) 11/22/2014  . Hypertension   . Arthritis   . Thrombocytopenia (HCC) 05/23/2015    Past Surgical History  Procedure Laterality Date  . Placement of permanent pacemaker    . Coronary artery bypass graft      x3 using a left internal mammary artery  . Cardiac catheterization      Dr. Jenne Campus which showed a 95% ostial LAD stenosis  . US echocardiography      lft ventricular ejection fraction was about 60%  . Bilateral carotid duplex ultrasound showed no ica      stenosis  . Pacemaker generator change N/A 08/09/2014    Procedure: PACEMAKER GENERATOR CHANGE [name];  Surgeon: Thurmon Fair, MD;  Location: [facility];  Service: Cardiovascular;  Laterality: N/A;     Current Outpatient Prescriptions  Medication Sig Dispense Refill  . CRESTOR 10 MG tablet TAKE ONE TABLET BY MOUTH EVERY OTHER DAY 15 tablet 10  . ELIQUIS 5 MG TABS tablet TAKE ONE TABLET BY MOUTH TWICE DAILY 60 tablet 5  .  Ferrous Sulfate Dried (FERROUS SULFATE CR PO) Take by mouth daily.    Marland Kitchen. liothyronine (CYTOMEL) 5 MCG tablet Take 5 mcg by mouth daily.    . metoprolol succinate (TOPROL-XL) 25 MG 24 hr tablet TAKE ONE TABLET BY MOUTH EVERY DAY 30 tablet 10  . SYNTHROID 100 MCG tablet Take 100 mg by mouth daily.     No current facility-administered medications for this visit.    Allergies:   Crestor; Latex; and Sulfa antibiotics    Social History:  The patient  reports that he has never smoked. He does not have any smokeless  tobacco history on file. He reports that he drinks alcohol. He reports that he does not use illicit drugs.   Family History:  The patient's family history includes Hypertension in his mother; Stroke in his mother.    ROS:  Please see the history of present illness.    Otherwise, review of systems positive for none.   All other systems are reviewed and negative.    PHYSICAL EXAM: VS:  BP 145/84 mmHg  Pulse 66  Ht 5\' 9"  (1.753 m)  Wt 164 lb 1.6 oz (74.435 kg)  BMI 24.22 kg/m2 , BMI Body mass index is 24.22 kg/(m^2).  General: Alert, oriented x3, no distress Head: no evidence of trauma, PERRL, EOMI, no exophtalmos or lid lag, no myxedema, no xanthelasma; normal ears, nose and oropharynx Neck: normal jugular venous pulsations and no hepatojugular reflux; brisk carotid pulses without delay and no carotid bruits Chest: clear to auscultation, no signs of consolidation by percussion or palpation, normal fremitus, symmetrical and full respiratory excursions Cardiovascular: normal position and quality of the apical impulse, regular rhythm, normal first and second heart sounds, no murmurs, rubs or gallops Abdomen: no tenderness or distention, no masses by palpation, no abnormal pulsatility or arterial bruits, normal bowel sounds, no hepatosplenomegaly Extremities: no clubbing, cyanosis or edema; 2+ radial, ulnar and brachial pulses bilaterally; 2+ right femoral, posterior tibial and dorsalis pedis pulses; 2+ left femoral, posterior tibial and dorsalis pedis pulses; no subclavian or femoral bruits Neurological: grossly nonfocal Psych: euthymic mood, full affect   EKG:  EKG is not ordered today.   Recent Labs: 05/24/2015: Hemoglobin 15.1; Platelets 132*    Lipid Panel No results found for: CHOL, TRIG, HDL, CHOLHDL, VLDL, LDLCALC, LDLDIRECT    Wt Readings from Last 3 Encounters:  09/26/15 164 lb 1.6 oz (74.435 kg)  06/16/15 155 lb 3.3 oz (70.401 kg)  05/23/15 152 lb (68.947 kg)    .   ASSESSMENT AND PLAN:  Paroxysmal atrial fibrillation Elevated embolic risk due to age, vascular disease and HTN. CHADSVasc 4. Thrombocytopenia is mild and he has not had any bleeding events. Continue anticoagulation. His recent episode of symptomatic atrial fibrillation with rapid ventricular response was likely related to iatrogenic hyperthyroidism, improved following his most recent levothyroxine dose adjustment. I don't think we need to change our current rate control strategy. But there is plenty of room to increase the metoprolol if atrial fibrillation becomes more prevalent and more symptomatic  CAD s/p CABG 2007 Asymptomatic.  Hyperlipidemia One Crestor every other day.` The most recent labs available from April did not include a lipid profile. Labs from last December showed total cholesterol 204, triglycerides 55, HDL 61, LDL 132 and hemoglobin A1c of 5.4%. Unsure whether he was taking Crestor at that time. Preferably, target LDL less than 100.   SSS (sick sinus syndrome) Pacemaker - Medtronic Adapta 2015 Normal device function. He is not pacemaker dependent  but requires atrial pacing almost 90% of the time secondary to sinus bradycardia. Continue remote downloads every 3 months.  HTN (hypertension) Last year ago his medications were reduced for orthostatic hypotension.    Current medicines are reviewed at length with the patient today.  The patient does not have concerns regarding medicines.  The following changes have been made:  no change  Labs/ tests ordered today include:  No orders of the defined types were placed in this encounter.     Patient Instructions  Remote monitoring is used to monitor your Pacemaker from home. This monitoring reduces the number of office visits required to check your device to one time per year. It allows [place] to monitor the functioning of your device to ensure it is working properly. You are scheduled for a device check from home on  December 28, 2015. You may send your transmission at any time that day. If you have a wireless device, the transmission will be sent automatically. After your physician reviews your transmission, you will receive a postcard with your next transmission date.  Dr. Royann Shivers recommends that you schedule a follow-up appointment in: ONE YEAR WITH MEDTR[employer]IC PACEMAKER CHECK (GRAY).         Joie Bimler, MD  09/26/2015 8:56 AM    Thurmon Fair, MD, [facility] [phone] office [phone] pager

## 2015-09-26 ENCOUNTER — Ambulatory Visit (INDEPENDENT_AMBULATORY_CARE_PROVIDER_SITE_OTHER): Payer: Medicare Other | Admitting: Cardiovascular Disease

## 2015-09-26 ENCOUNTER — Encounter: Payer: Self-pay | Admitting: Cardiovascular Disease

## 2015-09-26 VITALS — BP 145/84 | HR 66 | Ht 69.0 in | Wt 164.1 lb

## 2015-09-26 DIAGNOSIS — I2510 Atherosclerotic heart disease of native coronary artery without angina pectoris: Secondary | ICD-10-CM

## 2015-09-26 DIAGNOSIS — Z950 Presence of cardiac pacemaker: Secondary | ICD-10-CM

## 2015-09-26 DIAGNOSIS — I495 Sick sinus syndrome: Secondary | ICD-10-CM

## 2015-09-26 DIAGNOSIS — I10 Essential (primary) hypertension: Secondary | ICD-10-CM

## 2015-09-26 DIAGNOSIS — I480 Paroxysmal atrial fibrillation: Secondary | ICD-10-CM | POA: Diagnosis not present

## 2015-09-26 DIAGNOSIS — E785 Hyperlipidemia, unspecified: Secondary | ICD-10-CM

## 2015-09-26 NOTE — Patient Instructions (Signed)
Remote monitoring is used to monitor your Pacemaker from home. This monitoring reduces the number of office visits required to check your device to one time per year. It allows us to monitor the functioning of your device to ensure it is working properly. You are scheduled for a device check from home on December 28, 2015. You may send your transmission at any time that day. If you have a wireless device, the transmission will be sent automatically. After your physician reviews your transmission, you will receive a postcard with your next transmission date.  Dr. Royann Shiversroitoru recommends that you schedule a follow-up appointment in: ONE YEAR WITH [employer] PACEMAKER CHECK (GRAY).

## 2015-10-04 ENCOUNTER — Inpatient Hospital Stay: Payer: Medicare Other | Attending: Internal Medicine

## 2015-10-04 DIAGNOSIS — D696 Thrombocytopenia, unspecified: Secondary | ICD-10-CM | POA: Diagnosis not present

## 2015-10-04 LAB — CBC WITH DIFFERENTIAL/PLATELET
Basophils Absolute: 0 10*3/uL (ref 0–0.1)
Basophils Relative: 1 %
EOS ABS: 0.1 10*3/uL (ref 0–0.7)
EOS PCT: 2 %
HCT: 45.6 % (ref 40.0–52.0)
Hemoglobin: 15.3 g/dL (ref 13.0–18.0)
Lymphocytes Relative: 30 %
Lymphs Abs: 1.5 10*3/uL (ref 1.0–3.6)
MCH: 30.1 pg (ref 26.0–34.0)
MCHC: 33.4 g/dL (ref 32.0–36.0)
MCV: 90 fL (ref 80.0–100.0)
MONO ABS: 0.4 10*3/uL (ref 0.2–1.0)
MONOS PCT: 8 %
NEUTROS PCT: 59 %
Neutro Abs: 3 10*3/uL (ref 1.4–6.5)
Platelets: 130 10*3/uL — ABNORMAL LOW (ref 150–440)
RBC: 5.07 MIL/uL (ref 4.40–5.90)
RDW: 13.6 % (ref 11.5–14.5)
WBC: 5.1 10*3/uL (ref 3.8–10.6)

## 2015-10-06 ENCOUNTER — Inpatient Hospital Stay: Payer: Medicare Other

## 2015-10-17 LAB — CUP PACEART INCLINIC DEVICE CHECK
Battery Remaining Longevity: 145 mo
Battery Voltage: 2.79 V
Brady Statistic AP VS Percent: 87 %
Brady Statistic AS VP Percent: 0 %
Brady Statistic AS VS Percent: 13 %
Implantable Lead Implant Date: 20071115
Implantable Lead Implant Date: 20071115
Implantable Lead Location: 753859
Implantable Lead Model: 4092
Lead Channel Impedance Value: 501 Ohm
Lead Channel Impedance Value: 615 Ohm
Lead Channel Setting Pacing Amplitude: 2 V
Lead Channel Setting Pacing Amplitude: 2.5 V
Lead Channel Setting Pacing Pulse Width: 0.4 ms
MDC IDC LEAD LOCATION: 753860
MDC IDC MSMT BATTERY IMPEDANCE: 100 Ohm
MDC IDC SESS DTM: 20161115122327
MDC IDC SET LEADCHNL RV SENSING SENSITIVITY: 5.6 mV
MDC IDC STAT BRADY AP VP PERCENT: 0 %

## 2015-11-20 ENCOUNTER — Ambulatory Visit: Payer: Medicare Other

## 2015-12-28 ENCOUNTER — Telehealth: Payer: Self-pay | Admitting: Cardiology

## 2015-12-28 ENCOUNTER — Ambulatory Visit (INDEPENDENT_AMBULATORY_CARE_PROVIDER_SITE_OTHER): Payer: Medicare Other | Admitting: *Deleted

## 2015-12-28 DIAGNOSIS — I495 Sick sinus syndrome: Secondary | ICD-10-CM | POA: Diagnosis not present

## 2015-12-28 LAB — CUP PACEART REMOTE DEVICE CHECK
Battery Voltage: 2.79 V
Brady Statistic AP VS Percent: 88 %
Brady Statistic AS VP Percent: 0 %
Brady Statistic AS VS Percent: 12 %
Date Time Interrogation Session: 20170216174013
Implantable Lead Implant Date: 20071115
Implantable Lead Location: 753859
Lead Channel Impedance Value: 501 Ohm
Lead Channel Setting Pacing Amplitude: 2 V
Lead Channel Setting Pacing Amplitude: 2.5 V
Lead Channel Setting Pacing Pulse Width: 0.4 ms
MDC IDC LEAD IMPLANT DT: 20071115
MDC IDC LEAD LOCATION: 753860
MDC IDC MSMT BATTERY IMPEDANCE: 112 Ohm
MDC IDC MSMT BATTERY REMAINING LONGEVITY: 141 mo
MDC IDC MSMT LEADCHNL RV IMPEDANCE VALUE: 613 Ohm
MDC IDC SET LEADCHNL RV SENSING SENSITIVITY: 5.6 mV
MDC IDC STAT BRADY AP VP PERCENT: 0 %

## 2015-12-28 NOTE — Telephone Encounter (Signed)
Spoke with pt and reminded pt of remote transmission that is due today. Pt verbalized understanding.   

## 2015-12-29 NOTE — Progress Notes (Signed)
Remote pacemaker transmission.   

## 2016-01-14 NOTE — Progress Notes (Signed)
Normal remote reviewed. 0.3% AF, +Eliquis. VHR episodes are AT with 1:1 conduction.  Next [facility] 03/28/16

## 2016-01-19 ENCOUNTER — Other Ambulatory Visit: Payer: Self-pay | Admitting: Cardiovascular Disease

## 2016-01-24 ENCOUNTER — Encounter: Payer: Self-pay | Admitting: Cardiology

## 2016-01-26 ENCOUNTER — Inpatient Hospital Stay: Payer: Medicare Other | Attending: Internal Medicine

## 2016-01-26 DIAGNOSIS — D696 Thrombocytopenia, unspecified: Secondary | ICD-10-CM | POA: Diagnosis present

## 2016-01-26 LAB — CBC WITH DIFFERENTIAL/PLATELET
Basophils Absolute: 0 10*3/uL (ref 0–0.1)
Basophils Relative: 1 %
EOS PCT: 1 %
Eosinophils Absolute: 0 10*3/uL (ref 0–0.7)
HCT: 41.8 % (ref 40.0–52.0)
HEMOGLOBIN: 14.4 g/dL (ref 13.0–18.0)
LYMPHS ABS: 1.2 10*3/uL (ref 1.0–3.6)
LYMPHS PCT: 22 %
MCH: 30.5 pg (ref 26.0–34.0)
MCHC: 34.3 g/dL (ref 32.0–36.0)
MCV: 89 fL (ref 80.0–100.0)
MONOS PCT: 7 %
Monocytes Absolute: 0.4 10*3/uL (ref 0.2–1.0)
NEUTROS PCT: 69 %
Neutro Abs: 4 10*3/uL (ref 1.4–6.5)
Platelets: 124 10*3/uL — ABNORMAL LOW (ref 150–440)
RBC: 4.7 MIL/uL (ref 4.40–5.90)
RDW: 13.6 % (ref 11.5–14.5)
WBC: 5.7 10*3/uL (ref 3.8–10.6)

## 2016-02-07 ENCOUNTER — Encounter: Payer: Self-pay | Admitting: Cardiology

## 2016-02-24 IMAGING — CR DG HIP (WITH OR WITHOUT PELVIS) 2-3V*L*
1 series · 3 of 3 positions shown · non-contrast
Comparison: Pelvis and left hip dated December 07, 2013

CLINICAL DATA: Left hip pain with movement for several weeks,
thrombocytopenia, history of CABG, no report of trauma.

EXAM:
LEFT HIP (WITH PELVIS) 2-3 VIEWS

[Series 1: t pelvis ap · 0.14mm/px · 3 of 3 slices shown]
[im 1/3]
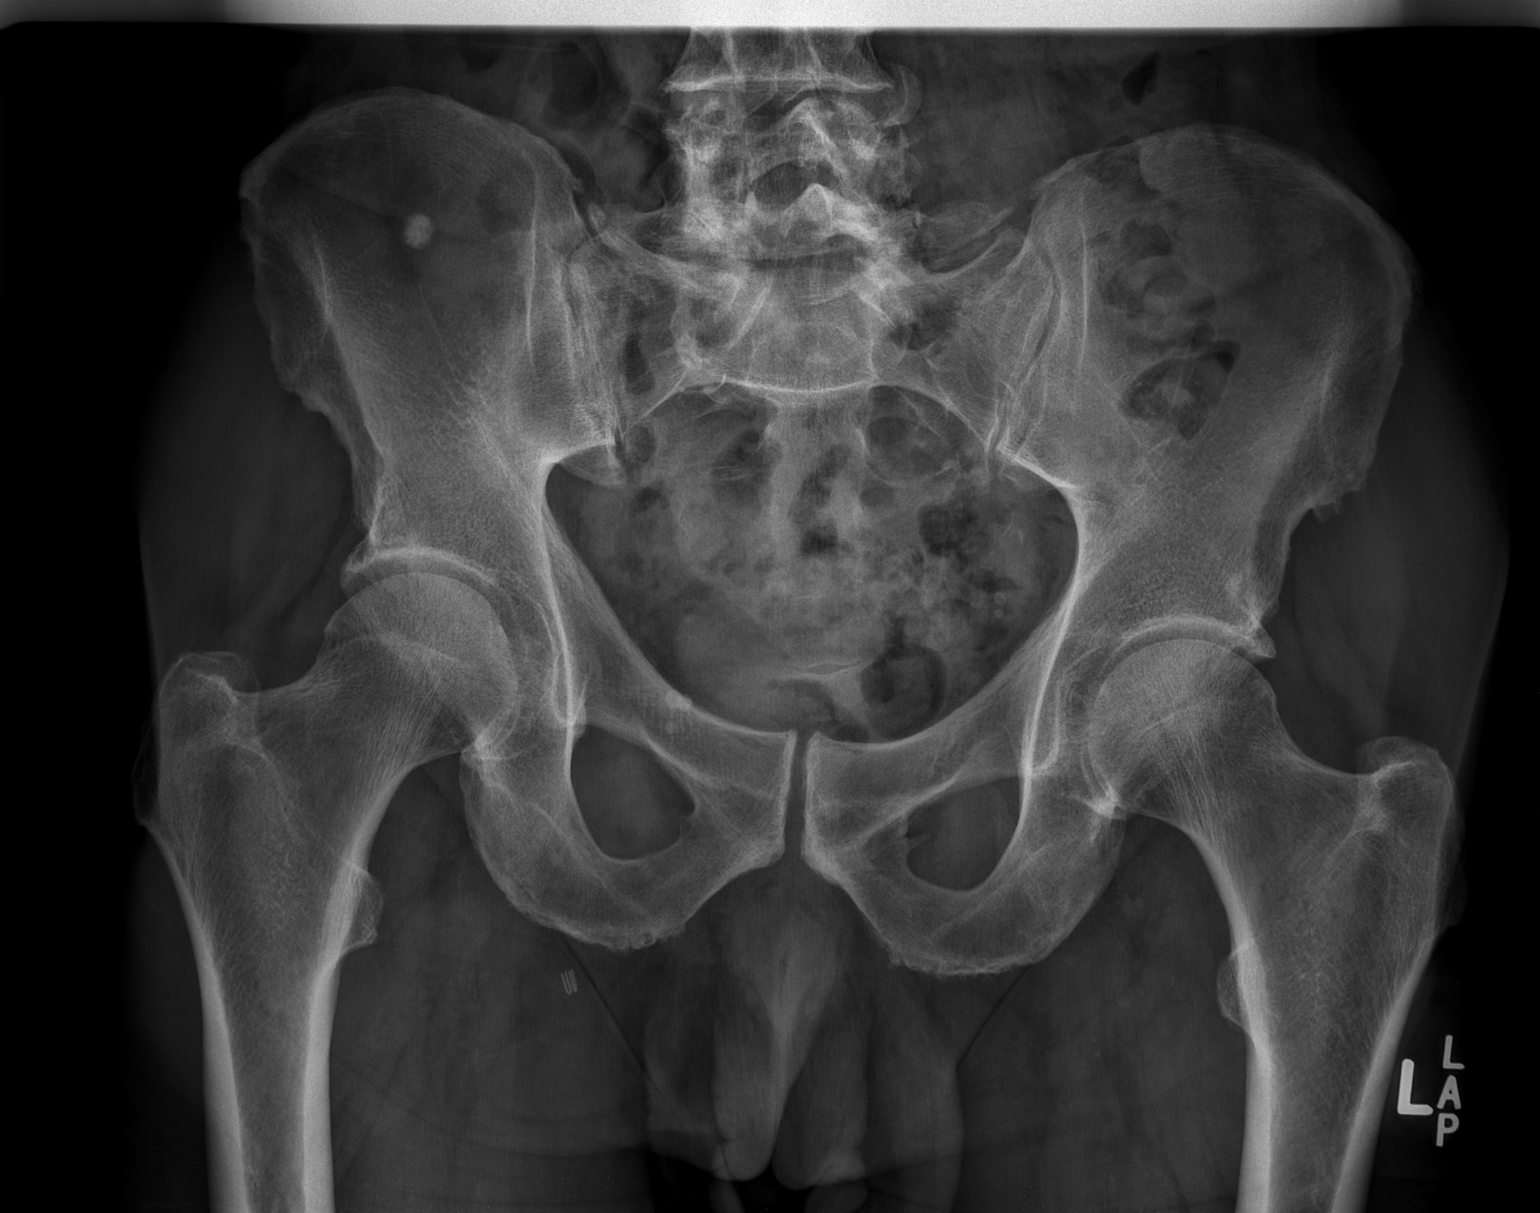
[im 2/3]
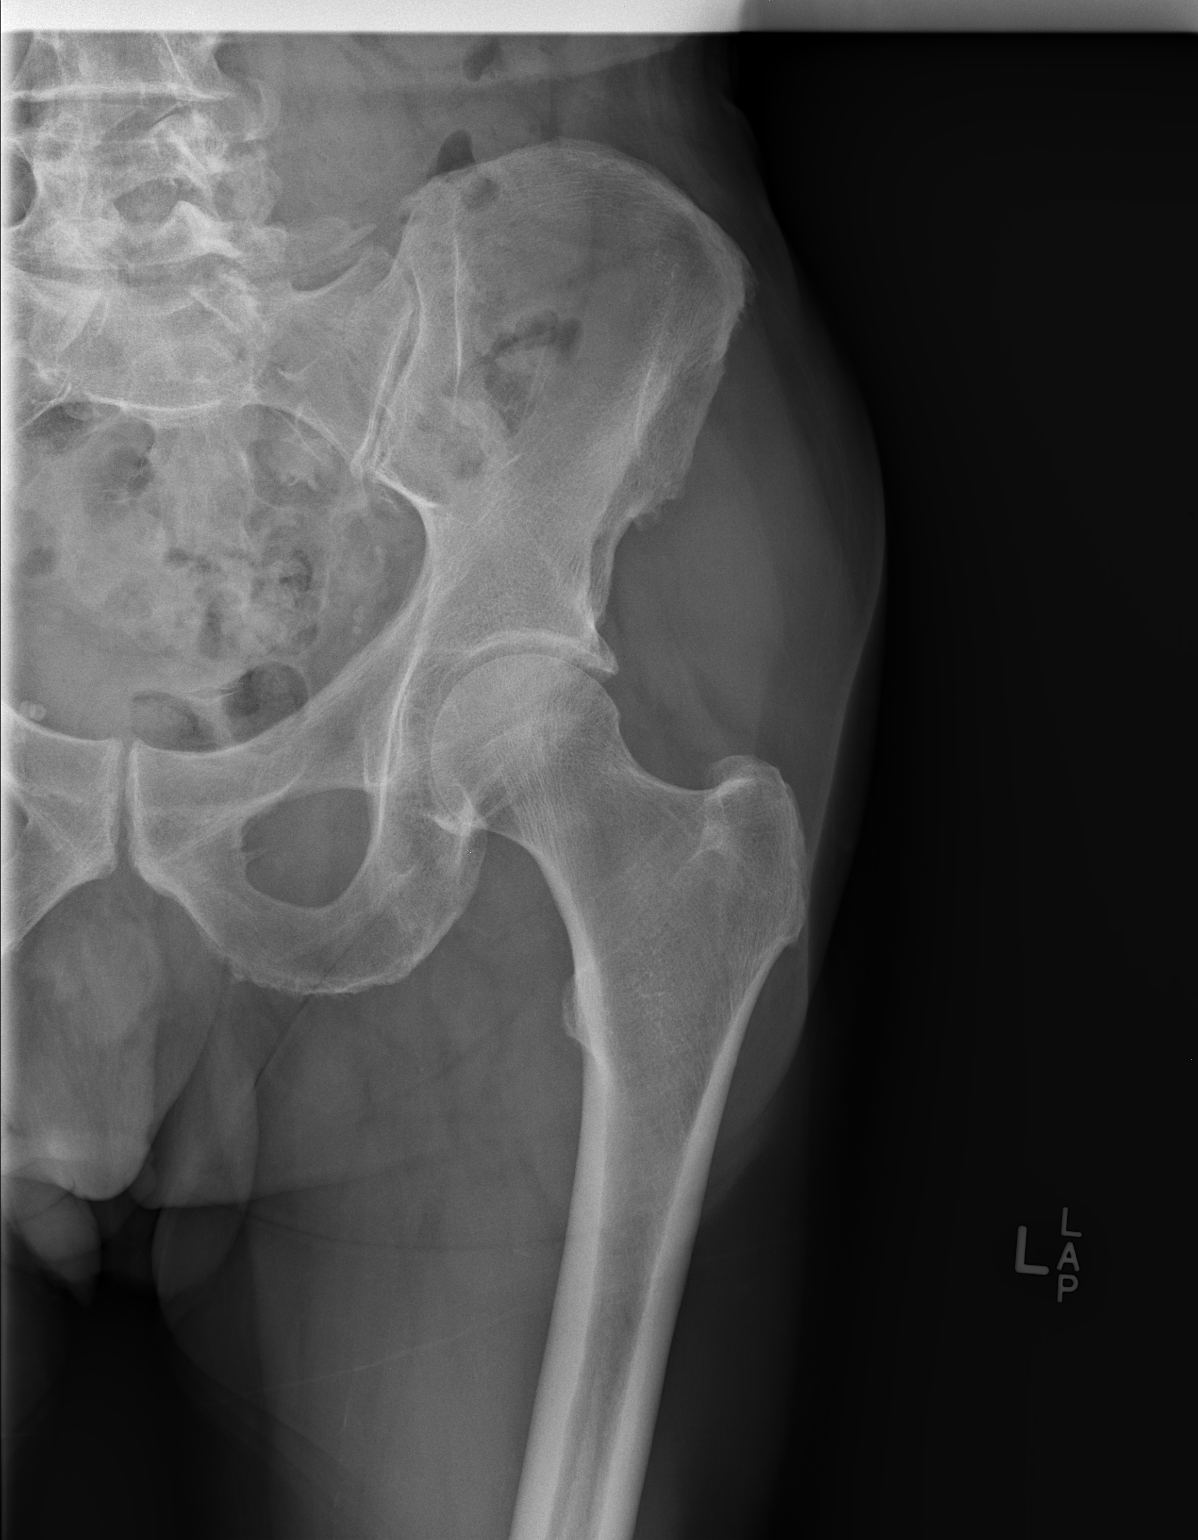
[im 3/3]
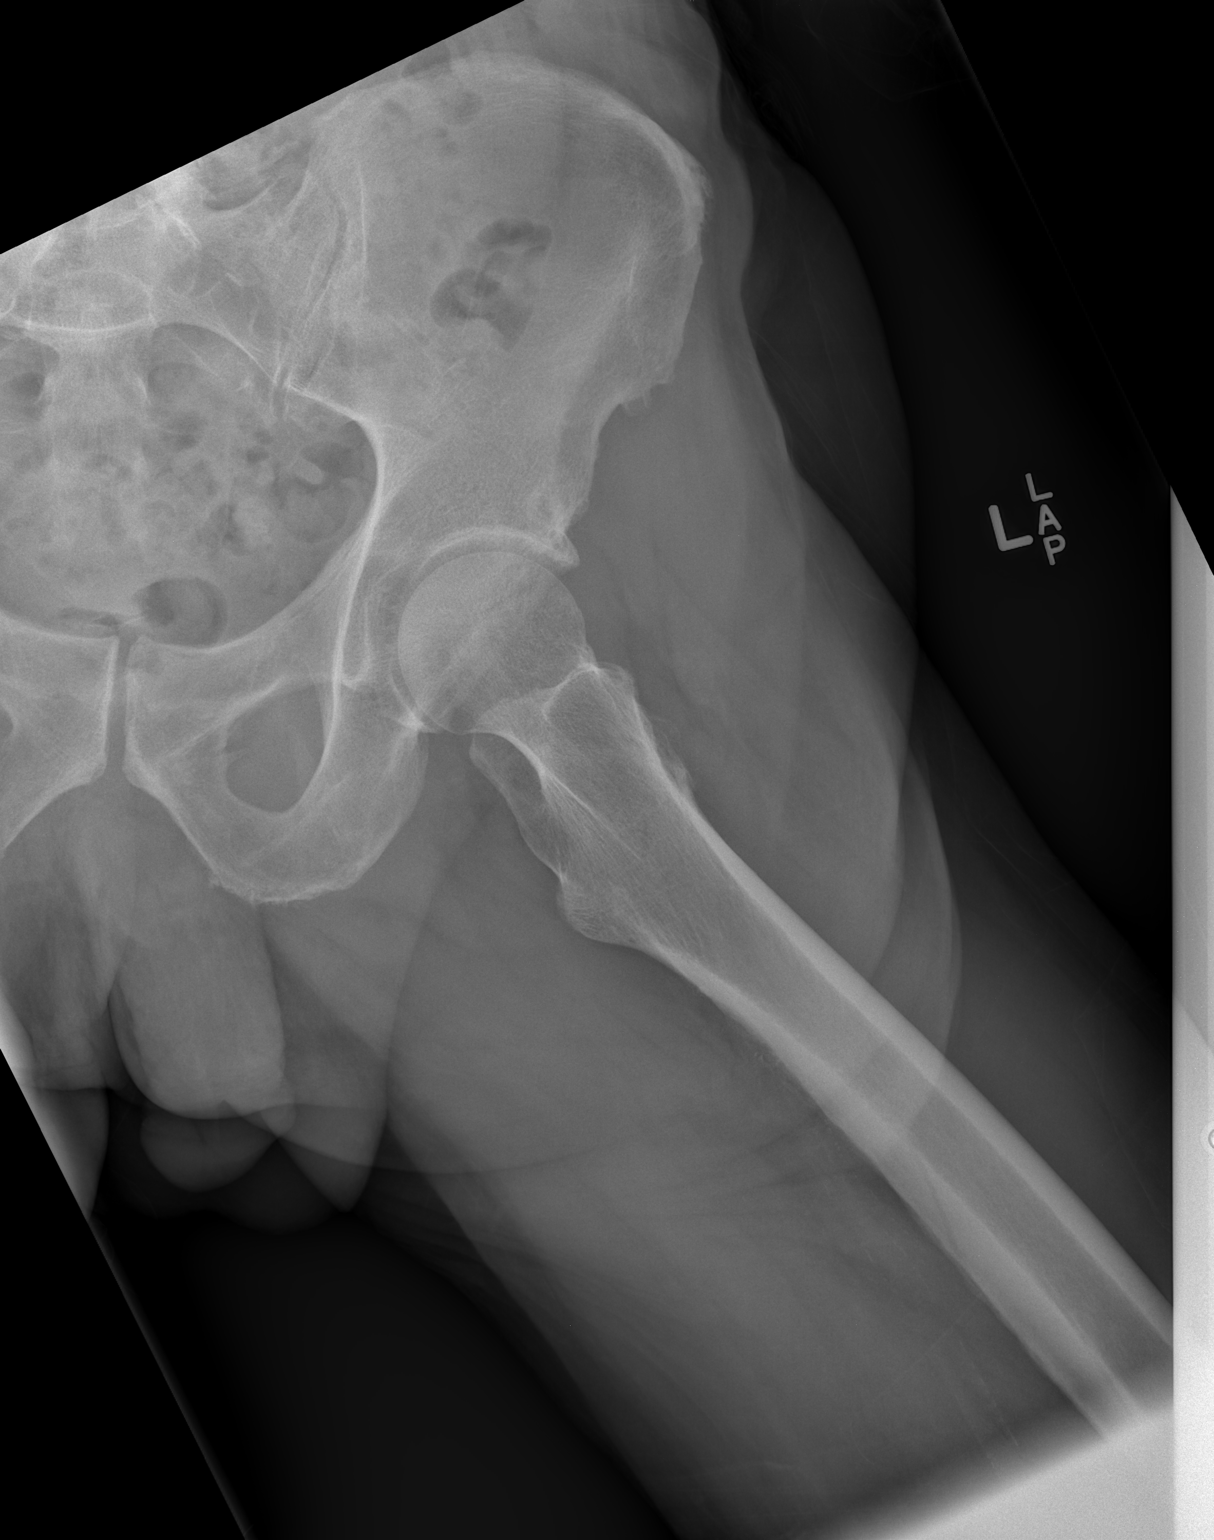

[3 of 3 positions shown; findings below may reference images not displayed]

FINDINGS: The bony pelvis is adequately mineralized. There is no lytic or
blastic lesion or fracture. The sacrum and SI joints are
unremarkable. AP and frog-leg lateral views of the left hip reveal
the articular surfaces to remain smoothly rounded. There is minimal
narrowing of the medial aspect of the joint compartment. The femoral
head and neck and intertrochanteric regions are unremarkable.
IMPRESSION: There are mild osteoarthritic changes of the left hip. There is no
acute fracture nor dislocation.

## 2016-03-28 ENCOUNTER — Ambulatory Visit (INDEPENDENT_AMBULATORY_CARE_PROVIDER_SITE_OTHER): Payer: Medicare Other | Admitting: *Deleted

## 2016-03-28 DIAGNOSIS — I495 Sick sinus syndrome: Secondary | ICD-10-CM | POA: Diagnosis not present

## 2016-03-29 NOTE — Progress Notes (Signed)
Remote pacemaker transmission.   

## 2016-04-17 ENCOUNTER — Encounter: Payer: Self-pay | Admitting: Cardiology

## 2016-04-19 LAB — CUP PACEART REMOTE DEVICE CHECK
Battery Remaining Longevity: 145 mo
Brady Statistic AP VP Percent: 0 %
Brady Statistic AS VS Percent: 11 %
Date Time Interrogation Session: 20170518204416
Implantable Lead Implant Date: 20071115
Implantable Lead Location: 753860
Lead Channel Pacing Threshold Amplitude: 0.5 V
Lead Channel Pacing Threshold Amplitude: 0.625 V
Lead Channel Pacing Threshold Pulse Width: 0.4 ms
Lead Channel Sensing Intrinsic Amplitude: 11.2 mV
Lead Channel Setting Pacing Amplitude: 2 V
Lead Channel Setting Pacing Pulse Width: 0.4 ms
MDC IDC LEAD IMPLANT DT: 20071115
MDC IDC LEAD LOCATION: 753859
MDC IDC MSMT BATTERY IMPEDANCE: 100 Ohm
MDC IDC MSMT BATTERY VOLTAGE: 2.79 V
MDC IDC MSMT LEADCHNL RA IMPEDANCE VALUE: 517 Ohm
MDC IDC MSMT LEADCHNL RA PACING THRESHOLD PULSEWIDTH: 0.4 ms
MDC IDC MSMT LEADCHNL RV IMPEDANCE VALUE: 627 Ohm
MDC IDC SET LEADCHNL RV PACING AMPLITUDE: 2.5 V
MDC IDC SET LEADCHNL RV SENSING SENSITIVITY: 5.6 mV
MDC IDC STAT BRADY AP VS PERCENT: 89 %
MDC IDC STAT BRADY AS VP PERCENT: 0 %

## 2016-05-03 ENCOUNTER — Encounter: Payer: Self-pay | Admitting: Cardiology

## 2016-05-17 ENCOUNTER — Inpatient Hospital Stay: Payer: Medicare Other | Attending: Internal Medicine

## 2016-05-17 ENCOUNTER — Inpatient Hospital Stay (HOSPITAL_BASED_OUTPATIENT_CLINIC_OR_DEPARTMENT_OTHER): Payer: Medicare Other | Admitting: Family Medicine

## 2016-05-17 VITALS — BP 150/77 | HR 73 | Temp 97.1°F | Resp 18 | Wt 169.3 lb

## 2016-05-17 DIAGNOSIS — D696 Thrombocytopenia, unspecified: Secondary | ICD-10-CM | POA: Diagnosis not present

## 2016-05-17 DIAGNOSIS — I4891 Unspecified atrial fibrillation: Secondary | ICD-10-CM | POA: Diagnosis not present

## 2016-05-17 DIAGNOSIS — I10 Essential (primary) hypertension: Secondary | ICD-10-CM | POA: Diagnosis not present

## 2016-05-17 DIAGNOSIS — E039 Hypothyroidism, unspecified: Secondary | ICD-10-CM

## 2016-05-17 DIAGNOSIS — Z7901 Long term (current) use of anticoagulants: Secondary | ICD-10-CM | POA: Insufficient documentation

## 2016-05-17 DIAGNOSIS — Z79899 Other long term (current) drug therapy: Secondary | ICD-10-CM | POA: Diagnosis not present

## 2016-05-17 DIAGNOSIS — E785 Hyperlipidemia, unspecified: Secondary | ICD-10-CM

## 2016-05-17 DIAGNOSIS — M129 Arthropathy, unspecified: Secondary | ICD-10-CM

## 2016-05-17 LAB — CBC WITH DIFFERENTIAL/PLATELET
BASOS PCT: 1 %
Basophils Absolute: 0 10*3/uL (ref 0–0.1)
Eosinophils Absolute: 0.1 10*3/uL (ref 0–0.7)
Eosinophils Relative: 2 %
HEMATOCRIT: 42.8 % (ref 40.0–52.0)
HEMOGLOBIN: 14.3 g/dL (ref 13.0–18.0)
LYMPHS ABS: 1.7 10*3/uL (ref 1.0–3.6)
Lymphocytes Relative: 31 %
MCH: 30.2 pg (ref 26.0–34.0)
MCHC: 33.4 g/dL (ref 32.0–36.0)
MCV: 90.5 fL (ref 80.0–100.0)
MONO ABS: 0.5 10*3/uL (ref 0.2–1.0)
MONOS PCT: 9 %
NEUTROS ABS: 3 10*3/uL (ref 1.4–6.5)
NEUTROS PCT: 57 %
Platelets: 134 10*3/uL — ABNORMAL LOW (ref 150–440)
RBC: 4.73 MIL/uL (ref 4.40–5.90)
RDW: 13 % (ref 11.5–14.5)
WBC: 5.3 10*3/uL (ref 3.8–10.6)

## 2016-05-17 NOTE — Progress Notes (Signed)
[place]  [phone] [phone]     ID: THAXTON PELLEY OB: 1933/01/04  MR#: [id number]  KDT#:[id number]  Patient Care Team: Lavera Guise, MD as PCP - General (Internal Medicine)  CHIEF COMPLAINT/DIAGNOSIS:  Thrombocytopenia of unclear etiology  -  ? Mild ITP vs other etiology. (Labs on 04/18/15 showed platelet count below normal range at 144 with creatinine of 1.15, calcium of 9.5. Platelet count in December 2015 was 144, hemoglobin 14.6, WBC 4700 with unremarkable differential, creatinine 1.20. In April 2016, hemoglobin 15.7, WBC 5300 with unremarkable differential, platelets low at 131, creatinine 1.16).   Workup done on 05/10/15 - Hb 15.1, platelets 123, WBC 6400, ferritin 120, serum iron 62, iron saturation slightly low at 17%. Otherwise serum B-12, folate, SPEP, kappa/lambda ratio (1.4), PT, PTT, HIV antibody, HBsAg, platelet antibody test all unremarkable      HISTORY OF PRESENT ILLNESS:  Patient is here for further hematology follow-up regarding thrombocytopenia. Patient was previously followed by Dr. Ma Hillock and was last seen in August 2016. Patient reports overall feeling very well. 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 He does have some chronic joint pain. Patient denies any easy bleeding, unusual bruising, GI bleeding, dark or tarry stools.   REVIEW OF SYSTEMS:   Review of Systems  Constitutional: Negative for fever, chills, weight loss, malaise/fatigue and diaphoresis.  HENT: Negative.   Eyes: Negative.   Respiratory: Negative for cough, hemoptysis, sputum production, shortness of breath and wheezing.   Cardiovascular: Negative for chest pain, palpitations, orthopnea, claudication, leg swelling and PND.  Gastrointestinal: Negative for heartburn, nausea, vomiting, abdominal pain, diarrhea, constipation, blood in stool and melena.  Genitourinary: Negative.   Musculoskeletal: Positive for joint pain.         Chronic  Skin: Negative.   Neurological: Negative for dizziness, tingling, focal weakness, seizures and weakness.  Endo/Heme/Allergies: Does not bruise/bleed easily.  Psychiatric/Behavioral: Negative for depression. The patient is not nervous/anxious and does not have insomnia.     PAST MEDICAL HISTORY: Reviewed. Past Medical History  Diagnosis Date  . Hyperlipidemia   . Hypothyroidism   . Paroxysmal atrial fibrillation ([place]) 11/22/2014  . Hypertension   . Arthritis   . Thrombocytopenia ([place]) 05/23/2015    PAST SURGICAL HISTORY: Reviewed. Past Surgical History  Procedure Laterality Date  . Placement of permanent pacemaker    . Coronary artery bypass graft      x3 using a left internal mammary artery  . Cardiac catheterization      Dr. Tami Ribas which showed a 95% ostial LAD stenosis  . US echocardiography      lft ventricular ejection fraction was about 60%  . Bilateral carotid duplex ultrasound showed no ica      stenosis  . Pacemaker generator change N/A 08/09/2014    Procedure: PACEMAKER GENERATOR CHANGE Sheran Spine;  Surgeon: Sanda Klein, MD;  Location: [facility];  Service: Cardiovascular;  Laterality: N/A;    FAMILY HISTORY: Reviewed. Family History  Problem Relation Age of Onset  . Hypertension Mother   . Stroke Mother     SOCIAL HISTORY: Reviewed. Social History  Substance Use Topics  . Smoking status: Never Smoker   . Smokeless tobacco: Not on file  . Alcohol Use: Yes     Comment: occas.    Allergies  Allergen Reactions  . Crestor [Rosuvastatin]     Myalgias; high doses  . Latex Itching    PT STATES THAT AFTER  WEARING LATEX GLOVES TO WORK IN THAT HIS HANDS WERE VERY ITCHY.  . Sulfa Antibiotics Rash    Current Outpatient Prescriptions  Medication Sig Dispense Refill  . CRESTOR 10 MG tablet TAKE ONE TABLET BY MOUTH EVERY OTHER DAY 15 tablet 10  . ELIQUIS 5 MG TABS tablet TAKE ONE TABLET TWICE DAILY 60 tablet 5  . Ferrous Sulfate Dried (FERROUS  SULFATE CR PO) Take by mouth daily.    Marland Kitchen liothyronine (CYTOMEL) 5 MCG tablet Take 5 mcg by mouth daily.    . metoprolol succinate (TOPROL-XL) 25 MG 24 hr tablet TAKE ONE TABLET BY MOUTH EVERY DAY 30 tablet 10  . SYNTHROID 100 MCG tablet Take 100 mg by mouth daily.     No current facility-administered medications for this visit.    PHYSICAL EXAM: Filed Vitals:   05/17/16 1142  BP: 150/77  Pulse: 73  Temp: 97.1 F (36.2 C)  Resp: 18     Body mass index is 24.99 kg/(m^2).     GENERAL: Patient is alert and oriented and in no acute distress. There is no icterus. HEENT: EOMs intact. Oral exam negative for thrush or petechiae.  LUNGS: Bilaterally clear to auscultation, no rhonchi. Cardiovascular: Regular rate and rhythm. No murmurs, gallops, or rubs. ABDOMEN: Soft, nontender.  SKIN: No rashes, petechiae or major bruising   LAB RESULTS: 04/18/15 - platelet count below normal range at 144 with creatinine of 1.15, calcium of 9.5.  April 2016 - hemoglobin 15.7, WBC 5300 with unremarkable differential, platelets low at 131, creatinine 1.16. Dec 2015 - platelet count 144, hemoglobin 14.6, WBC 4700 with unremarkable differential, creatinine 1.20.  STUDIES:   ASSESSMENT / PLAN:   Thrombocytopenia of unclear etiology. Patient had a previous bone marrow biopsy which showed mild dyserythropoiesis and mildly hypocellular bone marrow but otherwise is unremarkable. Cytogenetics were also unremarkable. Patient's platelet count remained stable at 134K. patient did have an ultrasound of the abdomen for reevaluation of thrombocytopenia, there is no Hepatomegaly. There is a stable cystic lesion in the right hepatic lobe. No other acute abnormalities found.   Patient advised that we would continue with follow-up in approximately 4 months for lab recheck it again in 8 months to see provider and for follow-up labs.  In between visits, the patient has been advised to call or come to ER in case of fevers,  bleeding, acute sickness or new symptoms. He is agreeable to this plan.  Dr. Rogue Bussing was available for consultation and review of plan of care for this patient.   Evlyn Kanner, NP   05/17/2016 2:58 PM

## 2016-06-11 ENCOUNTER — Other Ambulatory Visit: Payer: Self-pay | Admitting: Cardiovascular Disease

## 2016-06-11 NOTE — Telephone Encounter (Signed)
Rx sent to pharmacy   

## 2016-06-27 ENCOUNTER — Ambulatory Visit (INDEPENDENT_AMBULATORY_CARE_PROVIDER_SITE_OTHER): Payer: Medicare Other | Admitting: *Deleted

## 2016-06-27 ENCOUNTER — Telehealth: Payer: Self-pay | Admitting: Cardiology

## 2016-06-27 DIAGNOSIS — I495 Sick sinus syndrome: Secondary | ICD-10-CM

## 2016-06-27 NOTE — Telephone Encounter (Signed)
Confirmed remote transmission w/ pt wife.   

## 2016-06-28 ENCOUNTER — Encounter: Payer: Self-pay | Admitting: Cardiology

## 2016-06-28 NOTE — Progress Notes (Signed)
Remote pacemaker transmission.   

## 2016-07-16 LAB — CUP PACEART REMOTE DEVICE CHECK
Battery Remaining Longevity: 142 mo
Battery Voltage: 2.79 V
Brady Statistic AS VP Percent: 0 %
Date Time Interrogation Session: 20170817203547
Implantable Lead Implant Date: 20071115
Implantable Lead Location: 753860
Implantable Lead Model: 4092
Implantable Lead Model: 5076
Lead Channel Pacing Threshold Amplitude: 0.5 V
Lead Channel Pacing Threshold Pulse Width: 0.4 ms
Lead Channel Pacing Threshold Pulse Width: 0.4 ms
Lead Channel Setting Pacing Amplitude: 2.5 V
Lead Channel Setting Pacing Pulse Width: 0.4 ms
Lead Channel Setting Sensing Sensitivity: 5.6 mV
MDC IDC LEAD IMPLANT DT: 20071115
MDC IDC LEAD LOCATION: 753859
MDC IDC MSMT BATTERY IMPEDANCE: 112 Ohm
MDC IDC MSMT LEADCHNL RA IMPEDANCE VALUE: 517 Ohm
MDC IDC MSMT LEADCHNL RA PACING THRESHOLD AMPLITUDE: 0.625 V
MDC IDC MSMT LEADCHNL RV IMPEDANCE VALUE: 647 Ohm
MDC IDC MSMT LEADCHNL RV SENSING INTR AMPL: 11.2 mV
MDC IDC SET LEADCHNL RA PACING AMPLITUDE: 2 V
MDC IDC STAT BRADY AP VP PERCENT: 0 %
MDC IDC STAT BRADY AP VS PERCENT: 90 %
MDC IDC STAT BRADY AS VS PERCENT: 10 %

## 2016-08-09 ENCOUNTER — Other Ambulatory Visit: Payer: Self-pay | Admitting: Cardiovascular Disease

## 2016-09-17 ENCOUNTER — Inpatient Hospital Stay: Payer: Medicare Other | Attending: Internal Medicine | Admitting: *Deleted

## 2016-09-17 DIAGNOSIS — D696 Thrombocytopenia, unspecified: Secondary | ICD-10-CM | POA: Diagnosis present

## 2016-09-17 LAB — CBC WITH DIFFERENTIAL/PLATELET
BASOS ABS: 0 10*3/uL (ref 0–0.1)
Basophils Relative: 1 %
EOS ABS: 0.1 10*3/uL (ref 0–0.7)
EOS PCT: 3 %
HCT: 42.5 % (ref 40.0–52.0)
Hemoglobin: 14.3 g/dL (ref 13.0–18.0)
Lymphocytes Relative: 32 %
Lymphs Abs: 1.6 10*3/uL (ref 1.0–3.6)
MCH: 30.3 pg (ref 26.0–34.0)
MCHC: 33.7 g/dL (ref 32.0–36.0)
MCV: 90 fL (ref 80.0–100.0)
MONO ABS: 0.4 10*3/uL (ref 0.2–1.0)
Monocytes Relative: 9 %
Neutro Abs: 2.7 10*3/uL (ref 1.4–6.5)
Neutrophils Relative %: 55 %
PLATELETS: 114 10*3/uL — AB (ref 150–440)
RBC: 4.72 MIL/uL (ref 4.40–5.90)
RDW: 13.5 % (ref 11.5–14.5)
WBC: 4.9 10*3/uL (ref 3.8–10.6)

## 2016-09-23 ENCOUNTER — Telehealth: Payer: Self-pay

## 2016-09-23 NOTE — Telephone Encounter (Signed)
-----   Message from Earna CoderGovinda R Brahmanday, MD sent at 09/20/2016  4:56 PM EST ----- Please inform patient that platelets are slightly low at 114/compared to previous counts. Monitor for now; follow-up as planned.

## 2016-09-23 NOTE — Telephone Encounter (Signed)
Advised patient of message below and voiced understanding

## 2016-10-01 ENCOUNTER — Other Ambulatory Visit: Payer: Self-pay

## 2016-10-01 ENCOUNTER — Encounter: Payer: Self-pay | Admitting: Cardiovascular Disease

## 2016-10-01 ENCOUNTER — Ambulatory Visit (INDEPENDENT_AMBULATORY_CARE_PROVIDER_SITE_OTHER): Payer: Medicare Other | Admitting: Cardiovascular Disease

## 2016-10-01 VITALS — BP 139/61 | HR 69 | Ht 69.0 in | Wt 167.0 lb

## 2016-10-01 DIAGNOSIS — I2510 Atherosclerotic heart disease of native coronary artery without angina pectoris: Secondary | ICD-10-CM

## 2016-10-01 DIAGNOSIS — E7800 Pure hypercholesterolemia, unspecified: Secondary | ICD-10-CM

## 2016-10-01 DIAGNOSIS — I10 Essential (primary) hypertension: Secondary | ICD-10-CM

## 2016-10-01 DIAGNOSIS — I480 Paroxysmal atrial fibrillation: Secondary | ICD-10-CM

## 2016-10-01 DIAGNOSIS — Z950 Presence of cardiac pacemaker: Secondary | ICD-10-CM | POA: Diagnosis not present

## 2016-10-01 DIAGNOSIS — I495 Sick sinus syndrome: Secondary | ICD-10-CM

## 2016-10-01 LAB — CUP PACEART INCLINIC DEVICE CHECK
Battery Impedance: 135 Ohm
Battery Remaining Longevity: 134 mo
Battery Voltage: 2.79 V
Brady Statistic AS VS Percent: 9 %
Date Time Interrogation Session: 20171121144442
Implantable Lead Implant Date: 20071115
Implantable Lead Location: 753859
Implantable Lead Model: 4092
Lead Channel Impedance Value: 627 Ohm
Lead Channel Setting Pacing Amplitude: 2 V
Lead Channel Setting Pacing Amplitude: 2.5 V
Lead Channel Setting Pacing Pulse Width: 0.4 ms
Lead Channel Setting Sensing Sensitivity: 5.6 mV
MDC IDC LEAD IMPLANT DT: 20071115
MDC IDC LEAD LOCATION: 753860
MDC IDC MSMT LEADCHNL RA IMPEDANCE VALUE: 500 Ohm
MDC IDC MSMT LEADCHNL RA PACING THRESHOLD AMPLITUDE: 0.5 V
MDC IDC MSMT LEADCHNL RA PACING THRESHOLD PULSEWIDTH: 0.4 ms
MDC IDC MSMT LEADCHNL RV PACING THRESHOLD AMPLITUDE: 0.5 V
MDC IDC MSMT LEADCHNL RV PACING THRESHOLD PULSEWIDTH: 0.4 ms
MDC IDC PG IMPLANT DT: 20150929
MDC IDC STAT BRADY AP VP PERCENT: 0 %
MDC IDC STAT BRADY AP VS PERCENT: 91 %
MDC IDC STAT BRADY AS VP PERCENT: 0 %

## 2016-10-01 MED ORDER — METOPROLOL SUCCINATE ER 25 MG PO TB24: 25.0000 mg | ORAL_TABLET | Freq: Every day | ORAL | 11 refills | Status: DC

## 2016-10-01 NOTE — Patient Instructions (Signed)
Dr Croitoru recommends that you continue on your current medications as directed. Please refer to the Current Medication list given to you today.  Remote monitoring is used to monitor your Pacemaker of ICD from home. This monitoring reduces the number of office visits required to check your device to one time per year. It allows us to keep an eye on the functioning of your device to ensure it is working properly. You are scheduled for a device check from home on Tuesday, February 20th, 2018. You may send your transmission at any time that day. If you have a wireless device, the transmission will be sent automatically. After your physician reviews your transmission, you will receive a postcard with your next transmission date.  Dr Croitoru recommends that you schedule a follow-up appointment in 12 months with a pacemaker check. You will receive a reminder letter in the mail two months in advance. If you don't receive a letter, please call our office to schedule the follow-up appointment.  If you need a refill on your cardiac medications before your next appointment, please call your pharmacy. 

## 2016-10-01 NOTE — Progress Notes (Signed)
Cardiology Office Note    Date:  10/02/2016   ID:  Ian AnoRonald E Barker, DOB 08/17/1933, MRN [id number]  PCP:  Lyndon CodeKHAN, FOZIA M, MD  Cardiologist:   Thurmon FairMihai Elis Rawlinson, MD   Chief Complaint  Patient presents with  . Follow-up    History of Present Illness:  [name] is a 82 y.o. male with CAD s/p CABG, sinus node dysfunction and paroxysmal atrial fibrillation, status post dual-chamber permanent pacemaker implantation in 2007 (generator change 2015, Medtronic Adapta) returning for follow-up. He remains very physically active. He regularly plays doubles tennis, as recently as yesterday. He denies problems with shortness of breath or chest pain. His agility is just less than it used to be in the past. He has not had syncope and has not been troubled by palpitations. He denies leg edema orthopnea or dyspnea.  He is compliant with anticoagulation with Eliquis and has not had any bleeding problems. He denies any focal neurological events.  Interrogation of his pacemaker shows normal device function with estimated generator longevity of 11 years. There is 91% atrial pacing and only 0.1% ventricular pacing. During the last year he has only had one on her episodes of atrial fibrillation. This occurred on [birth year] which is also his birthday and lasted for about 20 hours. He was asymptomatic and a ventricular rate control was adequate.  Normal left ventricular systolic function by echo most recently performed in 2014. Last nuclear stress test in 2012 was normal. He underwent bypass surgery in 2007 (Dr. Laneta SimmersBartle 2007, LIMA to LAD, SVG to OM, SVG to RCA). He has treated hyperlipidemia, followed by his primary care physician. He has mild thrombocytopenia without bleeding complications.  Past Medical History:  Diagnosis Date  . Arthritis   . Hyperlipidemia   . Hypertension   . Hypothyroidism   . Paroxysmal atrial fibrillation (HCC) 11/22/2014  . Thrombocytopenia (HCC) 05/23/2015    Past Surgical History:    Procedure Laterality Date  . bilateral carotid duplex ultrasound showed no ICA     stenosis  . CARDIAC CATHETERIZATION     Dr. Jenne CampusMcQueen which showed a 95% ostial LAD stenosis  . CORONARY ARTERY BYPASS GRAFT     x3 using a left internal mammary artery  . PACEMAKER GENERATOR CHANGE N/A 08/09/2014   Procedure: PACEMAKER GENERATOR CHANGE Donny Pique/BATTERY;  Surgeon: Thurmon FairMihai Rekita Miotke, MD;  Location: [facility] CATH [facility];  Service: Cardiovascular;  Laterality: N/A;  . Placement of permanent pacemaker    . US ECHOCARDIOGRAPHY     lft ventricular ejection fraction was about 60%    Current Medications: Outpatient Medications Prior to Visit  Medication Sig Dispense Refill  . ELIQUIS 5 MG TABS tablet TAKE ONE TABLET BY MOUTH TWICE DAILY 60 tablet 1  . rosuvastatin (CRESTOR) 10 MG tablet TAKE ONE TABLET EVERY OTHER DAY 15 tablet 3  . SYNTHROID 100 MCG tablet Take 100 mg by mouth daily.    . metoprolol succinate (TOPROL-XL) 25 MG 24 hr tablet TAKE ONE TABLET BY MOUTH EVERY DAY 30 tablet 10  . Ferrous Sulfate Dried (FERROUS SULFATE CR PO) Take by mouth daily.    Marland Kitchen. liothyronine (CYTOMEL) 5 MCG tablet Take 5 mcg by mouth daily.     No facility-administered medications prior to visit.      Allergies:   Crestor [rosuvastatin]; Latex; and Sulfa antibiotics   Social History   Social History  . Marital status: Married    Spouse name: N/A  . Number of children: N/A  . Years of  education: N/A   Social History Main Topics  . Smoking status: Never Smoker  . Smokeless tobacco: None  . Alcohol use Yes     Comment: occas.  . Drug use: No  . Sexual activity: Not Asked   Other Topics Concern  . None   Social History Narrative  . None     Family History:  The patient's family history includes Hypertension in his mother; Stroke in his mother.   ROS:   Please see the history of present illness.    ROS All other systems reviewed and are negative.   PHYSICAL EXAM:   VS:  BP 139/61 (BP Location: Left Arm,  Patient Position: Sitting, Cuff Size: Normal)   Pulse 69   Ht 5\' 9"  (1.753 m)   Wt 167 lb (75.8 kg)   SpO2 99%   BMI 24.66 kg/m    GEN: Well nourished, well developed, in no acute distress  HEENT: normal  Neck: no JVD, carotid bruits, or masses Cardiac: RRR; no murmurs, rubs, or gallops,no edema , healthy left subclavian pacemaker site Respiratory:  clear to auscultation bilaterally, normal work of breathing GI: soft, nontender, nondistended, + BS MS: no deformity or atrophy  Skin: warm and dry, no rash Neuro:  Alert and Oriented x 3, Strength and sensation are intact Psych: euthymic mood, full affect  Wt Readings from Last 3 Encounters:  10/01/16 167 lb (75.8 kg)  05/17/16 169 lb 5 oz (76.8 kg)  09/26/15 164 lb 1.6 oz (74.4 kg)      Studies/Labs Reviewed:   EKG:  EKG is ordered today.  The ekg ordered today demonstrates Atrial paced ventricular sensed rhythm, otherwise normal. QTC 437 ms.  Recent Labs: 09/17/2016: Hemoglobin 14.3; Platelets 114     ASSESSMENT:    1. Paroxysmal atrial fibrillation (HCC)   2. SSS (sick sinus syndrome) (HCC)   3. Pacemaker   4. Coronary artery disease involving native coronary artery of native heart without angina pectoris   5. Essential hypertension   6. Pure hypercholesterolemia      PLAN:  In order of problems listed above:  1. AFib: Infrequent, well rate controlled, on appropriate anticoagulation.CHADSVasc 3 (age 23, HTN). 2. SSS: Heart rate histogram shows appropriate sensor settings. He does not have symptoms of chronotropic incompetence. 3. PPM: Normal device function. Continue remote downloads every 3 months and yearly office visits. 4. CAD: Asymptomatic, 10 years after bypass surgery. 5. HTN: Adequately treated 6. HLP: Get results from PCP. On statin.    Medication Adjustments/Labs and Tests Ordered: Current medicines are reviewed at length with the patient today.  Concerns regarding medicines are outlined above.   Medication changes, Labs and Tests ordered today are listed in the Patient Instructions below. Patient Instructions  Dr Royann Shivers recommends that you continue on your current medications as directed. Please refer to the Current Medication list given to you today.  Remote monitoring is used to monitor your Pacemaker of ICD from home. This monitoring reduces the number of office visits required to check your device to one time per year. It allows [place] to keep an eye on the functioning of your device to ensure it is working properly. You are scheduled for a device check from home on Tuesday, February 20th, 2018. You may send your transmission at any time that day. If you have a wireless device, the transmission will be sent automatically. After your physician reviews your transmission, you will receive a postcard with your next transmission date.  Dr Royann Shivers  recommends that you schedule a follow-up appointment in 12 months with a pacemaker check. You will receive a reminder letter in the mail two months in advance. If you don't receive a letter, please call our office to schedule the follow-up appointment.  If you need a refill on your cardiac medications before your next appointment, please call your pharmacy.    Signed, Thurmon FairMihai Eddy Liszewski, MD  10/02/2016 8:36 AM    [facility] [address], [place], [place]  [address] Phone: [phone](336) [phone]; Fax: [phone](336) [phone]

## 2016-10-02 ENCOUNTER — Encounter: Payer: Self-pay | Admitting: Cardiovascular Disease

## 2016-10-09 ENCOUNTER — Other Ambulatory Visit: Payer: Self-pay | Admitting: Cardiovascular Disease

## 2016-12-31 ENCOUNTER — Ambulatory Visit (INDEPENDENT_AMBULATORY_CARE_PROVIDER_SITE_OTHER): Payer: Medicare Other | Admitting: *Deleted

## 2016-12-31 ENCOUNTER — Telehealth: Payer: Self-pay | Admitting: Cardiology

## 2016-12-31 DIAGNOSIS — I495 Sick sinus syndrome: Secondary | ICD-10-CM | POA: Diagnosis not present

## 2016-12-31 NOTE — Telephone Encounter (Signed)
LMOVM reminding pt to send remote transmission.   

## 2017-01-01 ENCOUNTER — Encounter: Payer: Self-pay | Admitting: Cardiology

## 2017-01-01 LAB — CUP PACEART REMOTE DEVICE CHECK
Battery Impedance: 135 Ohm
Brady Statistic AP VS Percent: 91 %
Brady Statistic AS VS Percent: 9 %
Date Time Interrogation Session: 20180220210326
Implantable Lead Implant Date: 20071115
Implantable Lead Location: 753860
Implantable Lead Model: 4092
Lead Channel Impedance Value: 642 Ohm
Lead Channel Pacing Threshold Pulse Width: 0.4 ms
Lead Channel Pacing Threshold Pulse Width: 0.4 ms
MDC IDC LEAD IMPLANT DT: 20071115
MDC IDC LEAD LOCATION: 753859
MDC IDC MSMT BATTERY REMAINING LONGEVITY: 134 mo
MDC IDC MSMT BATTERY VOLTAGE: 2.79 V
MDC IDC MSMT LEADCHNL RA IMPEDANCE VALUE: 494 Ohm
MDC IDC MSMT LEADCHNL RA PACING THRESHOLD AMPLITUDE: 0.5 V
MDC IDC MSMT LEADCHNL RV PACING THRESHOLD AMPLITUDE: 0.5 V
MDC IDC PG IMPLANT DT: 20150929
MDC IDC SET LEADCHNL RA PACING AMPLITUDE: 2 V
MDC IDC SET LEADCHNL RV PACING AMPLITUDE: 2.5 V
MDC IDC SET LEADCHNL RV PACING PULSEWIDTH: 0.4 ms
MDC IDC SET LEADCHNL RV SENSING SENSITIVITY: 5.6 mV
MDC IDC STAT BRADY AP VP PERCENT: 0 %
MDC IDC STAT BRADY AS VP PERCENT: 0 %

## 2017-01-01 NOTE — Progress Notes (Signed)
Remote pacemaker transmission.   

## 2017-01-15 ENCOUNTER — Other Ambulatory Visit: Payer: Medicare Other

## 2017-01-15 ENCOUNTER — Encounter: Payer: Self-pay | Admitting: Cardiology

## 2017-01-15 ENCOUNTER — Ambulatory Visit: Payer: Medicare Other | Admitting: Internal Medicine

## 2017-01-20 ENCOUNTER — Other Ambulatory Visit: Payer: Self-pay | Admitting: *Deleted

## 2017-01-20 DIAGNOSIS — D696 Thrombocytopenia, unspecified: Secondary | ICD-10-CM

## 2017-01-21 ENCOUNTER — Telehealth: Payer: Self-pay | Admitting: *Deleted

## 2017-01-21 ENCOUNTER — Inpatient Hospital Stay: Payer: Medicare Other | Attending: Oncology

## 2017-01-21 ENCOUNTER — Inpatient Hospital Stay (HOSPITAL_BASED_OUTPATIENT_CLINIC_OR_DEPARTMENT_OTHER): Payer: Medicare Other | Admitting: Oncology

## 2017-01-21 VITALS — BP 184/73 | HR 78 | Temp 97.2°F | Resp 18

## 2017-01-21 DIAGNOSIS — I10 Essential (primary) hypertension: Secondary | ICD-10-CM | POA: Insufficient documentation

## 2017-01-21 DIAGNOSIS — D696 Thrombocytopenia, unspecified: Secondary | ICD-10-CM

## 2017-01-21 DIAGNOSIS — E785 Hyperlipidemia, unspecified: Secondary | ICD-10-CM | POA: Diagnosis not present

## 2017-01-21 DIAGNOSIS — Z79899 Other long term (current) drug therapy: Secondary | ICD-10-CM

## 2017-01-21 DIAGNOSIS — D693 Immune thrombocytopenic purpura: Secondary | ICD-10-CM

## 2017-01-21 DIAGNOSIS — Z7901 Long term (current) use of anticoagulants: Secondary | ICD-10-CM

## 2017-01-21 DIAGNOSIS — E039 Hypothyroidism, unspecified: Secondary | ICD-10-CM | POA: Insufficient documentation

## 2017-01-21 DIAGNOSIS — I480 Paroxysmal atrial fibrillation: Secondary | ICD-10-CM | POA: Diagnosis not present

## 2017-01-21 LAB — CBC WITH DIFFERENTIAL/PLATELET
BASOS ABS: 0 10*3/uL (ref 0–0.1)
BASOS PCT: 0 %
EOS PCT: 2 %
Eosinophils Absolute: 0.1 10*3/uL (ref 0–0.7)
HCT: 45 % (ref 40.0–52.0)
Hemoglobin: 15.3 g/dL (ref 13.0–18.0)
Lymphocytes Relative: 27 %
Lymphs Abs: 1.7 10*3/uL (ref 1.0–3.6)
MCH: 30.4 pg (ref 26.0–34.0)
MCHC: 34.1 g/dL (ref 32.0–36.0)
MCV: 89.2 fL (ref 80.0–100.0)
MONO ABS: 0.5 10*3/uL (ref 0.2–1.0)
Monocytes Relative: 8 %
Neutro Abs: 4 10*3/uL (ref 1.4–6.5)
Neutrophils Relative %: 63 %
PLATELETS: 138 10*3/uL — AB (ref 150–440)
RBC: 5.04 MIL/uL (ref 4.40–5.90)
RDW: 13.5 % (ref 11.5–14.5)
WBC: 6.3 10*3/uL (ref 3.8–10.6)

## 2017-01-21 NOTE — Telephone Encounter (Signed)
Called pt to let him know that his b/p was elevated while In clinic today. He said it was elevated this am when he checked it at home. He got frustrated with the politics he saw on tv . He did take his med today and he will check it this afternoon. He has decided to stay off of that channel and watch relaxing tennis or basketball.  If his b/p still remains elevated he will contact PCP.  He feels like it is down because heis so relaxed now

## 2017-01-21 NOTE — Progress Notes (Signed)
Hematology/Oncology Consult note [facility]  Telephone:([phone] [phone]  Patient Care Team: Lavera Guise, MD as PCP - General (Internal Medicine)   Name of the patient: Isiah Scheel  [id number]  06-Feb-1933   Date of visit: 01/21/17  Diagnosis- thrombocytopenia likely due to ITP  Chief complaint/ Reason for visit- routine f/u   Heme/Onc history: Patient is a 83 year old male who has been seeing [place] in the past for thrombocytopenia. He was last seen in July 2017. His platelet count typically ranges between 120s to 140s. Patient had a workup including HIV and hepatitis B testing as well as B12 folate and myeloma panel which were unremarkable. He is also had a bone marrow biopsy in the past showed mild disc erythropoiesis and mildly hypocellular bone marrow. Cytogenetics were unremarkable. He also had ultrasound of his abdomen which did not show any evidence of hepatosplenomegaly.  Interval history- doing well. Denies any complaints    Review of systems- Review of Systems  Constitutional: Negative for chills, fever, malaise/fatigue and weight loss.  HENT: Negative for congestion, ear discharge and nosebleeds.   Eyes: Negative for blurred vision.  Respiratory: Negative for cough, hemoptysis, sputum production, shortness of breath and wheezing.   Cardiovascular: Negative for chest pain, palpitations, orthopnea and claudication.  Gastrointestinal: Negative for abdominal pain, blood in stool, constipation, diarrhea, heartburn, melena, nausea and vomiting.  Genitourinary: Negative for dysuria, flank pain, frequency, hematuria and urgency.  Musculoskeletal: Negative for back pain, joint pain and myalgias.  Skin: Negative for rash.  Neurological: Negative for dizziness, tingling, focal weakness, seizures, weakness and headaches.  Endo/Heme/Allergies: Does not bruise/bleed easily.  Psychiatric/Behavioral: Negative for depression and suicidal ideas. The  patient does not have insomnia.      Current treatment- observation  Allergies  Allergen Reactions  . Crestor [Rosuvastatin]     Myalgias; high doses  . Latex Itching    PT STATES THAT AFTER WEARING LATEX GLOVES TO WORK IN THAT HIS HANDS WERE VERY ITCHY.  . Sulfa Antibiotics Rash     Past Medical History:  Diagnosis Date  . Arthritis   . Hyperlipidemia   . Hypertension   . Hypothyroidism   . Paroxysmal atrial fibrillation ([place]) 11/22/2014  . Thrombocytopenia ([place]) 05/23/2015     Past Surgical History:  Procedure Laterality Date  . bilateral carotid duplex ultrasound showed no ICA     stenosis  . CARDIAC CATHETERIZATION     Dr. Tami Ribas which showed a 95% ostial LAD stenosis  . CORONARY ARTERY BYPASS GRAFT     x3 using a left internal mammary artery  . PACEMAKER GENERATOR CHANGE N/A 08/09/2014   Procedure: PACEMAKER GENERATOR CHANGE Sheran Spine;  Surgeon: Sanda Klein, MD;  Location: [facility] CATH LAB;  Service: Cardiovascular;  Laterality: N/A;  . Placement of permanent pacemaker    . US ECHOCARDIOGRAPHY     lft ventricular ejection fraction was about 60%    Social History   Social History  . Marital status: Married    Spouse name: N/A  . Number of children: N/A  . Years of education: N/A   Occupational History  . Not on file.   Social History Main Topics  . Smoking status: Never Smoker  . Smokeless tobacco: Not on file  . Alcohol use Yes     Comment: occas.  . Drug use: No  . Sexual activity: Not on file   Other Topics Concern  . Not on file   Social History Narrative  .  No narrative on file    Family History  Problem Relation Age of Onset  . Hypertension Mother   . Stroke Mother      Current Outpatient Prescriptions:  .  ELIQUIS 5 MG TABS tablet, TAKE ONE TABLET BY MOUTH TWICE DAILY, Disp: 60 tablet, Rfl: 11 .  metoprolol succinate (TOPROL-XL) 25 MG 24 hr tablet, Take 1 tablet (25 mg total) by mouth daily., Disp: 30 tablet, Rfl: 11 .  rosuvastatin  (CRESTOR) 10 MG tablet, TAKE ONE TABLET BY MOUTH EVERY OTHER DAY, Disp: 15 tablet, Rfl: 11 .  SYNTHROID 100 MCG tablet, Take 100 mg by mouth daily., Disp: , Rfl:   Physical exam:  Vitals:   01/21/17 1050  BP: (!) 184/73  Pulse: 78  Resp: 18  Temp: 97.2 F (36.2 C)  TempSrc: Tympanic   Physical Exam  Constitutional: He is oriented to person, place, and time and well-developed, well-nourished, and in no distress.  HENT:  Head: Normocephalic and atraumatic.  Eyes: EOM are normal. Pupils are equal, round, and reactive to light.  Neck: Normal range of motion.  Cardiovascular: Normal rate, regular rhythm and normal heart sounds.   Pulmonary/Chest: Effort normal and breath sounds normal.  Abdominal: Soft. Bowel sounds are normal.  Neurological: He is alert and oriented to person, place, and time.  Skin: Skin is warm and dry.     CMP Latest Ref Rng & Units 08/09/2014  Glucose 70 - 99 mg/dL -  BUN 6 - 23 mg/dL -  Creatinine 0.4 - 1.5 mg/dL -  Sodium 135 - 145 mEq/L -  Potassium 3.7 - 5.3 mEq/L 4.7  Chloride 96 - 112 mEq/L -  CO2 19 - 32 mEq/L -  Calcium 8.4 - 10.5 mg/dL -  Total Protein 6.0 - 8.3 g/dL -  Total Bilirubin 0.3 - 1.2 mg/dL -  Alkaline Phos 39 - 117 U/L -  AST 0 - 37 U/L -  ALT 0 - 53 U/L -   CBC Latest Ref Rng & Units 09/17/2016  WBC 3.8 - 10.6 K/uL 4.9  Hemoglobin 13.0 - 18.0 g/dL 14.3  Hematocrit 40.0 - 52.0 % 42.5  Platelets 150 - 440 K/uL 114(L)      Assessment and plan- Patient is a 83 y.o. male with chronic mild thrombocytopenia likely secondary to ITP  Patient has isolated thrombocytopenia in the absence of other cytopenias. Patient is also had a bone marrow biopsy in the past. His platelet counts have been stable between 120s to 140s. At this time he can continue to follow-up with his primary care doctor and can be referred to [place] in the future if his platelet counts were to consistently go down or if she were to develop other cytopenias  HTN- he is  asymptomatic at this time. We will inform PCP about this.   Visit Diagnosis 1. Chronic ITP (idiopathic thrombocytopenia) (HCC)      Dr. Randa Evens, MD, MPH [facility] Pager- [phone] 01/21/2017 11:17 AM

## 2017-01-21 NOTE — Progress Notes (Signed)
Here for follow up. Feeling good and stated doing well.

## 2017-04-01 ENCOUNTER — Ambulatory Visit (INDEPENDENT_AMBULATORY_CARE_PROVIDER_SITE_OTHER): Payer: Medicare Other | Admitting: *Deleted

## 2017-04-01 DIAGNOSIS — I495 Sick sinus syndrome: Secondary | ICD-10-CM | POA: Diagnosis not present

## 2017-04-02 ENCOUNTER — Encounter: Payer: Self-pay | Admitting: Cardiology

## 2017-04-02 NOTE — Progress Notes (Signed)
Remote pacemaker transmission.   

## 2017-04-03 LAB — CUP PACEART REMOTE DEVICE CHECK
Battery Impedance: 159 Ohm
Battery Voltage: 2.79 V
Brady Statistic AP VS Percent: 92 %
Brady Statistic AS VS Percent: 8 %
Implantable Lead Implant Date: 20071115
Implantable Lead Location: 753859
Implantable Lead Model: 4092
Implantable Lead Model: 5076
Lead Channel Pacing Threshold Amplitude: 0.5 V
Lead Channel Pacing Threshold Pulse Width: 0.4 ms
Lead Channel Pacing Threshold Pulse Width: 0.4 ms
Lead Channel Setting Pacing Amplitude: 2 V
Lead Channel Setting Pacing Pulse Width: 0.4 ms
Lead Channel Setting Sensing Sensitivity: 5.6 mV
MDC IDC LEAD IMPLANT DT: 20071115
MDC IDC LEAD LOCATION: 753860
MDC IDC MSMT BATTERY REMAINING LONGEVITY: 129 mo
MDC IDC MSMT LEADCHNL RA IMPEDANCE VALUE: 501 Ohm
MDC IDC MSMT LEADCHNL RA PACING THRESHOLD AMPLITUDE: 0.625 V
MDC IDC MSMT LEADCHNL RV IMPEDANCE VALUE: 625 Ohm
MDC IDC PG IMPLANT DT: 20150929
MDC IDC SESS DTM: 20180522180135
MDC IDC SET LEADCHNL RV PACING AMPLITUDE: 2.5 V
MDC IDC STAT BRADY AP VP PERCENT: 0 %
MDC IDC STAT BRADY AS VP PERCENT: 0 %

## 2017-04-16 ENCOUNTER — Encounter: Payer: Self-pay | Admitting: Cardiology

## 2017-06-11 ENCOUNTER — Telehealth: Payer: Self-pay | Admitting: Cardiovascular Disease

## 2017-06-11 NOTE — Telephone Encounter (Signed)
Returned call to patient. He states he has not been feeling well for about 1 week. He has a slight headache, he feels washed out. Patient is eating OK and trying to stay hydrated. Patient reports BP has been "up and down quite a bit". Denies chest pain, denies shortness of breath. No weight loss. No recent med changes.   7/22 - 170/80 HR 76 7/31 - 141/77 HR 71  Scheduled patient for MD OV 06/12/17 @ 0900.

## 2017-06-11 NOTE — Telephone Encounter (Signed)
New message    Patient calling not feeling well for the last week or so . C/o lack in energy.   No chest pain   No sob

## 2017-06-12 ENCOUNTER — Ambulatory Visit (INDEPENDENT_AMBULATORY_CARE_PROVIDER_SITE_OTHER): Payer: Medicare Other | Admitting: Cardiovascular Disease

## 2017-06-12 ENCOUNTER — Encounter: Payer: Self-pay | Admitting: Cardiovascular Disease

## 2017-06-12 VITALS — BP 126/66 | HR 76 | Ht 69.0 in | Wt 170.4 lb

## 2017-06-12 DIAGNOSIS — Z950 Presence of cardiac pacemaker: Secondary | ICD-10-CM

## 2017-06-12 DIAGNOSIS — Z7901 Long term (current) use of anticoagulants: Secondary | ICD-10-CM | POA: Diagnosis not present

## 2017-06-12 DIAGNOSIS — E7800 Pure hypercholesterolemia, unspecified: Secondary | ICD-10-CM | POA: Diagnosis not present

## 2017-06-12 DIAGNOSIS — I2510 Atherosclerotic heart disease of native coronary artery without angina pectoris: Secondary | ICD-10-CM

## 2017-06-12 DIAGNOSIS — I480 Paroxysmal atrial fibrillation: Secondary | ICD-10-CM | POA: Diagnosis not present

## 2017-06-12 DIAGNOSIS — I10 Essential (primary) hypertension: Secondary | ICD-10-CM | POA: Diagnosis not present

## 2017-06-12 DIAGNOSIS — I495 Sick sinus syndrome: Secondary | ICD-10-CM

## 2017-06-12 NOTE — Progress Notes (Signed)
Cardiology Office Note    Date:  06/12/2017   ID:  BOLTON CANUPP, DOB 05-11-1933, MRN [id number]  PCP:  Lyndon Code, MD  Cardiologist:   Thurmon Fair, MD   No chief complaint on file.   History of Present Illness:  [name] is a 83 y.o. male with CAD s/p CABG, sinus node dysfunction and paroxysmal atrial fibrillation, status post dual-chamber permanent pacemaker implantation in 2007 (generator change 2015, Medtronic Adapta) returning for follow-up. He remains very physically active. He regularly plays doubles tennis.   The patient specifically denies any chest pain at rest exertion, dyspnea at rest or with exertion, orthopnea, paroxysmal nocturnal dyspnea, syncope, focal neurological deficits, intermittent claudication, lower extremity edema, unexplained weight gain, cough, hemoptysis or wheezing.he is really troubled by palpitations.  He is compliant with anticoagulation with Eliquis and has not had any bleeding problems. He denies any focal neurological events.  Last week he had what sounds like a viral illness. He had generalized arthralgia, lassitude, mild persistent headache and fever to 101F. His blood pressure was erratic during that time. All of the symptoms have now resolved completely.He denies any recent tick bites, rashes, sore throats, vomiting or diarrhea.  Interrogation of his pacemaker shows normal device function with estimated generator longevity of 11 years. There is 92% atrial pacing and only 0.1% ventricular pacing. Since the lengthy episode of atrial fibrillation occurred on May 14, 2016 and lasted for about 20 hours, he has had very little atrial fibrillation. In the last 6 months, the longest episode was only 4 minutes and 22 seconds long. He was asymptomatic and a ventricular rate control was adequate at 92 bpm.he does have occasional episodes of brief paroxysmal atrial tachycardia with 1:1 AV conduction and rates of About 250 bpm.  Normal left ventricular  systolic function by echo most recently performed in 2014. Last nuclear stress test in 2012 was normal. He underwent bypass surgery in 2007 (Dr. Laneta Simmers 2007, LIMA to LAD, SVG to OM, SVG to RCA). His pacemaker is a Medtronic Adapta dual-chamber device implanted in 2015 (leads from 2007). He has treated hyperlipidemia, followed by his primary care physician. He has mild thrombocytopenia without bleeding complications.  Past Medical History:  Diagnosis Date  . Arthritis   . Hyperlipidemia   . Hypertension   . Hypothyroidism   . Paroxysmal atrial fibrillation (HCC) 11/22/2014  . Thrombocytopenia (HCC) 05/23/2015    Past Surgical History:  Procedure Laterality Date  . bilateral carotid duplex ultrasound showed no ICA     stenosis  . CARDIAC CATHETERIZATION     Dr. Jenne Campus which showed a 95% ostial LAD stenosis  . CORONARY ARTERY BYPASS GRAFT     x3 using a left internal mammary artery  . PACEMAKER GENERATOR CHANGE N/A 08/09/2014   Procedure: PACEMAKER GENERATOR CHANGE Donny Pique;  Surgeon: Thurmon Fair, MD;  Location: [facility] CATH LAB;  Service: Cardiovascular;  Laterality: N/A;  . Placement of permanent pacemaker    . US ECHOCARDIOGRAPHY     lft ventricular ejection fraction was about 60%    Current Medications: Outpatient Medications Prior to Visit  Medication Sig Dispense Refill  . ELIQUIS 5 MG TABS tablet TAKE ONE TABLET BY MOUTH TWICE DAILY 60 tablet 11  . metoprolol succinate (TOPROL-XL) 25 MG 24 hr tablet Take 1 tablet (25 mg total) by mouth daily. 30 tablet 11  . rosuvastatin (CRESTOR) 10 MG tablet TAKE ONE TABLET BY MOUTH EVERY OTHER DAY 15 tablet 11  . SYNTHROID 100  MCG tablet Take 100 mg by mouth daily.     No facility-administered medications prior to visit.      Allergies:   Latex and Sulfa antibiotics   Social History   Social History  . Marital status: Married    Spouse name: N/A  . Number of children: N/A  . Years of education: N/A   Social History Main Topics  .  Smoking status: Never Smoker  . Smokeless tobacco: Never Used  . Alcohol use Yes     Comment: occas.  . Drug use: No  . Sexual activity: Not on file   Other Topics Concern  . Not on file   Social History Narrative  . No narrative on file     Family History:  The patient's family history includes Hypertension in his mother; Stroke in his mother.   ROS:   Please see the history of present illness.    ROS All other systems reviewed and are negative.   PHYSICAL EXAM:   VS:  BP 126/66 (BP Location: Left Arm, Patient Position: Sitting, Cuff Size: Normal)   Pulse 76   Ht 5\' 9"  (1.753 m)   Wt 170 lb 6.4 oz (77.3 kg)   BMI 25.16 kg/m    GEN: Well nourished, well developed, in no acute distress   General: Alert, oriented x3, no distress Head: no evidence of trauma, PERRL, EOMI, no exophtalmos or lid lag, no myxedema, no xanthelasma; normal ears, nose and oropharynx Neck: normal jugular venous pulsations and no hepatojugular reflux; brisk carotid pulses without delay and no carotid bruits Chest: clear to auscultation, no signs of consolidation by percussion or palpation, normal fremitus, symmetrical and full respiratory excursions Cardiovascular: normal position and quality of the apical impulse, regular rhythm, normal first and second heart sounds, no murmurs, rubs or gallops. Healthy left subclavian pacemaker site. Abdomen: no tenderness or distention, no masses by palpation, no abnormal pulsatility or arterial bruits, normal bowel sounds, no hepatosplenomegaly Extremities: no clubbing, cyanosis or edema; 2+ radial, ulnar and brachial pulses bilaterally; 2+ right femoral, posterior tibial and dorsalis pedis pulses; 2+ left femoral, posterior tibial and dorsalis pedis pulses; no subclavian or femoral bruits Neurological: grossly nonfocal Psych: euthymic mood, full affect  Wt Readings from Last 3 Encounters:  06/12/17 170 lb 6.4 oz (77.3 kg)  10/01/16 167 lb (75.8 kg)  05/17/16 169 lb  5 oz (76.8 kg)      Studies/Labs Reviewed:   EKG:  EKG is ordered today.  The ekg ordered today demonstrates troponin paced ventricular sensed rhythm without any repolarization abnormalities.  Recent Labs: 01/21/2017: Hemoglobin 15.3; Platelets 138    ASSESSMENT:    1. Paroxysmal atrial fibrillation (HCC)   2. Long term current use of anticoagulant   3. SSS (sick sinus syndrome) (HCC)   4. Pacemaker   5. Coronary artery disease involving native coronary artery of native heart without angina pectoris   6. Essential hypertension   7. Pure hypercholesterolemia      PLAN:  In order of problems listed above:  1. AFib: Infrequent, well rate controlled, on appropriate anticoagulation.CHADSVasc 4 (age 82, HTN, CAD). On anticoagulation, beta blocker. 2. Eliquis: without bleeding complications, compliant. 3. SSS: Heart rate histogram shows appropriate sensor settings. He does not have symptoms of chronotropic incompetence. 4. PPM: Normal device function. Continue remote downloads every 3 months and yearly office visits. 5. CAD: Asymptomatic, 11 years after bypass surgery. Excellent functional status 6. HTN: well treated 7. HLP: monitored by PCP. On highly  active statin.    Medication Adjustments/Labs and Tests Ordered: Current medicines are reviewed at length with the patient today.  Concerns regarding medicines are outlined above.  Medication changes, Labs and Tests ordered today are listed in the Patient Instructions below. Patient Instructions  Dr Royann Shiversroitoru recommends that you continue on your current medications as directed. Please refer to the Current Medication list given to you today.  Remote monitoring is used to monitor your Pacemaker or ICD from home. This monitoring reduces the number of office visits required to check your device to one time per year. It allows us to keep an eye on the functioning of your device to ensure it is working properly. You are scheduled for a device  check from home on Thursday, November 1st, 2018. You may send your transmission at any time that day. If you have a wireless device, the transmission will be sent automatically. After your physician reviews your transmission, you will receive a notification with your next transmission date.  Dr Royann Shiversroitoru recommends that you schedule a follow-up appointment in 12 months with a pacemaker check. You will receive a reminder letter in the mail two months in advance. If you don't receive a letter, please call our office to schedule the follow-up appointment.  If you need a refill on your cardiac medications before your next appointment, please call your pharmacy.    Signed, Thurmon FairMihai Connor Foxworthy, MD  06/12/2017 9:33 AM    [facility] [address], [place], [place]  [address] Phone: [phone](336) [phone]; Fax: [phone](336) [phone]

## 2017-06-12 NOTE — Patient Instructions (Signed)
Dr Royann Shiversroitoru recommends that you continue on your current medications as directed. Please refer to the Current Medication list given to you today.  Remote monitoring is used to monitor your Pacemaker or ICD from home. This monitoring reduces the number of office visits required to check your device to one time per year. It allows us to keep an eye on the functioning of your device to ensure it is working properly. You are scheduled for a device check from home on Thursday, November 1st, 2018. You may send your transmission at any time that day. If you have a wireless device, the transmission will be sent automatically. After your physician reviews your transmission, you will receive a notification with your next transmission date.  Dr Royann Shiversroitoru recommends that you schedule a follow-up appointment in 12 months with a pacemaker check. You will receive a reminder letter in the mail two months in advance. If you don't receive a letter, please call our office to schedule the follow-up appointment.  If you need a refill on your cardiac medications before your next appointment, please call your pharmacy.

## 2017-09-11 ENCOUNTER — Encounter: Payer: Medicare Other | Admitting: *Deleted

## 2017-09-12 ENCOUNTER — Encounter: Payer: Self-pay | Admitting: Cardiology

## 2017-10-06 ENCOUNTER — Other Ambulatory Visit: Payer: Self-pay | Admitting: Cardiovascular Disease

## 2017-10-06 NOTE — Telephone Encounter (Signed)
Rx(s) sent to pharmacy electronically.  

## 2017-10-28 ENCOUNTER — Encounter: Payer: Self-pay | Admitting: Nurse Practitioner

## 2017-11-10 ENCOUNTER — Other Ambulatory Visit: Payer: Self-pay | Admitting: Cardiovascular Disease

## 2017-11-18 ENCOUNTER — Encounter: Payer: Self-pay | Admitting: Internal Medicine

## 2017-12-02 ENCOUNTER — Encounter: Payer: Self-pay | Admitting: Internal Medicine

## 2017-12-09 ENCOUNTER — Other Ambulatory Visit: Payer: Self-pay | Admitting: Internal Medicine

## 2017-12-10 ENCOUNTER — Ambulatory Visit (INDEPENDENT_AMBULATORY_CARE_PROVIDER_SITE_OTHER): Payer: Medicare Other | Admitting: Internal Medicine

## 2017-12-10 ENCOUNTER — Encounter: Payer: Self-pay | Admitting: Internal Medicine

## 2017-12-10 VITALS — BP 140/70 | HR 65 | Resp 16 | Ht 69.0 in | Wt 173.2 lb

## 2017-12-10 DIAGNOSIS — E782 Mixed hyperlipidemia: Secondary | ICD-10-CM

## 2017-12-10 DIAGNOSIS — Z0001 Encounter for general adult medical examination with abnormal findings: Secondary | ICD-10-CM | POA: Diagnosis not present

## 2017-12-10 DIAGNOSIS — Z9189 Other specified personal risk factors, not elsewhere classified: Secondary | ICD-10-CM | POA: Diagnosis not present

## 2017-12-10 DIAGNOSIS — R972 Elevated prostate specific antigen [PSA]: Secondary | ICD-10-CM

## 2017-12-10 DIAGNOSIS — E039 Hypothyroidism, unspecified: Secondary | ICD-10-CM

## 2017-12-10 DIAGNOSIS — I482 Chronic atrial fibrillation, unspecified: Secondary | ICD-10-CM

## 2017-12-10 DIAGNOSIS — R300 Dysuria: Secondary | ICD-10-CM | POA: Diagnosis not present

## 2017-12-10 LAB — CBC WITH DIFFERENTIAL/PLATELET
Basophils Absolute: 0 10*3/uL (ref 0.0–0.2)
Basos: 0 %
EOS (ABSOLUTE): 0.1 10*3/uL (ref 0.0–0.4)
EOS: 2 %
HEMATOCRIT: 43 % (ref 37.5–51.0)
HEMOGLOBIN: 14.6 g/dL (ref 13.0–17.7)
Immature Grans (Abs): 0 10*3/uL (ref 0.0–0.1)
Immature Granulocytes: 0 %
LYMPHS ABS: 1.8 10*3/uL (ref 0.7–3.1)
Lymphs: 24 %
MCH: 31.3 pg (ref 26.6–33.0)
MCHC: 34 g/dL (ref 31.5–35.7)
MCV: 92 fL (ref 79–97)
MONOCYTES: 9 %
Monocytes Absolute: 0.7 10*3/uL (ref 0.1–0.9)
NEUTROS ABS: 4.8 10*3/uL (ref 1.4–7.0)
Neutrophils: 65 %
Platelets: 155 10*3/uL (ref 150–379)
RBC: 4.66 x10E6/uL (ref 4.14–5.80)
RDW: 13.6 % (ref 12.3–15.4)
WBC: 7.4 10*3/uL (ref 3.4–10.8)

## 2017-12-10 LAB — COMPREHENSIVE METABOLIC PANEL
ALBUMIN: 4.3 g/dL (ref 3.5–4.7)
ALK PHOS: 80 IU/L (ref 39–117)
ALT: 16 IU/L (ref 0–44)
AST: 21 IU/L (ref 0–40)
Albumin/Globulin Ratio: 1.7 (ref 1.2–2.2)
BILIRUBIN TOTAL: 0.8 mg/dL (ref 0.0–1.2)
BUN / CREAT RATIO: 13 (ref 10–24)
BUN: 16 mg/dL (ref 8–27)
CHLORIDE: 104 mmol/L (ref 96–106)
CO2: 22 mmol/L (ref 20–29)
CREATININE: 1.22 mg/dL (ref 0.76–1.27)
Calcium: 9.2 mg/dL (ref 8.6–10.2)
GFR calc Af Amer: 63 mL/min/{1.73_m2} (ref >59)
GFR calc non Af Amer: 55 mL/min/{1.73_m2} — ABNORMAL LOW (ref >59)
GLOBULIN, TOTAL: 2.5 g/dL (ref 1.5–4.5)
Glucose: 104 mg/dL — ABNORMAL HIGH (ref 65–99)
Potassium: 5 mmol/L (ref 3.5–5.2)
SODIUM: 145 mmol/L — AB (ref 134–144)
Total Protein: 6.8 g/dL (ref 6.0–8.5)

## 2017-12-10 LAB — LIPID PANEL W/O CHOL/HDL RATIO
CHOLESTEROL TOTAL: 139 mg/dL (ref 100–199)
HDL: 55 mg/dL (ref >39)
LDL CALC: 73 mg/dL (ref 0–99)
Triglycerides: 55 mg/dL (ref 0–149)
VLDL Cholesterol Cal: 11 mg/dL (ref 5–40)

## 2017-12-10 LAB — T4, FREE: Free T4: 1.39 ng/dL (ref 0.82–1.77)

## 2017-12-10 LAB — TSH: TSH: 4.82 u[IU]/mL — ABNORMAL HIGH (ref 0.450–4.500)

## 2017-12-10 LAB — PSA: PROSTATE SPECIFIC AG, SERUM: 12 ng/mL — AB (ref 0.0–4.0)

## 2017-12-10 NOTE — Progress Notes (Signed)
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 Did see his cardiology and has pacemaker  On chronic anticoagulation  Takes all medications as prescribed. Plays tennis regularly, lives wife, has supportive family No memory problems   Current Medication: Outpatient Encounter Medications as of 12/10/2017  Medication Sig Note  . ELIQUIS 5 MG TABS tablet TAKE ONE TABLET BY MOUTH TWICE DAILY   . hydrochlorothiazide (MICROZIDE) 12.5 MG capsule Take by mouth. Take 1/2 tab Qam   . liothyronine (CYTOMEL) 5 MCG tablet Take 5 mcg by mouth daily.   . metoprolol succinate (TOPROL-XL) 25 MG 24 hr tablet Take 1 tablet (25 mg total) by mouth daily.   . rosuvastatin (CRESTOR) 10 MG tablet Take 1 tablet (10 mg total) by mouth every other day.   Marland Kitchen. SYNTHROID 100 MCG tablet Take 100 mg by mouth daily. 09/26/2015: Received from: External Pharmacy Received Sig:    No facility-administered encounter medications on file as of 12/10/2017.     Surgical History: Past Surgical History:  Procedure Laterality Date  . bilateral carotid duplex ultrasound showed no ICA     stenosis  . CARDIAC CATHETERIZATION     Dr. Jenne CampusMcQueen which showed a 95% ostial LAD stenosis  . CORONARY ARTERY BYPASS GRAFT     x3 using a left internal mammary artery  . PACEMAKER GENERATOR CHANGE N/A 08/09/2014   Procedure: PACEMAKER GENERATOR CHANGE Donny Pique/BATTERY;  Surgeon: Thurmon FairMihai Croitoru, MD;  Location: [facility] CATH LAB;  Service: Cardiovascular;  Laterality: N/A;  . Placement of permanent pacemaker    . US ECHOCARDIOGRAPHY     lft ventricular ejection fraction was about 60%    Medical History: Past Medical History:  Diagnosis Date  . Arthritis   . Hyperlipidemia   . Hypertension   . Hypothyroidism    . Paroxysmal atrial fibrillation (HCC) 11/22/2014  . Thrombocytopenia (HCC) 05/23/2015    Family History: Family History  Problem Relation Age of Onset  . Hypertension Mother   . Stroke Mother    Review of Systems  Constitutional: Negative for chills, fatigue and unexpected weight change.  HENT: Positive for postnasal drip. Negative for congestion, rhinorrhea, sneezing and sore throat.   Eyes: Negative for redness.  Respiratory: Negative for cough, chest tightness and shortness of breath.   Cardiovascular: Negative for chest pain and palpitations.  Gastrointestinal: Negative for abdominal pain, constipation, diarrhea, nausea and vomiting.  Genitourinary: Negative for dysuria and frequency.  Musculoskeletal: Negative for arthralgias, back pain, joint swelling and neck pain.  Skin: Negative for rash.  Neurological: Negative.  Negative for tremors and numbness.  Hematological: Negative for adenopathy. Does not bruise/bleed easily.  Psychiatric/Behavioral: Negative for behavioral problems (Depression), sleep disturbance and suicidal ideas. The patient is not nervous/anxious.    Vital Signs: BP 140/70   Pulse 65   Resp 16   Ht 5\' 9"  (1.753 m)   Wt 173 lb 3.2 oz (78.6 kg)   SpO2 98%   BMI 25.58 kg/m    Physical Exam  Constitutional: He is oriented to person, place, and time. He appears well-developed and well-nourished. No distress.  HENT:  Head: Normocephalic and atraumatic.  Right Ear: External ear normal.  Left Ear: External ear normal.  Nose: Nose normal.  Mouth/Throat: Oropharynx is clear and moist.  No oropharyngeal exudate.  Eyes: Conjunctivae and EOM are normal. Pupils are equal, round, and reactive to light. Right eye exhibits no discharge. Left eye exhibits no discharge. No scleral icterus.  Neck: Normal range of motion. Neck supple. No JVD present. No tracheal deviation present. No thyromegaly present.  Cardiovascular: Normal rate, regular rhythm, normal heart sounds  and intact distal pulses. Exam reveals no gallop and no friction rub.  No murmur heard. Pulmonary/Chest: Effort normal and breath sounds normal. No stridor. No respiratory distress. He has no wheezes. He has no rales. He exhibits no tenderness.  Abdominal: Soft. Bowel sounds are normal. He exhibits no distension and no mass. There is no tenderness. There is no rebound and no guarding.  Genitourinary: Vagina normal and uterus normal. No vaginal discharge found.  Musculoskeletal: Normal range of motion. He exhibits no edema, tenderness or deformity.  Lymphadenopathy:    He has no cervical adenopathy.  Neurological: He is alert and oriented to person, place, and time. He has normal reflexes. No cranial nerve deficit. He exhibits normal muscle tone. Coordination normal.  Skin: Skin is warm and dry. No rash noted. He is not diaphoretic. No erythema. No pallor.  Psychiatric: He has a normal mood and affect. His behavior is normal. Judgment and thought content normal.    Assessment/Plan: 1. Encounter for routine adult health examination with abnormal findings - Continue lall meds as before   2. Dysuria - Urinalysis, Routine w reflex microscopic - Microscopic Examination  3. Chronic atrial fibrillation (HCC) - Per cardiology - hydrochlorothiazide (MICROZIDE) 12.5 MG capsule; Take by mouth. Take 1/2 tab Qam - Metoprolol as before.  4. Hypothyroidism, unspecified type - liothyronine (CYTOMEL) 5 MCG tablet; Take 5 mcg by mouth daily. - DG Bone Density; Future  5. Mixed hyperlipidemia - Controlled with meds  6. Elevated PSA, less than 10 ng/ml - Ambulatory referral to Urology  7. High risk for hip fracture - DG Bone Density; Future  General Counseling: keylen eckenrode understanding of the findings of todays visit and agrees with plan of treatment. I have discussed any further diagnostic evaluation that may be needed or ordered today. We also reviewed his medications today. he has been  encouraged to call the office with any questions or concerns that should arise related to todays visit. Cardiac risk factor modification:  control blood pressure., exercise as prescribed., follow low sodium, low fat diet. and low fat and low cholestrol diet.    Orders Placed This Encounter  Procedures  . Urinalysis, Routine w reflex microscopic    Time spent: 17 Minutes  Lyndon Code, MD  Internal Medicine

## 2017-12-11 LAB — URINALYSIS, ROUTINE W REFLEX MICROSCOPIC
Bilirubin, UA: NEGATIVE
GLUCOSE, UA: NEGATIVE
Ketones, UA: NEGATIVE
NITRITE UA: NEGATIVE
Protein, UA: NEGATIVE
RBC, UA: NEGATIVE
Specific Gravity, UA: 1.018 (ref 1.005–1.030)
Urobilinogen, Ur: 0.2 mg/dL (ref 0.2–1.0)
pH, UA: 5.5 (ref 5.0–7.5)

## 2017-12-11 LAB — MICROSCOPIC EXAMINATION: Casts: NONE SEEN /lpf

## 2017-12-15 NOTE — Progress Notes (Signed)
Elevated PSA.[name]. NEEDS TO SEE UROLOGY

## 2017-12-24 ENCOUNTER — Other Ambulatory Visit: Payer: Self-pay

## 2017-12-31 LAB — CUP PACEART INCLINIC DEVICE CHECK
Brady Statistic AP VP Percent: 0 %
Brady Statistic AS VP Percent: 0 %
Brady Statistic AS VS Percent: 8 %
Implantable Lead Implant Date: 20071115
Implantable Lead Implant Date: 20071115
Implantable Lead Location: 753860
Implantable Lead Model: 5076
Lead Channel Impedance Value: 500 Ohm
Lead Channel Impedance Value: 642 Ohm
Lead Channel Pacing Threshold Amplitude: 0.5 V
Lead Channel Pacing Threshold Amplitude: 0.625 V
Lead Channel Pacing Threshold Pulse Width: 0.4 ms
Lead Channel Setting Pacing Amplitude: 2 V
Lead Channel Setting Sensing Sensitivity: 5.6 mV
MDC IDC LEAD LOCATION: 753859
MDC IDC MSMT BATTERY IMPEDANCE: 136 Ohm
MDC IDC MSMT BATTERY REMAINING LONGEVITY: 134 mo
MDC IDC MSMT BATTERY VOLTAGE: 2.79 V
MDC IDC MSMT LEADCHNL RA PACING THRESHOLD PULSEWIDTH: 0.4 ms
MDC IDC PG IMPLANT DT: 20150929
MDC IDC SESS DTM: 20180802120323
MDC IDC SET LEADCHNL RV PACING AMPLITUDE: 2.5 V
MDC IDC SET LEADCHNL RV PACING PULSEWIDTH: 0.4 ms
MDC IDC STAT BRADY AP VS PERCENT: 92 %

## 2018-01-15 ENCOUNTER — Ambulatory Visit: Payer: Medicare Other | Admitting: Urology

## 2018-01-15 ENCOUNTER — Encounter: Payer: Self-pay | Admitting: Urology

## 2018-01-15 VITALS — BP 150/76 | HR 82 | Ht 69.0 in | Wt 170.0 lb

## 2018-01-15 DIAGNOSIS — R972 Elevated prostate specific antigen [PSA]: Secondary | ICD-10-CM | POA: Diagnosis not present

## 2018-01-15 DIAGNOSIS — R351 Nocturia: Secondary | ICD-10-CM | POA: Diagnosis not present

## 2018-01-15 LAB — BLADDER SCAN AMB NON-IMAGING

## 2018-01-15 NOTE — Progress Notes (Signed)
01/15/2018 11:04 AM   [name] 01/02/1933 [id number]  Referring provider: Lyndon Code, MD [address] [place], [place] [address]  Chief Complaint  Patient presents with  . Elevated PSA    New Patient    HPI: 84 yo M referred for further evaluation of elevated PSA up to 12.  Unfortunately, we have no previous PSAs for review today.  He reached out to his primary care's office and awaiting a call back with additional PSA values.  He the patient reports that he had his PSA checked last 2 years ago at which time he was told it was within normal limits.  His PSA was checked again on his routine annual labs and noted to be markedly elevated to 12 on 12/10/17.  He has no day time urinary symptoms including no urgency, frequency, dysuria, gross hematuria, hesitancy and feels like he empties his bladder well. He has have nocturia x 2-4 which is not bothersome to him but is bothersome to his wife.  PVR minimal.    He did present urgent care at [facility] clinic on 01/01/2018 complaining of burning in his bladder.  UA was fairly unremarkable.  Urine culture grew less than 10,000 colonies.  He was treated with antibiotics despite negative culture.  His symptoms resolved spontaneously.  He had no other associated symptoms.  No family history of prostate cancer.  No weight loss or bone pain.     PMH: Past Medical History:  Diagnosis Date  . Arthritis   . Hyperlipidemia   . Hypertension   . Hypothyroidism   . Paroxysmal atrial fibrillation (HCC) 11/22/2014  . Thrombocytopenia (HCC) 05/23/2015    Surgical History: Past Surgical History:  Procedure Laterality Date  . bilateral carotid duplex ultrasound showed no ICA     stenosis  . CARDIAC CATHETERIZATION     Dr. Jenne Campus which showed a 95% ostial LAD stenosis  . CORONARY ARTERY BYPASS GRAFT     x3 using a left internal mammary artery  . PACEMAKER GENERATOR CHANGE N/A 08/09/2014   Procedure: PACEMAKER GENERATOR CHANGE Donny Pique;   Surgeon: Thurmon Fair, MD;  Location: [facility] CATH [facility];  Service: Cardiovascular;  Laterality: N/A;  . Placement of permanent pacemaker    . US ECHOCARDIOGRAPHY     lft ventricular ejection fraction was about 60%    Home Medications:  Allergies as of 01/15/2018      Reactions   Latex Itching   PT STATES THAT AFTER WEARING LATEX GLOVES TO WORK IN THAT HIS HANDS WERE VERY ITCHY.   Sulfa Antibiotics Rash      Medication List        Accurate as of 01/15/18 11:04 AM. Always use your most recent med list.          ELIQUIS 5 MG Tabs tablet Generic drug:  apixaban TAKE ONE TABLET BY MOUTH TWICE DAILY   hydrochlorothiazide 12.5 MG capsule Commonly known as:  MICROZIDE Take by mouth. Take 1/2 tab Qam   liothyronine 5 MCG tablet Commonly known as:  CYTOMEL Take 5 mcg by mouth daily.   metoprolol succinate 25 MG 24 hr tablet Commonly known as:  TOPROL-XL Take 1 tablet (25 mg total) by mouth daily.   rosuvastatin 10 MG tablet Commonly known as:  CRESTOR Take 1 tablet (10 mg total) by mouth every other day.   SYNTHROID 100 MCG tablet Generic drug:  levothyroxine Take 100 mg by mouth daily.       Allergies:  Allergies  Allergen Reactions  .  Latex Itching    PT STATES THAT AFTER WEARING LATEX GLOVES TO WORK IN THAT HIS HANDS WERE VERY ITCHY.  . Sulfa Antibiotics Rash    Family History: Family History  Problem Relation Age of Onset  . Hypertension Mother   . Stroke Mother     Social History:  reports that  has never smoked. he has never used smokeless tobacco. He reports that he drinks alcohol. He reports that he does not use drugs.  ROS: UROLOGY Frequent Urination?: Yes Hard to postpone urination?: Yes Burning/pain with urination?: No Get up at night to urinate?: Yes Leakage of urine?: No Urine stream starts and stops?: No Trouble starting stream?: No Do you have to strain to urinate?: No Blood in urine?: Yes Urinary tract infection?: Yes Sexually transmitted  disease?: No Injury to kidneys or bladder?: No Painful intercourse?: No Weak stream?: No Erection problems?: Yes Penile pain?: No  Gastrointestinal Nausea?: No Vomiting?: No Indigestion/heartburn?: No Diarrhea?: No Constipation?: No  Constitutional Fever: No Night sweats?: No Weight loss?: No Fatigue?: No  Skin Skin rash/lesions?: No Itching?: No  Eyes Blurred vision?: No Double vision?: No  Ears/Nose/Throat Sore throat?: No Sinus problems?: No  Hematologic/Lymphatic Swollen glands?: No Easy bruising?: Yes  Cardiovascular Leg swelling?: No Chest pain?: No  Respiratory Cough?: No Shortness of breath?: No  Endocrine Excessive thirst?: No  Musculoskeletal Back pain?: No Joint pain?: No  Neurological Headaches?: No Dizziness?: No  Psychologic Depression?: No Anxiety?: Yes  Physical Exam: BP (!) 150/76   Pulse 82   Ht 5\' 9"  (1.753 m)   Wt 170 lb (77.1 kg)   BMI 25.10 kg/m   Constitutional:  Alert and oriented, No acute distress.  Accompanied by wife.  Appears slightly younger than stated age. HEENT: [place] AT, moist mucus membranes.  Trachea midline, no masses. Cardiovascular: No clubbing, cyanosis, or edema. Respiratory: Normal respiratory effort, no increased work of breathing. GI: Abdomen is soft, nontender, nondistended, no abdominal masses GU: No CVA tenderness Rectal: Normal sphincter tone.  Enlarged 50 cc prostate, nontender, no nodules. Skin: No rashes, bruises or suspicious lesions. Neurologic: Grossly intact, no focal deficits, moving all 4 extremities. Psychiatric: Normal mood and affect.  Laboratory Data: Lab Results  Component Value Date   WBC 7.4 12/09/2017   HGB 14.6 12/09/2017   HCT 43.0 12/09/2017   MCV 92 12/09/2017   PLT 155 12/09/2017    Lab Results  Component Value Date   CREATININE 1.22 12/09/2017   No PSA as above  Urinalysis UA reviewed today, negative See epic  Pertinent Imaging: Results for orders  placed or performed in visit on 01/15/18  BLADDER SCAN AMB NON-IMAGING  Result Value Ref Range   Scan Result 3ml     Assessment & Plan:    1. Elevated PSA  We reviewed the implications of an elevated PSA and the uncertainty surrounding it. In general, a man's PSA increases with age and is produced by both normal and cancerous prostate tissue. Differential for elevated PSA is BPH, prostate cancer, infection, recent intercourse/ejaculation, prostate infarction, recent urethroscopic manipulation (foley placement/cystoscopy) and prostatitis. Management of an elevated PSA can include observation or prostate biopsy and wediscussed this in detail. We discussed that indications for prostate biopsy are defined by age and race specific PSA cutoffs as well as a PSA velocity of 0.75/year.  Plan to repeat PSA today.  In light of recent acute urinary symptoms, suspect he may have had a prostatitis.  We discussed guidelines for PSA screening.  He is well above the guidelines for following annual PSA.  If this repeat PSA comes down, would advise no further PSA testing.  Natural history of prostate cancer as well as rationale for guidelines were discussed in detail.  All questions were answered.   - PSA - Urinalysis, Complete - BLADDER SCAN AMB NON-IMAGING  2. Nocturia Nocturia x 2-4 without bother Desires no intervention  Will call with repeat PSA and f/u recommendations  Vanna Scotland, MD  [facility] [address] [place], [place] [phone]

## 2018-01-16 ENCOUNTER — Telehealth: Payer: Self-pay

## 2018-01-16 LAB — URINALYSIS, COMPLETE
Bilirubin, UA: NEGATIVE
Glucose, UA: NEGATIVE
Ketones, UA: NEGATIVE
Nitrite, UA: NEGATIVE
PH UA: 5.5 (ref 5.0–7.5)
Protein, UA: NEGATIVE
SPEC GRAV UA: 1.015 (ref 1.005–1.030)
Urobilinogen, Ur: 0.2 mg/dL (ref 0.2–1.0)

## 2018-01-16 LAB — PSA: Prostate Specific Ag, Serum: 3.6 ng/mL (ref 0.0–4.0)

## 2018-01-16 LAB — MICROSCOPIC EXAMINATION
BACTERIA UA: NONE SEEN
EPITHELIAL CELLS (NON RENAL): NONE SEEN /HPF (ref 0–10)
WBC, UA: NONE SEEN /hpf (ref 0–?)

## 2018-01-16 NOTE — Telephone Encounter (Signed)
Letter sent.

## 2018-01-16 NOTE — Telephone Encounter (Signed)
-----   Message from Vanna ScotlandAshley Brandon, MD sent at 01/16/2018  8:27 AM EST ----- Awesome news!  Repeat PSA down to 3.6.  This is completely appropriate for your age.  I would recommend no further PSA testing at this point in time.  No need to follow-up with me unless he develop any urinary issues.  Vanna ScotlandAshley Brandon, MD

## 2018-02-25 DIAGNOSIS — M5417 Radiculopathy, lumbosacral region: Secondary | ICD-10-CM | POA: Insufficient documentation

## 2018-02-25 DIAGNOSIS — M25551 Pain in right hip: Secondary | ICD-10-CM | POA: Insufficient documentation

## 2018-03-25 ENCOUNTER — Other Ambulatory Visit: Payer: Self-pay | Admitting: Unknown Physician Specialty

## 2018-03-25 DIAGNOSIS — G8929 Other chronic pain: Secondary | ICD-10-CM

## 2018-03-25 DIAGNOSIS — M5441 Lumbago with sciatica, right side: Principal | ICD-10-CM

## 2018-03-30 ENCOUNTER — Other Ambulatory Visit: Payer: Self-pay | Admitting: Unknown Physician Specialty

## 2018-03-30 DIAGNOSIS — M545 Low back pain: Principal | ICD-10-CM

## 2018-03-30 DIAGNOSIS — G8929 Other chronic pain: Secondary | ICD-10-CM

## 2018-04-01 ENCOUNTER — Other Ambulatory Visit: Payer: Self-pay | Admitting: Cardiovascular Disease

## 2018-04-08 ENCOUNTER — Ambulatory Visit
Admission: RE | Admit: 2018-04-08 | Discharge: 2018-04-08 | Disposition: A | Discharge status: Home / Self Care | Payer: Medicare Other | Source: Ambulatory Visit | Attending: Unknown Physician Specialty | Admitting: Unknown Physician Specialty

## 2018-04-08 DIAGNOSIS — M48061 Spinal stenosis, lumbar region without neurogenic claudication: Secondary | ICD-10-CM | POA: Insufficient documentation

## 2018-04-08 DIAGNOSIS — M488X7 Other specified spondylopathies, lumbosacral region: Secondary | ICD-10-CM | POA: Diagnosis not present

## 2018-04-08 DIAGNOSIS — G8929 Other chronic pain: Secondary | ICD-10-CM | POA: Insufficient documentation

## 2018-04-08 DIAGNOSIS — M545 Low back pain: Secondary | ICD-10-CM | POA: Diagnosis present

## 2018-04-14 DIAGNOSIS — M5416 Radiculopathy, lumbar region: Secondary | ICD-10-CM | POA: Insufficient documentation

## 2018-05-01 ENCOUNTER — Other Ambulatory Visit: Payer: Self-pay | Admitting: Cardiovascular Disease

## 2018-06-09 ENCOUNTER — Ambulatory Visit: Payer: Self-pay | Admitting: Nurse Practitioner

## 2018-06-26 ENCOUNTER — Ambulatory Visit: Payer: Self-pay | Admitting: Nurse Practitioner

## 2018-07-08 ENCOUNTER — Encounter: Payer: Medicare Other | Admitting: Cardiovascular Disease

## 2018-07-16 ENCOUNTER — Ambulatory Visit: Payer: Medicare Other | Admitting: Cardiovascular Disease

## 2018-07-16 ENCOUNTER — Encounter: Payer: Self-pay | Admitting: Cardiovascular Disease

## 2018-07-16 VITALS — BP 110/68 | HR 69 | Ht 69.0 in | Wt 167.0 lb

## 2018-07-16 DIAGNOSIS — I495 Sick sinus syndrome: Secondary | ICD-10-CM | POA: Diagnosis not present

## 2018-07-16 DIAGNOSIS — I25810 Atherosclerosis of coronary artery bypass graft(s) without angina pectoris: Secondary | ICD-10-CM | POA: Diagnosis not present

## 2018-07-16 DIAGNOSIS — Z7901 Long term (current) use of anticoagulants: Secondary | ICD-10-CM

## 2018-07-16 DIAGNOSIS — I480 Paroxysmal atrial fibrillation: Secondary | ICD-10-CM | POA: Diagnosis not present

## 2018-07-16 DIAGNOSIS — Z950 Presence of cardiac pacemaker: Secondary | ICD-10-CM

## 2018-07-16 DIAGNOSIS — E7800 Pure hypercholesterolemia, unspecified: Secondary | ICD-10-CM

## 2018-07-16 DIAGNOSIS — I10 Essential (primary) hypertension: Secondary | ICD-10-CM

## 2018-07-16 NOTE — Progress Notes (Signed)
Cardiology Office Note    Date:  07/16/2018   ID:  [name], DOB 19-Sep-1933, MRN [id number]  PCP:  Raynelle Bring  Cardiologist:   Thurmon Fair, MD   Chief Complaint  Patient presents with  . Follow-up    pt denied chest pain and SOB    History of Present Illness:  [name] is a 84 y.o. male with CAD s/p CABG, sinus node dysfunction and paroxysmal atrial fibrillation, status post dual-chamber permanent pacemaker implantation in 2007 (generator change 2015, Medtronic Adapta) returning for follow-up.   He had agreed here without any health challenges.  He remains very physically active. He regularly plays doubles tennis.  He has noticed that he is a little less tolerant of the heat compared to a year ago.  The patient specifically denies any chest pain at rest exertion, dyspnea at rest or with exertion, orthopnea, paroxysmal nocturnal dyspnea, syncope, palpitations, focal neurological deficits, intermittent claudication, lower extremity edema, unexplained weight gain, cough, hemoptysis or wheezing.  He is compliant with anticoagulation and has not had any bleeding problems.  Interrogation of his pacemaker shows normal device function with estimated generator longevity of 10 years. There is 91.5% atrial pacing and <0.3% ventricular pacing.  The burden of atrial fibrillation is only 0.4%.  His most recent event lasted for about 3 hours, had an average ventricular rate of 85 bpm and happened on August 24.  He is very rarely aware of any palpitations.  He has occasional episodes of very brief paroxysmal atrial tachycardia with 1:1 AV conduction and faster rates.  He has not had any true ventricular tachycardia.  Normal left ventricular systolic function by echo most recently performed in 2014. Last nuclear stress test in 2012 was normal. He underwent bypass surgery in 2007 (Dr. Laneta Simmers 2007, LIMA to LAD, SVG to OM, SVG to RCA). His pacemaker is a Medtronic Adapta dual-chamber device  implanted in 2015 (leads from 2007). He has treated hyperlipidemia, followed by his primary care physician. He has mild thrombocytopenia without bleeding complications.  Past Medical History:  Diagnosis Date  . Arthritis   . Hyperlipidemia   . Hypertension   . Hypothyroidism   . Paroxysmal atrial fibrillation (HCC) 11/22/2014  . Thrombocytopenia (HCC) 05/23/2015    Past Surgical History:  Procedure Laterality Date  . bilateral carotid duplex ultrasound showed no ICA     stenosis  . CARDIAC CATHETERIZATION     Dr. Jenne Campus which showed a 95% ostial LAD stenosis  . CORONARY ARTERY BYPASS GRAFT     x3 using a left internal mammary artery  . PACEMAKER GENERATOR CHANGE N/A 08/09/2014   Procedure: PACEMAKER GENERATOR CHANGE Donny Pique;  Surgeon: Thurmon Fair, MD;  Location: [facility]ATH [facility];  Service: Cardiovascular;  Laterality: N/A;  . Placement of permanent pacemaker    . US ECHOCARDIOGRAPHY     lft ventricular ejection fraction was about 60%    Current Medications: Outpatient Medications Prior to Visit  Medication Sig Dispense Refill  . ELIQUIS 5 MG TABS tablet TAKE ONE TABLET BY MOUTH TWICE DAILY 180 tablet 0  . liothyronine (CYTOMEL) 5 MCG tablet Take 5 mcg by mouth daily.    . metoprolol succinate (TOPROL-XL) 25 MG 24 hr tablet TAKE ONE TABLET BY MOUTH EVERY DAY 90 tablet 1  . rosuvastatin (CRESTOR) 10 MG tablet TAKE ONE TABLET BY MOUTH EVERY OTHER DAY 45 tablet 1  . SYNTHROID 100 MCG tablet Take 100 mg by mouth daily.    . hydrochlorothiazide (MICROZIDE)  12.5 MG capsule Take by mouth. Take 1/2 tab Qam     No facility-administered medications prior to visit.      Allergies:   Latex and Sulfa antibiotics   Social History   Socioeconomic History  . Marital status: Married    Spouse name: Not on file  . Number of children: Not on file  . Years of education: Not on file  . Highest education level: Not on file  Occupational History  . Not on file  Social Needs  . Financial  resource strain: Not on file  . Food insecurity:    Worry: Not on file    Inability: Not on file  . Transportation needs:    Medical: Not on file    Non-medical: Not on file  Tobacco Use  . Smoking status: Never Smoker  . Smokeless tobacco: Never Used  Substance and Sexual Activity  . Alcohol use: Yes    Comment: occas.  . Drug use: No  . Sexual activity: Not on file  Lifestyle  . Physical activity:    Days per week: Not on file    Minutes per session: Not on file  . Stress: Not on file  Relationships  . Social connections:    Talks on phone: Not on file    Gets together: Not on file    Attends religious service: Not on file    Active member of club or organization: Not on file    Attends meetings of clubs or organizations: Not on file    Relationship status: Not on file  Other Topics Concern  . Not on file  Social History Narrative  . Not on file     Family History:  The patient's family history includes Hypertension in his mother; Stroke in his mother.   ROS:   Please see the history of present illness.    ROS all other systems are reviewed and are negative  PHYSICAL EXAM:   VS:  BP 110/68   Pulse 69   Ht 5\' 9"  (1.753 m)   Wt 167 lb (75.8 kg)   BMI 24.66 kg/m     General: Alert, oriented x3, no distress, He looks quite fit and appears younger than his stated age Head: no evidence of trauma, PERRL, EOMI, no exophtalmos or lid lag, no myxedema, no xanthelasma; normal ears, nose and oropharynx Neck: normal jugular venous pulsations and no hepatojugular reflux; brisk carotid pulses without delay and no carotid bruits Chest: clear to auscultation, no signs of consolidation by percussion or palpation, normal fremitus, symmetrical and full respiratory excursions Cardiovascular: normal position and quality of the apical impulse, regular rhythm, normal first and second heart sounds, no murmurs, rubs or gallops Abdomen: no tenderness or distention, no masses by palpation,  no abnormal pulsatility or arterial bruits, normal bowel sounds, no hepatosplenomegaly Extremities: no clubbing, cyanosis or edema; 2+ radial, ulnar and brachial pulses bilaterally; 2+ right femoral, posterior tibial and dorsalis pedis pulses; 2+ left femoral, posterior tibial and dorsalis pedis pulses; no subclavian or femoral bruits Neurological: grossly nonfocal Psych: Normal mood and affect   Wt Readings from Last 3 Encounters:  07/16/18 167 lb (75.8 kg)  01/15/18 170 lb (77.1 kg)  12/10/17 173 lb 3.2 oz (78.6 kg)      Studies/Labs Reviewed:   EKG:  EKG is ordered today.  She was atrial paced, ventricular sensed rhythm with a relatively long AV delay of 240 ms, no repolarization abnormalities, QTC 470 ms  Recent Labs: 12/09/2017: ALT  16; BUN 16; Creatinine, Ser 1.22; Hemoglobin 14.6; Platelets 155; Potassium 5.0; Sodium 145; TSH 4.820   ASSESSMENT:    1. Paroxysmal atrial fibrillation (HCC)   2. Long term current use of anticoagulant   3. SSS (sick sinus syndrome) (HCC)   4. Pacemaker   5. Coronary artery disease involving coronary bypass graft of native heart without angina pectoris   6. Essential hypertension   7. Hypercholesterolemia      PLAN:  In order of problems listed above:  1. AFib: The overall burden of arrhythmia is low and he is appropriately anticoagulated.  The arrhythmia is largely asymptomatic.  Ventricular rate control is acceptable.  On appropriate anticoagulation.CHADSVasc 4 (age 39, HTN, CAD).  2. Eliquis: Compliant with medication, no serious bleeding problems 3. SSS: He has nearly 100% atrial pacing, but the heart rate histogram distribution is excellent and he has no symptoms of chronotropic incompetence. 4. PPM: Normal device function. Continue remote downloads every 3 months and yearly office visits.  On the average we have only been getting remote downloads every 6 months.  We will try to stick to a stricter program. 5. CAD: Asymptomatic, excellent  functional status.  About 12 years have passed since his bypass. 6. HTN: Excellent control 7. HLP: Last LDL was 73, very close to target. On highly active statin.    Medication Adjustments/Labs and Tests Ordered: Current medicines are reviewed at length with the patient today.  Concerns regarding medicines are outlined above.  Medication changes, Labs and Tests ordered today are listed in the Patient Instructions below. Patient Instructions  Dr Royann Shivers recommends that you continue on your current medications as directed. Please refer to the Current Medication list given to you today.  Remote monitoring is used to monitor your Pacemaker or ICD from home. This monitoring reduces the number of office visits required to check your device to one time per year. It allows [place] to keep an eye on the functioning of your device to ensure it is working properly. You are scheduled for a device check from home on Thursday, December 5th, 2019. You may send your transmission at any time that day. If you have a wireless device, the transmission will be sent automatically. After your physician reviews your transmission, you will receive a notification with your next transmission date.  To improve our patient care and to more adequately follow your device, [facility] has decided, as a practice, to start following each patient four times a year with your home monitor. This means that you may experience a remote appointment that is close to an in-office appointment with your physician. Your insurance will apply at the same rate as other remote monitoring transmissions.  Dr Royann Shivers recommends that you schedule a follow-up appointment in 12 months with a pacemaker check. You will receive a reminder letter in the mail two months in advance. If you don't receive a letter, please call our office to schedule the follow-up appointment.  If you need a refill on your cardiac medications before your next appointment, please  call your pharmacy.    Signed, Thurmon Fair, MD  07/16/2018 2:48 PM    [facility] [address] [place], [place], [place]  [address] Phone: [phone]; Fax: [phone]

## 2018-07-16 NOTE — Patient Instructions (Signed)
Dr Royann Shivers recommends that you continue on your current medications as directed. Please refer to the Current Medication list given to you today.  Remote monitoring is used to monitor your Pacemaker or ICD from home. This monitoring reduces the number of office visits required to check your device to one time per year. It allows [place] to keep an eye on the functioning of your device to ensure it is working properly. You are scheduled for a device check from home on Thursday, December 5th, 2019. You may send your transmission at any time that day. If you have a wireless device, the transmission will be sent automatically. After your physician reviews your transmission, you will receive a notification with your next transmission date.  To improve our patient care and to more adequately follow your device, [facility] has decided, as a practice, to start following each patient four times a year with your home monitor. This means that you may experience a remote appointment that is close to an in-office appointment with your physician. Your insurance will apply at the same rate as other remote monitoring transmissions.  Dr Royann Shivers recommends that you schedule a follow-up appointment in 12 months with a pacemaker check. You will receive a reminder letter in the mail two months in advance. If you don't receive a letter, please call our office to schedule the follow-up appointment.  If you need a refill on your cardiac medications before your next appointment, please call your pharmacy.

## 2018-07-31 ENCOUNTER — Other Ambulatory Visit: Payer: Self-pay | Admitting: Cardiovascular Disease

## 2018-09-30 ENCOUNTER — Other Ambulatory Visit: Payer: Self-pay | Admitting: Cardiovascular Disease

## 2018-10-15 ENCOUNTER — Telehealth: Payer: Self-pay

## 2018-10-15 NOTE — Telephone Encounter (Signed)
Attempted to confirm remote transmission with pt. No answer and was unable to leave a message.   

## 2018-10-19 ENCOUNTER — Ambulatory Visit (INDEPENDENT_AMBULATORY_CARE_PROVIDER_SITE_OTHER): Payer: Medicare Other

## 2018-10-19 DIAGNOSIS — I495 Sick sinus syndrome: Secondary | ICD-10-CM

## 2018-10-19 DIAGNOSIS — I442 Atrioventricular block, complete: Secondary | ICD-10-CM

## 2018-10-20 NOTE — Progress Notes (Signed)
Remote pacemaker transmission.   

## 2018-10-23 DIAGNOSIS — R972 Elevated prostate specific antigen [PSA]: Secondary | ICD-10-CM | POA: Insufficient documentation

## 2018-12-02 LAB — CUP PACEART REMOTE DEVICE CHECK
Battery Impedance: 232 Ohm
Battery Voltage: 2.79 V
Brady Statistic AP VS Percent: 91 %
Brady Statistic AS VP Percent: 0 %
Brady Statistic AS VS Percent: 9 %
Date Time Interrogation Session: 20191209160226
Implantable Lead Implant Date: 20071115
Implantable Lead Location: 753859
Implantable Lead Location: 753860
Implantable Lead Model: 4092
Implantable Lead Model: 5076
Lead Channel Impedance Value: 603 Ohm
Lead Channel Pacing Threshold Amplitude: 0.625 V
Lead Channel Pacing Threshold Pulse Width: 0.4 ms
Lead Channel Setting Pacing Amplitude: 2.5 V
Lead Channel Setting Pacing Pulse Width: 0.4 ms
MDC IDC LEAD IMPLANT DT: 20071115
MDC IDC MSMT BATTERY REMAINING LONGEVITY: 116 mo
MDC IDC MSMT LEADCHNL RA IMPEDANCE VALUE: 500 Ohm
MDC IDC MSMT LEADCHNL RV PACING THRESHOLD AMPLITUDE: 0.5 V
MDC IDC MSMT LEADCHNL RV PACING THRESHOLD PULSEWIDTH: 0.4 ms
MDC IDC PG IMPLANT DT: 20150929
MDC IDC SET LEADCHNL RA PACING AMPLITUDE: 2 V
MDC IDC SET LEADCHNL RV SENSING SENSITIVITY: 5.6 mV
MDC IDC STAT BRADY AP VP PERCENT: 0 %

## 2018-12-15 LAB — CUP PACEART INCLINIC DEVICE CHECK
Implantable Lead Implant Date: 20071115
Implantable Lead Implant Date: 20071115
Implantable Lead Location: 753859
Implantable Lead Location: 753860
Implantable Lead Model: 4092
Implantable Lead Model: 5076
Implantable Pulse Generator Implant Date: 20150929
MDC IDC SESS DTM: 20200204140041

## 2019-01-14 IMAGING — CT CT L SPINE W/O CM
3 of 5 series · 13 of 33 positions shown, 16 images · non-contrast
Comparison: None.

CLINICAL DATA: Chronic low back pain, right leg pain

EXAM:
CT LUMBAR SPINE WITHOUT CONTRAST
TECHNIQUE: Multidetector CT imaging of the lumbar spine was performed without
intravenous contrast administration. Multiplanar CT image
reconstructions were also generated.

[Series 2: lspine st l-spine · axial · 0.34mm/px · z∈[-1519,-1359]mm · 5 of 122 slices shown, 7 images]
[im 21/122  soft-tissue]
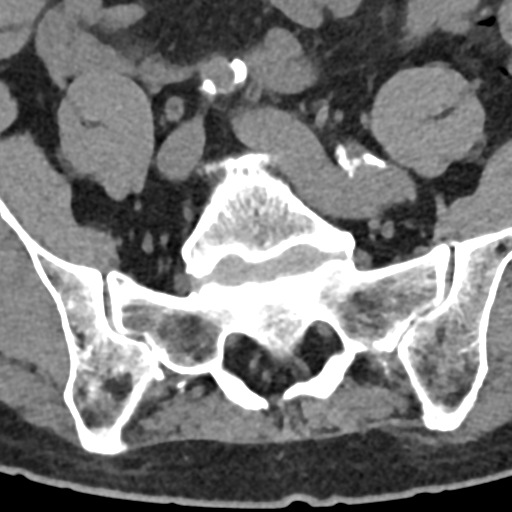
[im 21/122  bone]
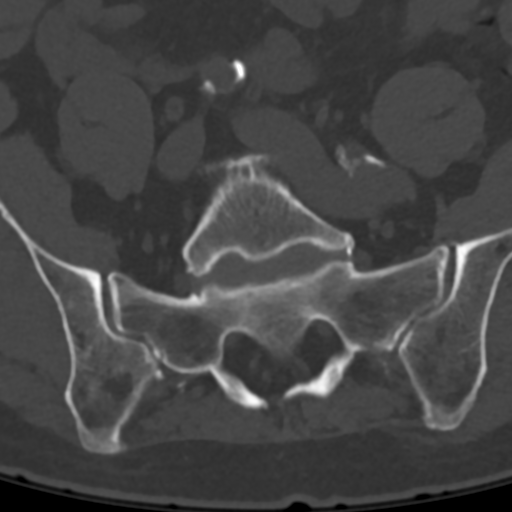
[im 41/122  bone]
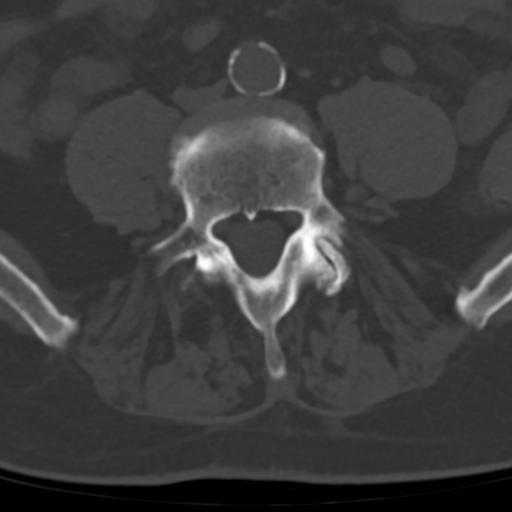
[im 61/122  bone]
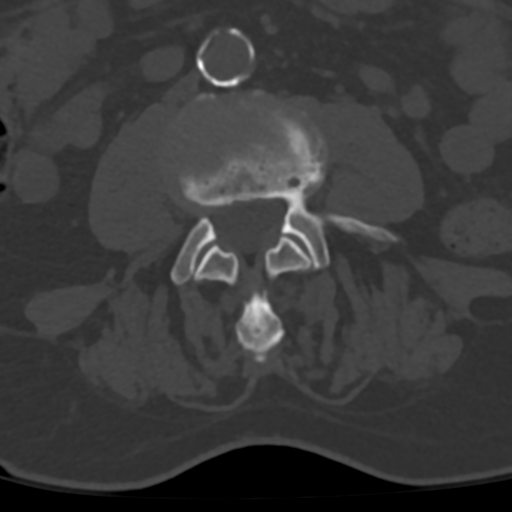
[im 81/122  bone]
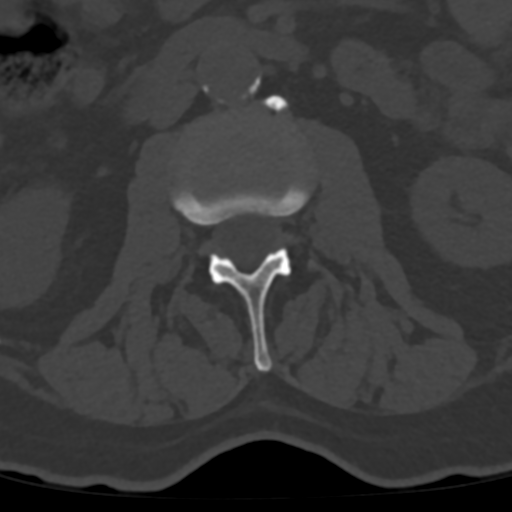
[im 101/122  soft-tissue]
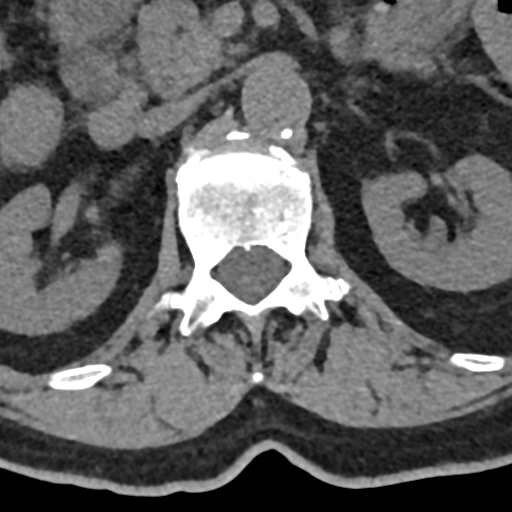
[im 101/122  bone]
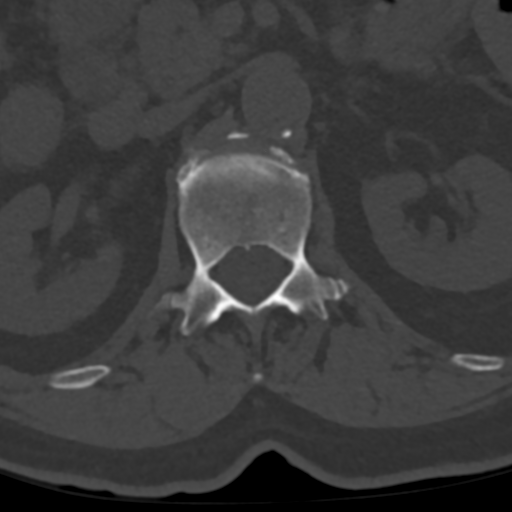

[Series 4: sag bone l-spine · sagittal · 0.34mm/px · 5 of 81 slices shown, 6 images]
[im 27/81  bone]
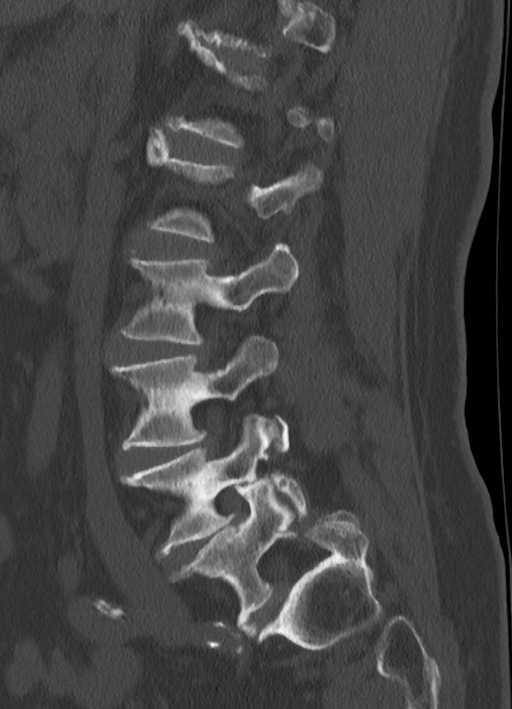
[im 34/81  bone]
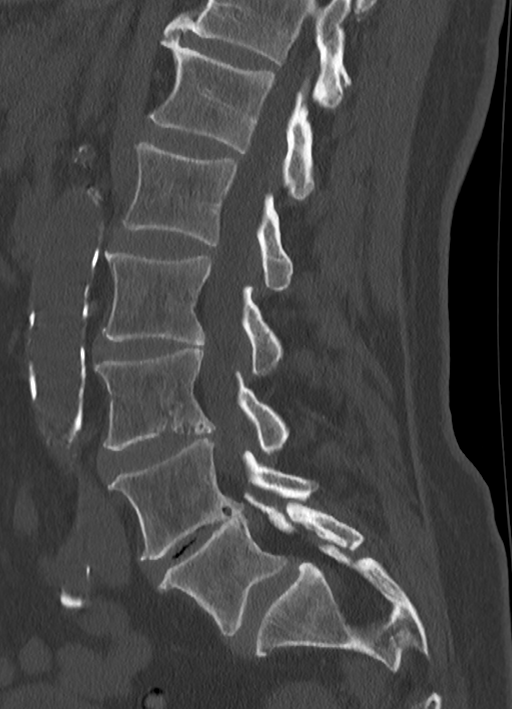
[im 41/81  soft-tissue]
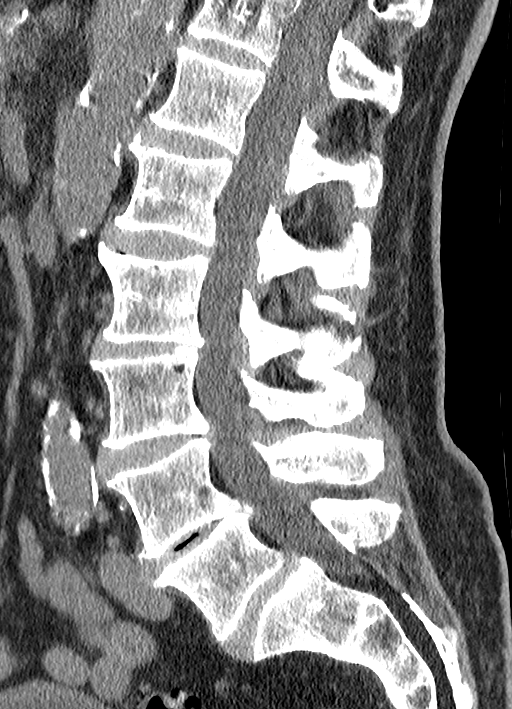
[im 41/81  bone]
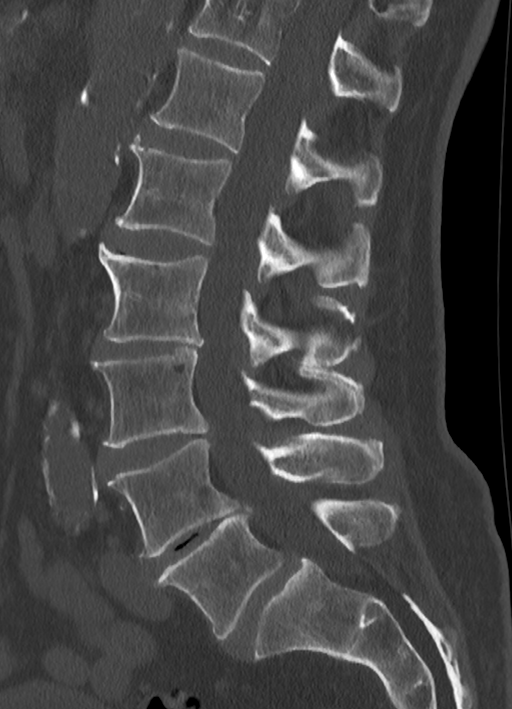
[im 47/81  bone]
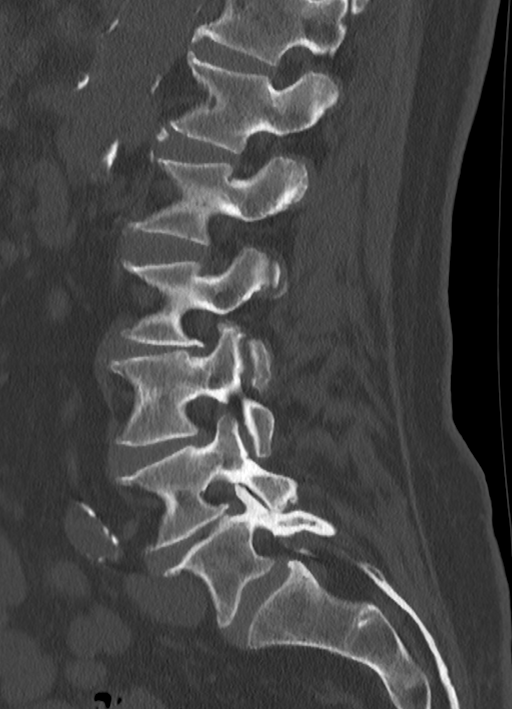
[im 54/81  bone]
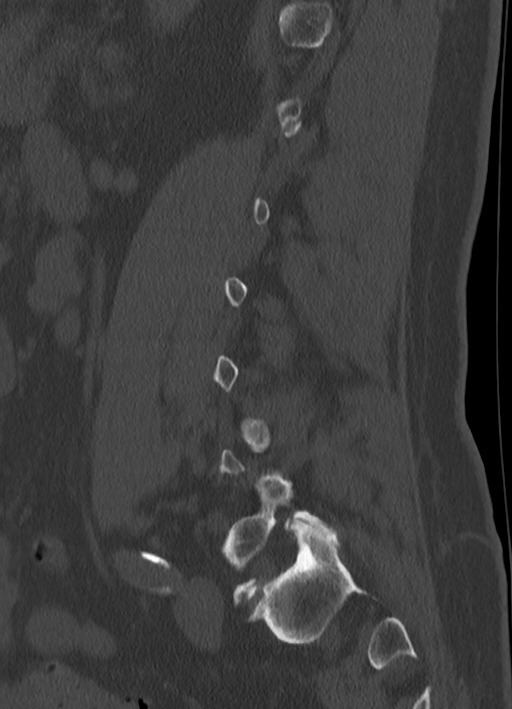

[Series 6: cor bone l-spine · coronal · 0.34mm/px · 3 of 80 slices shown]
[im 20/80  bone]
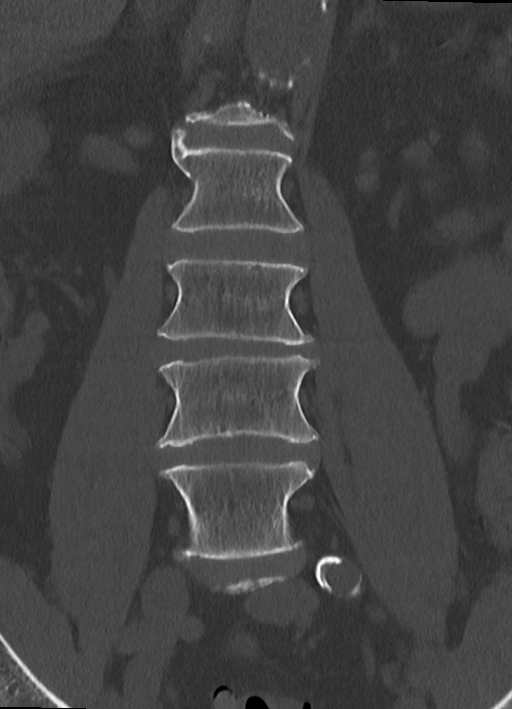
[im 40/80  bone]
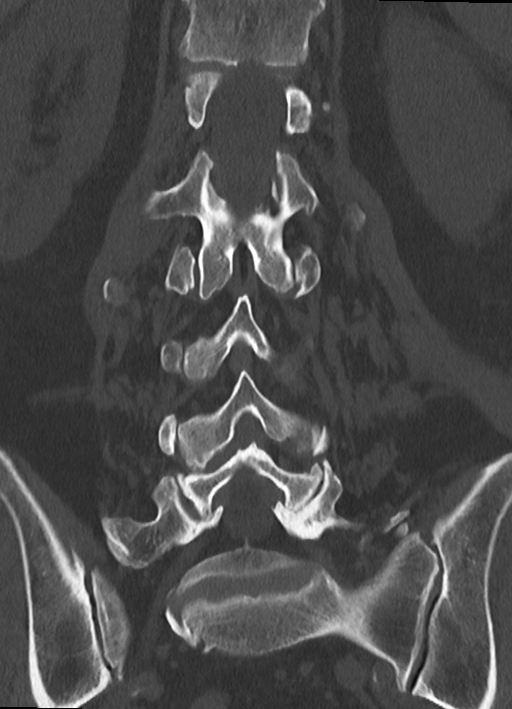
[im 60/80  bone]
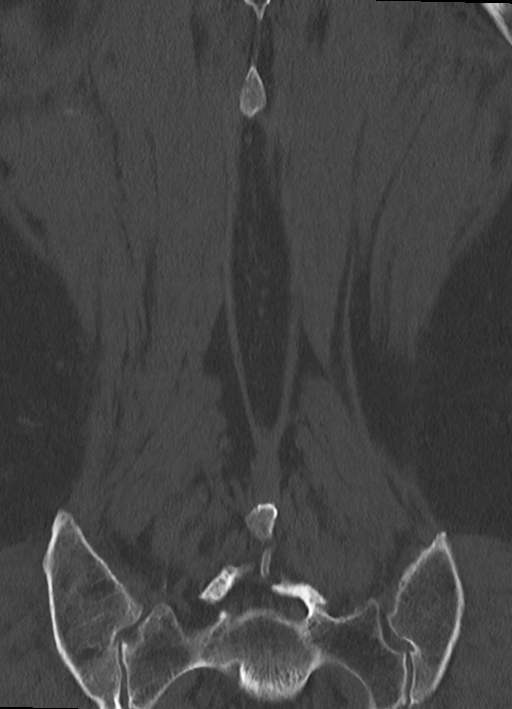

[13 of 33 positions shown; findings below may reference images not displayed]

FINDINGS: Segmentation: Normal segmentation. Small ribs at T12 with 5
non-rib-bearing lumbar segments. Lowest disc space L5-S1

Alignment: Normal

Vertebrae: Negative for fracture. Chronic unilateral pars defect on
the right L5.

Paraspinal and other soft tissues: Atherosclerotic aorta. No
retroperitoneal mass.

Disc levels: T12-L1: Negative

L1-2: Mild disc and facet degeneration

L2-3: Disc bulging and mild spurring.  Negative for stenosis.

L3-4: Disc bulging and spurring. Mild facet degeneration. Mild
spinal stenosis. Neural foramina patent.

L4-5: Disc degeneration and spondylosis. Bilateral facet
degeneration. Mild subarticular and foraminal stenosis bilaterally.

L5-S1: Chronic unilateral pars defect on the right. Negative for
spinal or foraminal stenosis.
IMPRESSION: Mild spinal stenosis L3-4

Mild subarticular stenosis and foraminal stenosis bilaterally L4-5

Chronic unilateral pars defect on the right at L5-S1. No significant
slip or foraminal stenosis.

## 2019-01-18 ENCOUNTER — Encounter: Payer: Medicare Other | Admitting: *Deleted

## 2019-01-19 ENCOUNTER — Telehealth: Payer: Self-pay

## 2019-01-19 NOTE — Telephone Encounter (Signed)
Unable to leave a message for patient to remind of missed remote transmission.  

## 2019-01-29 ENCOUNTER — Encounter: Payer: Self-pay | Admitting: Cardiology

## 2019-02-01 ENCOUNTER — Telehealth: Payer: Self-pay | Admitting: Cardiovascular Disease

## 2019-02-01 NOTE — Telephone Encounter (Signed)
Patient is calling for holter monitor results.

## 2019-02-01 NOTE — Telephone Encounter (Signed)
Returned the pt call. Unable to lmom pt phone rings out. Pt has not had any test order recently or a holter. Review of the pt chart show that he is due for a remote device transmission. Will fwd the message to device clinic to f/u.

## 2019-02-02 NOTE — Telephone Encounter (Signed)
LMOVM for pt to return call. Patient has a Patent examiner.

## 2019-02-04 NOTE — Telephone Encounter (Signed)
I did attempt to call the patient however the patient is not accepting calls from unknown callers.

## 2019-02-05 ENCOUNTER — Ambulatory Visit (INDEPENDENT_AMBULATORY_CARE_PROVIDER_SITE_OTHER): Payer: Medicare Other | Admitting: *Deleted

## 2019-02-05 ENCOUNTER — Other Ambulatory Visit: Payer: Self-pay

## 2019-02-05 DIAGNOSIS — I495 Sick sinus syndrome: Secondary | ICD-10-CM

## 2019-02-05 NOTE — Telephone Encounter (Signed)
2nd attempt  Called pt home number and left a message for him to call back. Attempted to call mobile number no answer and unable to leave a message b/c mailbox has not been set up.

## 2019-02-05 NOTE — Telephone Encounter (Signed)
Spoke w/ pt and informed him that his remote transmission was received. Pt verbalized understanding.  

## 2019-02-05 NOTE — Telephone Encounter (Signed)
Spoke w/ pt and he wanted to know if he could send a remote transmission today b/c he missed the remote appt on 01/18/2019. I informed pt that he could do that. He also wanted to know if he could call back to make sure we get the transmission. I informed him that he can call back and we will let him know. Pt verbalized understanding.

## 2019-02-06 LAB — CUP PACEART REMOTE DEVICE CHECK
Brady Statistic AP VS Percent: 90 %
Brady Statistic AS VS Percent: 9 %
Date Time Interrogation Session: 20200327153629
Implantable Lead Implant Date: 20071115
Implantable Lead Location: 753859
Implantable Lead Location: 753860
Implantable Pulse Generator Implant Date: 20150929
Lead Channel Impedance Value: 508 Ohm
Lead Channel Impedance Value: 602 Ohm
Lead Channel Pacing Threshold Amplitude: 0.5 V
Lead Channel Pacing Threshold Pulse Width: 0.4 ms
Lead Channel Pacing Threshold Pulse Width: 0.4 ms
Lead Channel Setting Pacing Amplitude: 2.5 V
Lead Channel Setting Sensing Sensitivity: 5.6 mV
MDC IDC LEAD IMPLANT DT: 20071115
MDC IDC MSMT BATTERY IMPEDANCE: 256 Ohm
MDC IDC MSMT BATTERY REMAINING LONGEVITY: 112 mo
MDC IDC MSMT BATTERY VOLTAGE: 2.79 V
MDC IDC MSMT LEADCHNL RA PACING THRESHOLD AMPLITUDE: 0.5 V
MDC IDC SET LEADCHNL RA PACING AMPLITUDE: 2 V
MDC IDC SET LEADCHNL RV PACING PULSEWIDTH: 0.4 ms
MDC IDC STAT BRADY AP VP PERCENT: 0 %
MDC IDC STAT BRADY AS VP PERCENT: 0 %

## 2019-02-08 ENCOUNTER — Encounter: Payer: Self-pay | Admitting: Cardiology

## 2019-02-08 NOTE — Progress Notes (Signed)
Remote pacemaker transmission.   

## 2019-02-15 ENCOUNTER — Telehealth: Payer: Self-pay

## 2019-02-15 NOTE — Telephone Encounter (Signed)
Error

## 2019-03-02 ENCOUNTER — Other Ambulatory Visit: Payer: Self-pay | Admitting: Cardiovascular Disease

## 2019-03-02 NOTE — Telephone Encounter (Signed)
85yo, 75.8kg, Scr  1.1 on 10/06/18 in care everywhere Last OV 07/16/18 Indication afib

## 2019-03-31 ENCOUNTER — Other Ambulatory Visit: Payer: Self-pay | Admitting: Cardiovascular Disease

## 2019-05-07 ENCOUNTER — Telehealth: Payer: Self-pay

## 2019-05-07 ENCOUNTER — Encounter: Payer: Medicare Other | Admitting: *Deleted

## 2019-05-07 NOTE — Telephone Encounter (Signed)
Left message for patient to remind of missed remote transmission.  

## 2019-05-13 ENCOUNTER — Encounter: Payer: Self-pay | Admitting: Cardiology

## 2019-05-24 ENCOUNTER — Ambulatory Visit (INDEPENDENT_AMBULATORY_CARE_PROVIDER_SITE_OTHER): Payer: Medicare Other | Admitting: *Deleted

## 2019-05-24 DIAGNOSIS — I471 Supraventricular tachycardia, unspecified: Secondary | ICD-10-CM

## 2019-05-24 DIAGNOSIS — I495 Sick sinus syndrome: Secondary | ICD-10-CM

## 2019-05-24 LAB — CUP PACEART REMOTE DEVICE CHECK
Battery Impedance: 232 Ohm
Battery Remaining Longevity: 115 mo
Battery Voltage: 2.79 V
Brady Statistic AP VP Percent: 0 %
Brady Statistic AP VS Percent: 90 %
Brady Statistic AS VP Percent: 0 %
Brady Statistic AS VS Percent: 10 %
Date Time Interrogation Session: 20200713135346
Implantable Lead Implant Date: 20071115
Implantable Lead Implant Date: 20071115
Implantable Lead Location: 753859
Implantable Lead Location: 753860
Implantable Lead Model: 4092
Implantable Lead Model: 5076
Implantable Pulse Generator Implant Date: 20150929
Lead Channel Impedance Value: 493 Ohm
Lead Channel Impedance Value: 605 Ohm
Lead Channel Pacing Threshold Amplitude: 0.5 V
Lead Channel Pacing Threshold Amplitude: 0.5 V
Lead Channel Pacing Threshold Pulse Width: 0.4 ms
Lead Channel Pacing Threshold Pulse Width: 0.4 ms
Lead Channel Setting Pacing Amplitude: 2 V
Lead Channel Setting Pacing Amplitude: 2.5 V
Lead Channel Setting Pacing Pulse Width: 0.4 ms
Lead Channel Setting Sensing Sensitivity: 5.6 mV

## 2019-06-04 NOTE — Progress Notes (Signed)
Remote pacemaker transmission.   

## 2019-07-14 ENCOUNTER — Telehealth: Payer: Self-pay | Admitting: *Deleted

## 2019-07-14 NOTE — Telephone Encounter (Signed)
Pacemaker shows normal rhythm at the time of the download and no significant rhythm abnormalities since May.  Left message on his home voicemail.

## 2019-07-14 NOTE — Telephone Encounter (Signed)
Call placed to the patient. He stated that he feels like he is in atrial fibrillation. He denies shortness of breath and chest pain, stating that his only symptom is tiredness. Heart rate this morning was 77.  He is currently taking Metoprolol Succinate 25 mg once daily and Eliquis 5 mg bid.  He has been asked to due a remote download.

## 2019-07-16 NOTE — Telephone Encounter (Signed)
Left a message for the patient to call back if anything further was needed. °

## 2019-07-22 ENCOUNTER — Other Ambulatory Visit: Payer: Self-pay

## 2019-07-22 ENCOUNTER — Encounter: Payer: Self-pay | Admitting: Cardiovascular Disease

## 2019-07-22 ENCOUNTER — Ambulatory Visit (INDEPENDENT_AMBULATORY_CARE_PROVIDER_SITE_OTHER): Payer: Medicare Other | Admitting: Cardiovascular Disease

## 2019-07-22 VITALS — BP 141/79 | HR 83 | Temp 97.1°F | Ht 68.0 in | Wt 163.2 lb

## 2019-07-22 DIAGNOSIS — I480 Paroxysmal atrial fibrillation: Secondary | ICD-10-CM | POA: Diagnosis not present

## 2019-07-22 DIAGNOSIS — I10 Essential (primary) hypertension: Secondary | ICD-10-CM

## 2019-07-22 DIAGNOSIS — Z7901 Long term (current) use of anticoagulants: Secondary | ICD-10-CM

## 2019-07-22 DIAGNOSIS — I495 Sick sinus syndrome: Secondary | ICD-10-CM | POA: Diagnosis not present

## 2019-07-22 DIAGNOSIS — Z950 Presence of cardiac pacemaker: Secondary | ICD-10-CM | POA: Diagnosis not present

## 2019-07-22 DIAGNOSIS — E7800 Pure hypercholesterolemia, unspecified: Secondary | ICD-10-CM

## 2019-07-22 DIAGNOSIS — I2510 Atherosclerotic heart disease of native coronary artery without angina pectoris: Secondary | ICD-10-CM

## 2019-07-22 NOTE — Patient Instructions (Signed)
Medication Instructions:  Your physician recommends that you continue on your current medications as directed. Please refer to the Current Medication list given to you today.  If you need a refill on your cardiac medications before your next appointment, please call your pharmacy.   Lab work: None ordered If you have labs (blood work) drawn today and your tests are completely normal, you will receive your results only by: MyChart Message (if you have MyChart) OR A paper copy in the mail If you have any lab test that is abnormal or we need to change your treatment, we will call you to review the results.  Testing/Procedures: None ordered  Follow-Up: At [facility], you and your health needs are our priority.  As part of our continuing mission to provide you with exceptional heart care, we have created designated Provider Care Teams.  These Care Teams include your primary Cardiologist (physician) and Advanced Practice Providers (APPs -  Physician Assistants and Nurse Practitioners) who all work together to provide you with the care you need, when you need it. You will need a follow up appointment in 12 months.  Please call our office 2 months in advance to schedule this appointment.  You may see Mihai Croitoru. MD or one of the following Advanced Practice Providers on your designated Care Team: Hao Meng, PA-C Angela Duke, PA-C       

## 2019-07-22 NOTE — Progress Notes (Signed)
Cardiology Office Note    Date:  07/24/2019   ID:  [name], DOB 11/10/1933, MRN [id number]  PCP:  [facility]  Cardiologist:   Thurmon FairMihai Mindie Rawdon, MD   Chief Complaint  Patient presents with  . Atrial Fibrillation  . Pacemaker Check    History of Present Illness:  Ian AnoRonald E Gorelick is a 85 y.o. male with CAD s/p CABG, sinus node dysfunction and paroxysmal atrial fibrillation, status post dual-chamber permanent pacemaker implantation in 2007 (generator change 2015, Medtronic Adapta) returning for follow-up.   He is doing well, although he has not been able to plat tennis due to a left shoulder injury.  The patient specifically denies any chest pain at rest exertion, dyspnea at rest or with exertion, orthopnea, paroxysmal nocturnal dyspnea, syncope, palpitations, focal neurological deficits, intermittent claudication, lower extremity edema, unexplained weight gain, cough, hemoptysis or wheezing. He has not had falls or bleeding problems.  He has had a couple of skin cancers removed from his scalp.  His dermatologist is Dr. Park LiterAlbertini.  Interrogation of his pacemaker shows normal device function with estimated generator longevity of 10 years. There is 90% atrial pacing and no ventricular pacing.  The burden of atrial fibrillation is only 0.3%. Atrial fibrillation rate control is good.  He has occasional episodes of very brief paroxysmal atrial tachycardia with 1:1 AV conduction and faster rates.  He has not had any true ventricular tachycardia.  Normal left ventricular systolic function by echo most recently performed in 2014. Last nuclear stress test in 2012 was normal. He underwent bypass surgery in 2007 (Dr. Laneta SimmersBartle 2007, LIMA to LAD, SVG to OM, SVG to RCA). His pacemaker is a Medtronic Adapta dual-chamber device implanted in 2015 (leads from 2007). He has treated hyperlipidemia, followed by his primary care physician. He has mild thrombocytopenia without bleeding complications.   Past Medical History:  Diagnosis Date  . Arthritis   . Hyperlipidemia   . Hypertension   . Hypothyroidism   . Paroxysmal atrial fibrillation (HCC) 11/22/2014  . Thrombocytopenia (HCC) 05/23/2015    Past Surgical History:  Procedure Laterality Date  . bilateral carotid duplex ultrasound showed no ICA     stenosis  . CARDIAC CATHETERIZATION     Dr. Jenne CampusMcQueen which showed a 95% ostial LAD stenosis  . CORONARY ARTERY BYPASS GRAFT     x3 using a left internal mammary artery  . PACEMAKER GENERATOR CHANGE N/A 08/09/2014   Procedure: PACEMAKER GENERATOR CHANGE Donny Pique/BATTERY;  Surgeon: Thurmon FairMihai Natthew Marlatt, MD;  Location: [facility];  Service: Cardiovascular;  Laterality: N/A;  . Placement of permanent pacemaker    . US ECHOCARDIOGRAPHY     lft ventricular ejection fraction was about 60%    Current Medications: Outpatient Medications Prior to Visit  Medication Sig Dispense Refill  . ELIQUIS 5 MG TABS tablet TAKE ONE TABLET BY MOUTH TWICE DAILY 180 tablet 1  . liothyronine (CYTOMEL) 5 MCG tablet Take 5 mcg by mouth daily.    . metoprolol succinate (TOPROL-XL) 25 MG 24 hr tablet TAKE ONE TABLET BY MOUTH EVERY DAY 90 tablet 1  . rosuvastatin (CRESTOR) 10 MG tablet TAKE ONE TABLET BY MOUTH EVERY OTHER DAY 90 tablet 1  . SYNTHROID 100 MCG tablet Take 100 mg by mouth daily.     No facility-administered medications prior to visit.      Allergies:   Latex and Sulfa antibiotics   Social History   Socioeconomic History  . Marital status: Married    Spouse name:  Not on file  . Number of children: Not on file  . Years of education: Not on file  . Highest education level: Not on file  Occupational History  . Not on file  Social Needs  . Financial resource strain: Not on file  . Food insecurity    Worry: Not on file    Inability: Not on file  . Transportation needs    Medical: Not on file    Non-medical: Not on file  Tobacco Use  . Smoking status: Never Smoker  . Smokeless tobacco: Never Used   Substance and Sexual Activity  . Alcohol use: Yes    Comment: occas.  . Drug use: No  . Sexual activity: Not on file  Lifestyle  . Physical activity    Days per week: Not on file    Minutes per session: Not on file  . Stress: Not on file  Relationships  . Social Musician on phone: Not on file    Gets together: Not on file    Attends religious service: Not on file    Active member of club or organization: Not on file    Attends meetings of clubs or organizations: Not on file    Relationship status: Not on file  Other Topics Concern  . Not on file  Social History Narrative  . Not on file     Family History:  The patient's family history includes Hypertension in his mother; Stroke in his mother.   ROS:   Please see the history of present illness.    ROS all other systems are reviewed and are negative  PHYSICAL EXAM:   VS:  BP (!) 141/79   Pulse 83   Temp (!) 97.1 F (36.2 C)   Ht 5\' 8"  (1.727 m)   Wt 163 lb 3.2 oz (74 kg)   SpO2 96%   BMI 24.81 kg/m      General: Alert, oriented x3, no distress, appears lean and fit and younger than stated age Head: no evidence of trauma, PERRL, EOMI, no exophtalmos or lid lag, no myxedema, no xanthelasma; normal ears, nose and oropharynx Neck: normal jugular venous pulsations and no hepatojugular reflux; brisk carotid pulses without delay and no carotid bruits Chest: clear to auscultation, no signs of consolidation by percussion or palpation, normal fremitus, symmetrical and full respiratory excursions Cardiovascular: normal position and quality of the apical impulse, regular rhythm, normal first and second heart sounds, no murmurs, rubs or gallops Abdomen: no tenderness or distention, no masses by palpation, no abnormal pulsatility or arterial bruits, normal bowel sounds, no hepatosplenomegaly Extremities: no clubbing, cyanosis or edema; 2+ radial, ulnar and brachial pulses bilaterally; 2+ right femoral, posterior tibial  and dorsalis pedis pulses; 2+ left femoral, posterior tibial and dorsalis pedis pulses; no subclavian or femoral bruits Neurological: grossly nonfocal Psych: Normal mood and affect    Wt Readings from Last 3 Encounters:  07/22/19 163 lb 3.2 oz (74 kg)  07/16/18 167 lb (75.8 kg)  01/15/18 170 lb (77.1 kg)      Studies/Labs Reviewed:   EKG:  EKG is ordered today.  Shows atrial paced, ventricular sensed rhythm with a vertical axis but otherwise normal.  QTc 418 ms  Recent Labs: Labs from November 2019 show total cholesterol 162, triglycerides 59, HDL 52, LDL 99 July 21, 2019 hemoglobin 15.2 creatinine 1.1  ASSESSMENT:    1. Paroxysmal atrial fibrillation (HCC)   2. Long term current use of anticoagulant  3. SSS (sick sinus syndrome) ([place])   4. Pacemaker   5. Coronary artery disease involving native coronary artery of native heart without angina pectoris   6. Essential hypertension   7. Hypercholesterolemia      PLAN:  In order of problems listed above:  1. AFib: Although the overall burden of atrial fibrillation is very low, his episodes do last for a few hours at a time and anticoagulation is appropriate.  However temporary interruption of the anticoagulant for medical procedures would be expected to be very safe. The arrhythmia is largely asymptomatic.  Ventricular rate control is good.  On appropriate anticoagulation.  CHADSVasc 4 (age 9, HTN, CAD).  2. Eliquis: Denies falls or bleeding problems 3. SSS: Despite nearly 100% atrial pacing he has no symptoms of chronotropic incompetence and has favorable heart rate histogram distribution. 4. PPM: Normal device function.  He has to leave the house and go into a neighboring parking lot to be able to do downloads due to poor cell signal at his home.  Continue to try to get downloads every 3 months (most recently performed May 24, 2019 5. CAD: Roughly 13 years following his bypass procedure he has excellent functional status  and remains asymptomatic 6. HTN: Usually well controlled.  Blood pressure was high today but he was upset because he was late for his appointment due to the malfunctioning elevator.  No changes made to his medication. 7. HLP: Last LDL was 73, very close to target. On highly active statin.  No changes made to lipid-lowering program.    Medication Adjustments/Labs and Tests Ordered: Current medicines are reviewed at length with the patient today.  Concerns regarding medicines are outlined above.  Medication changes, Labs and Tests ordered today are listed in the Patient Instructions below. Patient Instructions  Medication Instructions:  Your physician recommends that you continue on your current medications as directed. Please refer to the Current Medication list given to you today.  If you need a refill on your cardiac medications before your next appointment, please call your pharmacy.   Lab work: None ordered If you have labs (blood work) drawn today and your tests are completely normal, you will receive your results only by: [place] (if you have MyChart) OR A paper copy in the mail If you have any lab test that is abnormal or we need to change your treatment, we will call you to review the results.  Testing/Procedures: None ordered  Follow-Up: At [facility], you and your health needs are our priority.  As part of our continuing mission to provide you with exceptional heart care, we have created designated Provider Care Teams.  These Care Teams include your primary Cardiologist (physician) and Advanced Practice Providers (APPs -  Physician Assistants and Nurse Practitioners) who all work together to provide you with the care you need, when you need it. You will need a follow up appointment in 12 months.  Please call our office 2 months in advance to schedule this appointment.  You may see Zoe Creasman. MD or one of the following Advanced Practice Providers on your designated  Care Team: Almyra Deforest, PA-C Fabian Sharp, [place]           Signed, Sanda Klein, MD  07/24/2019 1:51 PM    [facility] [address], [place], [place]  [address] Phone: [phone]; Fax: [phone]

## 2019-07-24 ENCOUNTER — Encounter: Payer: Self-pay | Admitting: Cardiovascular Disease

## 2019-08-01 ENCOUNTER — Other Ambulatory Visit: Payer: Self-pay | Admitting: Cardiovascular Disease

## 2019-08-24 ENCOUNTER — Ambulatory Visit (INDEPENDENT_AMBULATORY_CARE_PROVIDER_SITE_OTHER): Payer: Medicare Other | Admitting: *Deleted

## 2019-08-24 DIAGNOSIS — I480 Paroxysmal atrial fibrillation: Secondary | ICD-10-CM

## 2019-08-24 DIAGNOSIS — I495 Sick sinus syndrome: Secondary | ICD-10-CM

## 2019-08-25 LAB — CUP PACEART REMOTE DEVICE CHECK
Battery Impedance: 256 Ohm
Battery Remaining Longevity: 112 mo
Battery Voltage: 2.79 V
Brady Statistic AP VP Percent: 0 %
Brady Statistic AP VS Percent: 90 %
Brady Statistic AS VP Percent: 0 %
Brady Statistic AS VS Percent: 10 %
Date Time Interrogation Session: 20201013212336
Implantable Lead Implant Date: 20071115
Implantable Lead Implant Date: 20071115
Implantable Lead Location: 753859
Implantable Lead Location: 753860
Implantable Lead Model: 4092
Implantable Lead Model: 5076
Implantable Pulse Generator Implant Date: 20150929
Lead Channel Impedance Value: 508 Ohm
Lead Channel Impedance Value: 621 Ohm
Lead Channel Pacing Threshold Amplitude: 0.5 V
Lead Channel Pacing Threshold Amplitude: 0.5 V
Lead Channel Pacing Threshold Pulse Width: 0.4 ms
Lead Channel Pacing Threshold Pulse Width: 0.4 ms
Lead Channel Setting Pacing Amplitude: 2 V
Lead Channel Setting Pacing Amplitude: 2.5 V
Lead Channel Setting Pacing Pulse Width: 0.4 ms
Lead Channel Setting Sensing Sensitivity: 5.6 mV

## 2019-09-03 NOTE — Progress Notes (Signed)
Remote pacemaker transmission.   

## 2019-10-02 ENCOUNTER — Other Ambulatory Visit: Payer: Self-pay | Admitting: Cardiovascular Disease

## 2019-11-23 ENCOUNTER — Ambulatory Visit (INDEPENDENT_AMBULATORY_CARE_PROVIDER_SITE_OTHER): Payer: Medicare PPO | Admitting: *Deleted

## 2019-11-23 DIAGNOSIS — I480 Paroxysmal atrial fibrillation: Secondary | ICD-10-CM

## 2019-11-23 LAB — CUP PACEART REMOTE DEVICE CHECK
Battery Impedance: 305 Ohm
Battery Remaining Longevity: 106 mo
Battery Voltage: 2.79 V
Brady Statistic AP VP Percent: 0 %
Brady Statistic AP VS Percent: 90 %
Brady Statistic AS VP Percent: 0 %
Brady Statistic AS VS Percent: 9 %
Date Time Interrogation Session: 20210112163150
Implantable Lead Implant Date: 20071115
Implantable Lead Implant Date: 20071115
Implantable Lead Location: 753859
Implantable Lead Location: 753860
Implantable Lead Model: 4092
Implantable Lead Model: 5076
Implantable Pulse Generator Implant Date: 20150929
Lead Channel Impedance Value: 493 Ohm
Lead Channel Impedance Value: 599 Ohm
Lead Channel Pacing Threshold Amplitude: 0.5 V
Lead Channel Pacing Threshold Amplitude: 0.5 V
Lead Channel Pacing Threshold Pulse Width: 0.4 ms
Lead Channel Pacing Threshold Pulse Width: 0.4 ms
Lead Channel Setting Pacing Amplitude: 2 V
Lead Channel Setting Pacing Amplitude: 2.5 V
Lead Channel Setting Pacing Pulse Width: 0.4 ms
Lead Channel Setting Sensing Sensitivity: 5.6 mV

## 2019-12-11 ENCOUNTER — Ambulatory Visit: Payer: Self-pay

## 2019-12-16 ENCOUNTER — Ambulatory Visit: Payer: Self-pay

## 2019-12-16 ENCOUNTER — Ambulatory Visit: Payer: Medicare PPO | Attending: Internal Medicine

## 2019-12-16 DIAGNOSIS — Z23 Encounter for immunization: Secondary | ICD-10-CM | POA: Insufficient documentation

## 2019-12-16 NOTE — Progress Notes (Signed)
   Covid-19 Vaccination Clinic  Name:  WALLIE LAGRAND    MRN: [id number] DOB: 09-Jun-1933  12/16/2019  Mr. Milles was observed post Covid-19 immunization for 15 minutes without incidence. He was provided with Vaccine Information Sheet and instruction to access the V-Safe system.   Mr. Armstrong was instructed to call 911 with any severe reactions post vaccine: Marland Kitchen Difficulty breathing  . Swelling of your face and throat  . A fast heartbeat  . A bad rash all over your body  . Dizziness and weakness    Immunizations Administered    Name Date Dose VIS Date Route   Pfizer COVID-19 Vaccine 12/16/2019 10:45 AM 0.3 mL 10/22/2019 Intramuscular   Manufacturer: [employer], [employer]   Lot: [id number]   NDC: [address]-4

## 2020-01-10 ENCOUNTER — Ambulatory Visit: Payer: Medicare PPO | Attending: Internal Medicine

## 2020-01-10 DIAGNOSIS — Z23 Encounter for immunization: Secondary | ICD-10-CM | POA: Insufficient documentation

## 2020-01-10 NOTE — Progress Notes (Signed)
   Covid-19 Vaccination Clinic  Name:  [name]    MRN: [id number] DOB: 1932-12-18  01/10/2020  [name] was observed post Covid-19 immunization for 15 minutes without incidence. He was provided with Vaccine Information Sheet and instruction to access the V-Safe system.   [name] was instructed to call 911 with any severe reactions post vaccine: Marland Kitchen Difficulty breathing  . Swelling of your face and throat  . A fast heartbeat  . A bad rash all over your body  . Dizziness and weakness    Immunizations Administered    Name Date Dose VIS Date Route   Pfizer COVID-19 Vaccine 01/10/2020 11:22 AM 0.3 mL 10/22/2019 Intramuscular   Manufacturer: [employer], [employer]   Lot: [id number]   NDC: [address]-0

## 2020-02-10 ENCOUNTER — Other Ambulatory Visit: Payer: Self-pay | Admitting: Cardiovascular Disease

## 2020-02-22 ENCOUNTER — Ambulatory Visit (INDEPENDENT_AMBULATORY_CARE_PROVIDER_SITE_OTHER): Payer: Medicare PPO | Admitting: *Deleted

## 2020-02-22 DIAGNOSIS — I480 Paroxysmal atrial fibrillation: Secondary | ICD-10-CM | POA: Diagnosis not present

## 2020-02-23 LAB — CUP PACEART REMOTE DEVICE CHECK
Battery Impedance: 305 Ohm
Battery Remaining Longevity: 106 mo
Battery Voltage: 2.79 V
Brady Statistic AP VP Percent: 0 %
Brady Statistic AP VS Percent: 90 %
Brady Statistic AS VP Percent: 0 %
Brady Statistic AS VS Percent: 9 %
Date Time Interrogation Session: 20210414135645
Implantable Lead Implant Date: 20071115
Implantable Lead Implant Date: 20071115
Implantable Lead Location: 753859
Implantable Lead Location: 753860
Implantable Lead Model: 4092
Implantable Lead Model: 5076
Implantable Pulse Generator Implant Date: 20150929
Lead Channel Impedance Value: 500 Ohm
Lead Channel Impedance Value: 616 Ohm
Lead Channel Pacing Threshold Amplitude: 0.5 V
Lead Channel Pacing Threshold Amplitude: 0.5 V
Lead Channel Pacing Threshold Pulse Width: 0.4 ms
Lead Channel Pacing Threshold Pulse Width: 0.4 ms
Lead Channel Setting Pacing Amplitude: 2 V
Lead Channel Setting Pacing Amplitude: 2.5 V
Lead Channel Setting Pacing Pulse Width: 0.4 ms
Lead Channel Setting Sensing Sensitivity: 5.6 mV

## 2020-03-14 ENCOUNTER — Other Ambulatory Visit: Payer: Self-pay | Admitting: Cardiovascular Disease

## 2020-06-09 ENCOUNTER — Telehealth: Payer: Self-pay

## 2020-06-09 NOTE — Telephone Encounter (Signed)
The pt handheld was not working. I gave him the number to carelink tech support.

## 2020-06-14 ENCOUNTER — Other Ambulatory Visit: Payer: Self-pay | Admitting: Cardiovascular Disease

## 2020-06-14 ENCOUNTER — Ambulatory Visit (INDEPENDENT_AMBULATORY_CARE_PROVIDER_SITE_OTHER): Payer: Medicare PPO | Admitting: *Deleted

## 2020-06-14 DIAGNOSIS — I495 Sick sinus syndrome: Secondary | ICD-10-CM | POA: Diagnosis not present

## 2020-06-14 LAB — CUP PACEART REMOTE DEVICE CHECK
Battery Impedance: 379 Ohm
Battery Remaining Longevity: 98 mo
Battery Voltage: 2.78 V
Brady Statistic AP VP Percent: 0 %
Brady Statistic AP VS Percent: 91 %
Brady Statistic AS VP Percent: 0 %
Brady Statistic AS VS Percent: 9 %
Date Time Interrogation Session: 20210804111240
Implantable Lead Implant Date: 20071115
Implantable Lead Implant Date: 20071115
Implantable Lead Location: 753859
Implantable Lead Location: 753860
Implantable Lead Model: 4092
Implantable Lead Model: 5076
Implantable Pulse Generator Implant Date: 20150929
Lead Channel Impedance Value: 487 Ohm
Lead Channel Impedance Value: 610 Ohm
Lead Channel Pacing Threshold Amplitude: 0.5 V
Lead Channel Pacing Threshold Amplitude: 0.625 V
Lead Channel Pacing Threshold Pulse Width: 0.4 ms
Lead Channel Pacing Threshold Pulse Width: 0.4 ms
Lead Channel Setting Pacing Amplitude: 2 V
Lead Channel Setting Pacing Amplitude: 2.5 V
Lead Channel Setting Pacing Pulse Width: 0.4 ms
Lead Channel Setting Sensing Sensitivity: 5.6 mV

## 2020-06-15 NOTE — Progress Notes (Signed)
Remote pacemaker transmission.   

## 2020-07-31 ENCOUNTER — Ambulatory Visit (INDEPENDENT_AMBULATORY_CARE_PROVIDER_SITE_OTHER): Payer: Medicare PPO | Admitting: Cardiovascular Disease

## 2020-07-31 ENCOUNTER — Encounter: Payer: Self-pay | Admitting: Cardiovascular Disease

## 2020-07-31 ENCOUNTER — Other Ambulatory Visit: Payer: Self-pay

## 2020-07-31 VITALS — BP 158/76 | HR 71 | Ht 69.0 in | Wt 173.4 lb

## 2020-07-31 DIAGNOSIS — I10 Essential (primary) hypertension: Secondary | ICD-10-CM

## 2020-07-31 DIAGNOSIS — I480 Paroxysmal atrial fibrillation: Secondary | ICD-10-CM

## 2020-07-31 DIAGNOSIS — Z7901 Long term (current) use of anticoagulants: Secondary | ICD-10-CM

## 2020-07-31 DIAGNOSIS — E7800 Pure hypercholesterolemia, unspecified: Secondary | ICD-10-CM

## 2020-07-31 DIAGNOSIS — Z950 Presence of cardiac pacemaker: Secondary | ICD-10-CM | POA: Diagnosis not present

## 2020-07-31 DIAGNOSIS — I25810 Atherosclerosis of coronary artery bypass graft(s) without angina pectoris: Secondary | ICD-10-CM

## 2020-07-31 DIAGNOSIS — I495 Sick sinus syndrome: Secondary | ICD-10-CM

## 2020-07-31 NOTE — Progress Notes (Signed)
Cardiology Office Note    Date:  08/01/2020   ID:  [name], DOB [birth year], MRN [id number]  PCP:  [facility]  Cardiologist:   Thurmon Fair, MD   Chief Complaint  Patient presents with  . Pacemaker Check    History of Present Illness:  [name] is a 86 y.o. male with CAD s/p CABG, sinus node dysfunction and paroxysmal atrial fibrillation, status post dual-chamber permanent pacemaker implantation in 2007 (generator change 2015, Medtronic Adapta) returning for follow-up.   He is doing very well.  He is seeing a chiropractor and has received some exercises for shoulder impingement and acupuncture and has much better motion in his right shoulder.  He is playing tennis and practicing serves again. The patient specifically denies any chest pain at rest exertion, dyspnea at rest or with exertion, orthopnea, paroxysmal nocturnal dyspnea, syncope, palpitations, focal neurological deficits, intermittent claudication, lower extremity edema, unexplained weight gain, cough, hemoptysis or wheezing.  Interrogation of his pacemaker shows normal device function with estimated generator longevity of 8 years. There is 91 % atrial pacing and 0.3% ventricular pacing.  The burden of atrial fibrillation is only 0.4 %. Atrial fibrillation rate control is good.  As before, he has occasional episodes of very brief paroxysmal atrial tachycardia with 1:1 AV conduction and faster rates.  He has not had any true ventricular tachycardia.  Normal left ventricular systolic function by echo most recently performed in 2014. Last nuclear stress test in 2012 was normal. He underwent bypass surgery in 2007 (Dr. Laneta Simmers 2007, LIMA to LAD, SVG to OM, SVG to RCA). His pacemaker is a Medtronic Adapta dual-chamber device implanted in 2015 (leads from 2007). He has treated hyperlipidemia, followed by his primary care physician. He has mild thrombocytopenia without bleeding complications.  Past Medical History:   Diagnosis Date  . Arthritis   . Hyperlipidemia   . Hypertension   . Hypothyroidism   . Paroxysmal atrial fibrillation (HCC) 11/22/2014  . Thrombocytopenia (HCC) 05/23/2015    Past Surgical History:  Procedure Laterality Date  . bilateral carotid duplex ultrasound showed no ICA     stenosis  . CARDIAC CATHETERIZATION     Dr. Jenne Campus which showed a 95% ostial LAD stenosis  . CORONARY ARTERY BYPASS GRAFT     x3 using a left internal mammary artery  . PACEMAKER GENERATOR CHANGE N/A 08/09/2014   Procedure: PACEMAKER GENERATOR CHANGE Donny Pique;  Surgeon: Thurmon Fair, MD;  Location: [facility] CATH [facility];  Service: Cardiovascular;  Laterality: N/A;  . Placement of permanent pacemaker    . US ECHOCARDIOGRAPHY     lft ventricular ejection fraction was about 60%    Current Medications: Outpatient Medications Prior to Visit  Medication Sig Dispense Refill  . ELIQUIS 5 MG TABS tablet TAKE ONE (1) TABLET BY MOUTH TWO TIMES PER DAY 180 tablet 1  . liothyronine (CYTOMEL) 5 MCG tablet Take 5 mcg by mouth daily.    . metoprolol succinate (TOPROL-XL) 25 MG 24 hr tablet TAKE ONE TABLET BY MOUTH EVERY DAY 90 tablet 1  . rosuvastatin (CRESTOR) 10 MG tablet TAKE ONE TABLET BY MOUTH EVERY OTHER DAY 90 tablet 1  . SYNTHROID 100 MCG tablet Take 100 mg by mouth daily.     No facility-administered medications prior to visit.     Allergies:   Latex and Sulfa antibiotics   Social History   Socioeconomic History  . Marital status: Married    Spouse name: Not on file  . Number  of children: Not on file  . Years of education: Not on file  . Highest education level: Not on file  Occupational History  . Not on file  Tobacco Use  . Smoking status: Never Smoker  . Smokeless tobacco: Never Used  Substance and Sexual Activity  . Alcohol use: Yes    Comment: occas.  . Drug use: No  . Sexual activity: Not on file  Other Topics Concern  . Not on file  Social History Narrative  . Not on file   Social  Determinants of Health   Financial Resource Strain:   . Difficulty of Paying Living Expenses: Not on file  Food Insecurity:   . Worried About Programme researcher, broadcasting/film/video in the Last Year: Not on file  . Ran Out of Food in the Last Year: Not on file  Transportation Needs:   . Lack of Transportation (Medical): Not on file  . Lack of Transportation (Non-Medical): Not on file  Physical Activity:   . Days of Exercise per Week: Not on file  . Minutes of Exercise per Session: Not on file  Stress:   . Feeling of Stress : Not on file  Social Connections:   . Frequency of Communication with Friends and Family: Not on file  . Frequency of Social Gatherings with Friends and Family: Not on file  . Attends Religious Services: Not on file  . Active Member of Clubs or Organizations: Not on file  . Attends Banker Meetings: Not on file  . Marital Status: Not on file     Family History:  The patient's family history includes Hypertension in his mother; Stroke in his mother.   ROS:   Please see the history of present illness.    ROS All other systems are reviewed and are negative.  PHYSICAL EXAM:   VS:  BP (!) 158/76   Pulse 71   Ht 5\' 9"  (1.753 m)   Wt 173 lb 6.4 oz (78.7 kg)   SpO2 100%   BMI 25.61 kg/m      General: Alert, oriented x3, no distress, appears younger than stated age.  Fit. Head: no evidence of trauma, PERRL, EOMI, no exophtalmos or lid lag, no myxedema, no xanthelasma; normal ears, nose and oropharynx Neck: normal jugular venous pulsations and no hepatojugular reflux; brisk carotid pulses without delay and no carotid bruits Chest: clear to auscultation, no signs of consolidation by percussion or palpation, normal fremitus, symmetrical and full respiratory excursions Cardiovascular: normal position and quality of the apical impulse, regular rhythm, normal first and second heart sounds, barely audible aortic ejection murmur, no diastolic murmurs, rubs or gallops Abdomen:  no tenderness or distention, no masses by palpation, no abnormal pulsatility or arterial bruits, normal bowel sounds, no hepatosplenomegaly Extremities: no clubbing, cyanosis or edema; 2+ radial, ulnar and brachial pulses bilaterally; 2+ right femoral, posterior tibial and dorsalis pedis pulses; 2+ left femoral, posterior tibial and dorsalis pedis pulses; no subclavian or femoral bruits Neurological: grossly nonfocal Psych: Normal mood and affect    Wt Readings from Last 3 Encounters:  07/31/20 173 lb 6.4 oz (78.7 kg)  07/22/19 163 lb 3.2 oz (74 kg)  07/16/18 167 lb (75.8 kg)      Studies/Labs Reviewed:   EKG:  EKG is ordered today.  It shows atrial paced, ventricular sensed rhythm with a long AV delay of 240 ms, otherwise normal tracing.  QTc is 410 ms.  Recent Labs: Labs from November 30, 2019 show total cholesterol  152, triglycerides 57, HDL 51, LDL 90 Labs from May 31, 2020 show potassium 5.0, creatinine 1.2, glucose 100, TSH 4.789, hemoglobin 14.5  ASSESSMENT:    1. Paroxysmal atrial fibrillation (HCC)   2. Long term current use of anticoagulant   3. SSS (sick sinus syndrome) (HCC)   4. Pacemaker   5. Coronary artery disease involving coronary bypass graft of native heart without angina pectoris   6. Essential hypertension   7. Hypercholesterolemia      PLAN:  In order of problems listed above:  1. AFib: Although he has a very low overall burden of paroxysmal atrial fibrillation, some of his episodes last for a few hours.  Rate control is good.  He is on anticoagulation.  CHADSVasc 4 (age 19, HTN, CAD).  2. Eliquis: No injuries or bleeding complications. 3. SSS: Current sensor settings appear appropriate, he does not have symptoms of chronotropic incompetence and his heart rate histogram looks favorable. 4. PPM: Normal device function.  Has poor cell signal and has to go to the "[employer] [place]" to do his downloads. 5. CAD: Asymptomatic 14 years after bypass procedure,  despite a very active lifestyle. 6. HTN: His blood pressure was slightly high today (he blames the traffic), but at a visit with Dr. Letitia Libra in late July his blood pressure was 122/82.  No changes were made to his medications. 7. HLP: On highly active statin.  His LDL cholesterol has increased and is now above our target of 70 or less.  He has gained a little bit of weight.  Try to get back to his usual exercise program and diet.  If his LDL is still higher than 70 at the next check would recommend increasing the dose of rosuvastatin to 20 mg daily.   Medication Adjustments/Labs and Tests Ordered: Current medicines are reviewed at length with the patient today.  Concerns regarding medicines are outlined above.  Medication changes, Labs and Tests ordered today are listed in the Patient Instructions below. Patient Instructions  Medication Instructions:  No changes *If you need a refill on your cardiac medications before your next appointment, please call your pharmacy*   Lab Work: None ordered If you have labs (blood work) drawn today and your tests are completely normal, you will receive your results only by: [place] MyChart Message (if you have MyChart) OR . A paper copy in the mail If you have any lab test that is abnormal or we need to change your treatment, we will call you to review the results.   Testing/Procedures: None ordered   Follow-Up: At [facility], you and your health needs are our priority.  As part of our continuing mission to provide you with exceptional heart care, we have created designated Provider Care Teams.  These Care Teams include your primary Cardiologist (physician) and Advanced Practice Providers (APPs -  Physician Assistants and Nurse Practitioners) who all work together to provide you with the care you need, when you need it.  We recommend signing up for the patient portal called "MyChart".  Sign up information is provided on this After Visit Summary.  MyChart  is used to connect with patients for Virtual Visits (Telemedicine).  Patients are able to view lab/test results, encounter notes, upcoming appointments, etc.  Non-urgent messages can be sent to your provider as well.   To learn more about what you can do with MyChart, go to [email or web address].    Your next appointment:   12 month(s)  The format for your next appointment:  In Person  Provider:   Thurmon FairMihai Raymona Boss, MD      Signed, Thurmon FairMihai Amit Meloy, MD  08/01/2020 10:28 AM    [facility] [address], [place], [place]  [address] Phone: [phone]) [phone]; Fax: [phone]

## 2020-07-31 NOTE — Patient Instructions (Signed)

## 2020-08-29 ENCOUNTER — Ambulatory Visit: Payer: Medicare PPO

## 2020-08-29 ENCOUNTER — Other Ambulatory Visit: Payer: Self-pay | Admitting: Internal Medicine

## 2020-08-29 ENCOUNTER — Ambulatory Visit: Payer: Medicare PPO | Attending: Internal Medicine

## 2020-08-29 DIAGNOSIS — Z23 Encounter for immunization: Secondary | ICD-10-CM

## 2020-08-29 NOTE — Progress Notes (Signed)
° °  Covid-19 Vaccination Clinic  Name:  CONN TROMBETTA    MRN: [id number] DOB: [birth year]  08/29/2020  Mr. Forrester was observed post Covid-19 immunization for 15 minutes without incident. He was provided with Vaccine Information Sheet and instruction to access the V-Safe system.   Mr. Kaupp was instructed to call 911 with any severe reactions post vaccine:  Difficulty breathing   Swelling of face and throat   A fast heartbeat   A bad rash all over body   Dizziness and weakness

## 2020-09-13 ENCOUNTER — Ambulatory Visit (INDEPENDENT_AMBULATORY_CARE_PROVIDER_SITE_OTHER): Payer: Medicare PPO

## 2020-09-13 DIAGNOSIS — I495 Sick sinus syndrome: Secondary | ICD-10-CM

## 2020-09-15 LAB — CUP PACEART REMOTE DEVICE CHECK
Battery Impedance: 428 Ohm
Battery Remaining Longevity: 94 mo
Battery Voltage: 2.78 V
Brady Statistic AP VP Percent: 0 %
Brady Statistic AP VS Percent: 88 %
Brady Statistic AS VP Percent: 0 %
Brady Statistic AS VS Percent: 12 %
Date Time Interrogation Session: 20211104154823
Implantable Lead Implant Date: 20071115
Implantable Lead Implant Date: 20071115
Implantable Lead Location: 753859
Implantable Lead Location: 753860
Implantable Lead Model: 4092
Implantable Lead Model: 5076
Implantable Pulse Generator Implant Date: 20150929
Lead Channel Impedance Value: 494 Ohm
Lead Channel Impedance Value: 580 Ohm
Lead Channel Pacing Threshold Amplitude: 0.5 V
Lead Channel Pacing Threshold Amplitude: 0.625 V
Lead Channel Pacing Threshold Pulse Width: 0.4 ms
Lead Channel Pacing Threshold Pulse Width: 0.4 ms
Lead Channel Setting Pacing Amplitude: 2 V
Lead Channel Setting Pacing Amplitude: 2.5 V
Lead Channel Setting Pacing Pulse Width: 0.4 ms
Lead Channel Setting Sensing Sensitivity: 5.6 mV

## 2020-09-18 ENCOUNTER — Other Ambulatory Visit: Payer: Self-pay | Admitting: Cardiovascular Disease

## 2020-09-18 NOTE — Progress Notes (Signed)
Remote pacemaker transmission.   

## 2020-12-13 ENCOUNTER — Ambulatory Visit (INDEPENDENT_AMBULATORY_CARE_PROVIDER_SITE_OTHER): Payer: Medicare PPO

## 2020-12-13 DIAGNOSIS — I495 Sick sinus syndrome: Secondary | ICD-10-CM | POA: Diagnosis not present

## 2020-12-13 LAB — CUP PACEART REMOTE DEVICE CHECK
Battery Impedance: 428 Ohm
Battery Remaining Longevity: 95 mo
Battery Voltage: 2.78 V
Brady Statistic AP VP Percent: 0 %
Brady Statistic AP VS Percent: 90 %
Brady Statistic AS VP Percent: 0 %
Brady Statistic AS VS Percent: 10 %
Date Time Interrogation Session: 20220202144349
Implantable Lead Implant Date: 20071115
Implantable Lead Implant Date: 20071115
Implantable Lead Location: 753859
Implantable Lead Location: 753860
Implantable Lead Model: 4092
Implantable Lead Model: 5076
Implantable Pulse Generator Implant Date: 20150929
Lead Channel Impedance Value: 508 Ohm
Lead Channel Impedance Value: 608 Ohm
Lead Channel Pacing Threshold Amplitude: 0.5 V
Lead Channel Pacing Threshold Amplitude: 0.625 V
Lead Channel Pacing Threshold Pulse Width: 0.4 ms
Lead Channel Pacing Threshold Pulse Width: 0.4 ms
Lead Channel Setting Pacing Amplitude: 2 V
Lead Channel Setting Pacing Amplitude: 2.5 V
Lead Channel Setting Pacing Pulse Width: 0.4 ms
Lead Channel Setting Sensing Sensitivity: 5.6 mV

## 2020-12-18 ENCOUNTER — Other Ambulatory Visit: Payer: Self-pay | Admitting: Cardiovascular Disease

## 2020-12-18 NOTE — Telephone Encounter (Signed)
23m, 78.7kg, scr 1.1 (12/05/20), lovw/croitoru (09/13/20) refill received for eliquis 5mg  pt qualifies so refill will be sent

## 2020-12-18 NOTE — Progress Notes (Signed)
Remote pacemaker transmission.   

## 2021-03-14 ENCOUNTER — Ambulatory Visit (INDEPENDENT_AMBULATORY_CARE_PROVIDER_SITE_OTHER): Payer: Medicare PPO

## 2021-03-14 DIAGNOSIS — I495 Sick sinus syndrome: Secondary | ICD-10-CM | POA: Diagnosis not present

## 2021-03-15 LAB — CUP PACEART REMOTE DEVICE CHECK
Battery Impedance: 527 Ohm
Battery Remaining Longevity: 87 mo
Battery Voltage: 2.78 V
Brady Statistic AP VP Percent: 0 %
Brady Statistic AP VS Percent: 88 %
Brady Statistic AS VP Percent: 0 %
Brady Statistic AS VS Percent: 12 %
Date Time Interrogation Session: 20220505100312
Implantable Lead Implant Date: 20071115
Implantable Lead Implant Date: 20071115
Implantable Lead Location: 753859
Implantable Lead Location: 753860
Implantable Lead Model: 4092
Implantable Lead Model: 5076
Implantable Pulse Generator Implant Date: 20150929
Lead Channel Impedance Value: 508 Ohm
Lead Channel Impedance Value: 606 Ohm
Lead Channel Pacing Threshold Amplitude: 0.5 V
Lead Channel Pacing Threshold Amplitude: 0.5 V
Lead Channel Pacing Threshold Pulse Width: 0.4 ms
Lead Channel Pacing Threshold Pulse Width: 0.4 ms
Lead Channel Setting Pacing Amplitude: 2 V
Lead Channel Setting Pacing Amplitude: 2.5 V
Lead Channel Setting Pacing Pulse Width: 0.4 ms
Lead Channel Setting Sensing Sensitivity: 5.6 mV

## 2021-03-21 ENCOUNTER — Other Ambulatory Visit: Payer: Self-pay | Admitting: Ophthalmology

## 2021-03-21 DIAGNOSIS — H4720 Unspecified optic atrophy: Secondary | ICD-10-CM

## 2021-03-22 ENCOUNTER — Telehealth: Payer: Self-pay | Admitting: Cardiovascular Disease

## 2021-03-22 NOTE — Telephone Encounter (Signed)
Returned patients phone call.   Explained to patient the acronyms used are how we use medical terminology to discuss heart rhythms and wording. Explained to patient his AF burden is currently 0.9% which is consistent with his previous remotes. Patient thankful for explanation.   Patient states about 1 month ago he experienced a day where he had to stop playing tennis d/t "not feeing well". He states he had to go home and has been concerned about his activity level. Patient states he would like to see Dr. Royann Shivers in the office sooner than later. Advised his next apt. Would be in 07/2021, patient would like to be seen sooner. Advised patient I will send a message to Misty Stanley so someone can speak to him further about making an apt. Patient agreeable to plan.

## 2021-03-22 NOTE — Telephone Encounter (Signed)
Follow Up:    He would like for somebody to explain the results from his remore viisiit on 03-15-21 or 03-14-21. He said something was on My Chart, but he did nit understand it.

## 2021-03-23 NOTE — Telephone Encounter (Signed)
Left a message for the patient to call back.  

## 2021-03-27 NOTE — Telephone Encounter (Signed)
Left a message for the patient to call back.  

## 2021-03-28 NOTE — Telephone Encounter (Signed)
Left a third message for the patient to call back to schedule an appointment.

## 2021-04-03 NOTE — Progress Notes (Signed)
Remote pacemaker transmission.   

## 2021-04-04 ENCOUNTER — Ambulatory Visit
Admission: RE | Admit: 2021-04-04 | Discharge: 2021-04-04 | Disposition: A | Discharge status: Home / Self Care | Payer: Medicare PPO | Source: Ambulatory Visit | Attending: Ophthalmology | Admitting: Ophthalmology

## 2021-04-04 ENCOUNTER — Other Ambulatory Visit: Payer: Self-pay

## 2021-04-04 DIAGNOSIS — H4720 Unspecified optic atrophy: Secondary | ICD-10-CM | POA: Insufficient documentation

## 2021-04-19 ENCOUNTER — Ambulatory Visit: Payer: Self-pay

## 2021-04-19 NOTE — Telephone Encounter (Signed)
Pt. Scheduled for COVID 19 booster 04/16/21. Wife currently has COVID 19. Pt. Rescheduled for 05/01/21.

## 2021-04-20 ENCOUNTER — Ambulatory Visit: Payer: Medicare PPO

## 2021-05-01 ENCOUNTER — Ambulatory Visit: Payer: Medicare PPO

## 2021-06-13 ENCOUNTER — Ambulatory Visit: Payer: Medicare PPO

## 2021-06-13 DIAGNOSIS — I495 Sick sinus syndrome: Secondary | ICD-10-CM

## 2021-06-13 LAB — CUP PACEART REMOTE DEVICE CHECK
Battery Impedance: 552 Ohm
Battery Remaining Longevity: 85 mo
Battery Voltage: 2.78 V
Brady Statistic AP VP Percent: 0 %
Brady Statistic AP VS Percent: 88 %
Brady Statistic AS VP Percent: 0 %
Brady Statistic AS VS Percent: 12 %
Date Time Interrogation Session: 20220803111411
Implantable Lead Implant Date: 20071115
Implantable Lead Implant Date: 20071115
Implantable Lead Location: 753859
Implantable Lead Location: 753860
Implantable Lead Model: 4092
Implantable Lead Model: 5076
Implantable Pulse Generator Implant Date: 20150929
Lead Channel Impedance Value: 515 Ohm
Lead Channel Impedance Value: 618 Ohm
Lead Channel Pacing Threshold Amplitude: 0.5 V
Lead Channel Pacing Threshold Amplitude: 0.625 V
Lead Channel Pacing Threshold Pulse Width: 0.4 ms
Lead Channel Pacing Threshold Pulse Width: 0.4 ms
Lead Channel Setting Pacing Amplitude: 2 V
Lead Channel Setting Pacing Amplitude: 2.5 V
Lead Channel Setting Pacing Pulse Width: 0.4 ms
Lead Channel Setting Sensing Sensitivity: 5.6 mV

## 2021-07-10 NOTE — Progress Notes (Signed)
Remote pacemaker transmission.   

## 2021-08-04 ENCOUNTER — Encounter: Payer: Self-pay | Admitting: *Deleted

## 2021-08-06 ENCOUNTER — Encounter: Payer: Medicare PPO | Admitting: Cardiovascular Disease

## 2021-08-15 ENCOUNTER — Other Ambulatory Visit: Payer: Self-pay | Admitting: Cardiovascular Disease

## 2021-08-16 NOTE — Telephone Encounter (Signed)
Prescription refill request for Eliquis received. Indication:atrial fib Last office visit: Needs Visit Scr:1.2 Age: 87 Weight:78.7 kg  Prescription refilled

## 2021-09-12 ENCOUNTER — Ambulatory Visit (INDEPENDENT_AMBULATORY_CARE_PROVIDER_SITE_OTHER): Payer: Medicare PPO

## 2021-09-12 DIAGNOSIS — I495 Sick sinus syndrome: Secondary | ICD-10-CM

## 2021-09-17 ENCOUNTER — Telehealth: Payer: Self-pay | Admitting: Cardiovascular Disease

## 2021-09-17 NOTE — Telephone Encounter (Signed)
  Pt wanted to let [name] know he is sending a manual transmission of his pacemaker today around 12 noon

## 2021-09-18 ENCOUNTER — Other Ambulatory Visit: Payer: Self-pay | Admitting: Cardiovascular Disease

## 2021-09-18 LAB — CUP PACEART REMOTE DEVICE CHECK
Battery Impedance: 602 Ohm
Battery Remaining Longevity: 81 mo
Battery Voltage: 2.78 V
Brady Statistic AP VP Percent: 0 %
Brady Statistic AP VS Percent: 88 %
Brady Statistic AS VP Percent: 0 %
Brady Statistic AS VS Percent: 12 %
Date Time Interrogation Session: 20221107143002
Implantable Lead Implant Date: 20071115
Implantable Lead Implant Date: 20071115
Implantable Lead Location: 753859
Implantable Lead Location: 753860
Implantable Lead Model: 4092
Implantable Lead Model: 5076
Implantable Pulse Generator Implant Date: 20150929
Lead Channel Impedance Value: 500 Ohm
Lead Channel Impedance Value: 600 Ohm
Lead Channel Pacing Threshold Amplitude: 0.5 V
Lead Channel Pacing Threshold Amplitude: 0.5 V
Lead Channel Pacing Threshold Pulse Width: 0.4 ms
Lead Channel Pacing Threshold Pulse Width: 0.4 ms
Lead Channel Setting Pacing Amplitude: 2 V
Lead Channel Setting Pacing Amplitude: 2.5 V
Lead Channel Setting Pacing Pulse Width: 0.4 ms
Lead Channel Setting Sensing Sensitivity: 5.6 mV

## 2021-09-18 NOTE — Telephone Encounter (Signed)
Scheduled transmission received and forwardd to MD for review.

## 2021-09-18 NOTE — Progress Notes (Signed)
Remote pacemaker transmission.   

## 2021-09-24 ENCOUNTER — Ambulatory Visit (INDEPENDENT_AMBULATORY_CARE_PROVIDER_SITE_OTHER): Payer: Medicare PPO | Admitting: Cardiovascular Disease

## 2021-09-24 ENCOUNTER — Other Ambulatory Visit: Payer: Self-pay

## 2021-09-24 VITALS — BP 124/82 | HR 65 | Ht 68.0 in | Wt 171.6 lb

## 2021-09-24 DIAGNOSIS — Z7901 Long term (current) use of anticoagulants: Secondary | ICD-10-CM

## 2021-09-24 DIAGNOSIS — Z950 Presence of cardiac pacemaker: Secondary | ICD-10-CM | POA: Diagnosis not present

## 2021-09-24 DIAGNOSIS — I480 Paroxysmal atrial fibrillation: Secondary | ICD-10-CM

## 2021-09-24 DIAGNOSIS — I25810 Atherosclerosis of coronary artery bypass graft(s) without angina pectoris: Secondary | ICD-10-CM

## 2021-09-24 DIAGNOSIS — I10 Essential (primary) hypertension: Secondary | ICD-10-CM

## 2021-09-24 DIAGNOSIS — E7800 Pure hypercholesterolemia, unspecified: Secondary | ICD-10-CM

## 2021-09-24 DIAGNOSIS — I495 Sick sinus syndrome: Secondary | ICD-10-CM | POA: Diagnosis not present

## 2021-09-24 LAB — LIPID PANEL
Chol/HDL Ratio: 3.1 ratio (ref 0.0–5.0)
Cholesterol, Total: 162 mg/dL (ref 100–199)
HDL: 52 mg/dL (ref >39)
LDL Chol Calc (NIH): 93 mg/dL (ref 0–99)
Triglycerides: 89 mg/dL (ref 0–149)
VLDL Cholesterol Cal: 17 mg/dL (ref 5–40)

## 2021-09-24 LAB — COMPREHENSIVE METABOLIC PANEL
ALT: 14 IU/L (ref 0–44)
AST: 24 IU/L (ref 0–40)
Albumin/Globulin Ratio: 1.9 (ref 1.2–2.2)
Albumin: 4.3 g/dL (ref 3.6–4.6)
Alkaline Phosphatase: 88 IU/L (ref 44–121)
BUN/Creatinine Ratio: 11 (ref 10–24)
BUN: 12 mg/dL (ref 8–27)
Bilirubin Total: 0.6 mg/dL (ref 0.0–1.2)
CO2: 25 mmol/L (ref 20–29)
Calcium: 9.1 mg/dL (ref 8.6–10.2)
Chloride: 102 mmol/L (ref 96–106)
Creatinine, Ser: 1.06 mg/dL (ref 0.76–1.27)
Globulin, Total: 2.3 g/dL (ref 1.5–4.5)
Glucose: 88 mg/dL (ref 70–99)
Potassium: 4.6 mmol/L (ref 3.5–5.2)
Sodium: 140 mmol/L (ref 134–144)
Total Protein: 6.6 g/dL (ref 6.0–8.5)
eGFR: 68 mL/min/{1.73_m2} (ref >59)

## 2021-09-24 NOTE — Progress Notes (Signed)
Cardiology Office Note    Date:  09/25/2021   ID:  [name], DOB Apr 25, 1933, MRN [id number]  PCP:  Ian Hire, MD  Cardiologist:   Sanda Klein, MD   Chief Complaint  Patient presents with   Coronary Artery Disease   Pacemaker Check    History of Present Illness:  [name] is a 87 y.o. male with CAD s/p CABG, sinus node dysfunction and paroxysmal atrial fibrillation, status post dual-chamber permanent pacemaker implantation in 2007 (generator change 2015, Medtronic Adapta) returning for follow-up.   He continues to do well and is playing tennis, although he is cut back to schedule a little bit.  During one of his tennis game he suddenly experienced rapid palpitations and had to stop.  He has played tennis again since without any complaints.  He has no cardiovascular complaints. The patient specifically denies any chest pain at rest exertion, dyspnea at rest or with exertion, orthopnea, paroxysmal nocturnal dyspnea, syncope, focal neurological deficits, intermittent claudication, lower extremity edema, unexplained weight gain, cough, hemoptysis or wheezing.  He walks his beagle daily, for a couple of miles.  Interrogation of his pacemaker shows normal device function with estimated generator longevity of 6.5 years. There is 88 % atrial pacing and <1% ventricular pacing.  The burden of atrial fibrillation is only about 1%.  The most recent meaningful episode of atrial fibrillation occurred in August and lasted for about 13 hours.  Ventricular rates are in the 60s and 70s during atrial fibrillation.  He has occasional brief episodes of paroxysmal atrial tachycardia with 1: 1 AV conduction that have fast ventricular rates, but these are very brief and asymptomatic.  There are couple of episodes of high ventricular rate that seem to be too fast to be physiological and likely represent brief "noise" on the ventricular channel.  He has not had any true ventricular tachycardia.  He  was poorly tolerant of rosuvastatin due to myopathy symptoms when he was taken on a daily basis.  But taking it every other day is well-tolerated.  Normal left ventricular systolic function by echo most recently performed in 2014. Last nuclear stress test in 2012 was normal. He underwent bypass surgery in 2007 (Dr. Cyndia Bent 2007, LIMA to LAD, SVG to OM, SVG to RCA). His pacemaker is a Medtronic Adapta dual-chamber device implanted in 2015 (leads from 2007). He has treated hyperlipidemia, followed by his primary care physician. He has mild thrombocytopenia without bleeding complications.  Past Medical History:  Diagnosis Date   Arthritis    Hyperlipidemia    Hypertension    Hypothyroidism    Paroxysmal atrial fibrillation ([place]) 11/22/2014   Thrombocytopenia ([place]) 05/23/2015    Past Surgical History:  Procedure Laterality Date   bilateral carotid duplex ultrasound showed no ICA     stenosis   CARDIAC CATHETERIZATION     Dr. Tami Ribas which showed a 95% ostial LAD stenosis   CORONARY ARTERY BYPASS GRAFT     x3 using a left internal mammary artery   PACEMAKER GENERATOR CHANGE N/A 08/09/2014   Procedure: PACEMAKER GENERATOR CHANGE [facility] Spine;  Surgeon: Sanda Klein, MD;  Location: [facility];  Service: Cardiovascular;  Laterality: N/A;   Placement of permanent pacemaker     US ECHOCARDIOGRAPHY     lft ventricular ejection fraction was about 60%    Current Medications: Outpatient Medications Prior to Visit  Medication Sig Dispense Refill   apixaban (ELIQUIS) 5 MG TABS tablet TAKE ONE (1) TABLET BY MOUTH TWO  TIMES PER DAY 60 tablet 0   liothyronine (CYTOMEL) 5 MCG tablet Take 5 mcg by mouth daily.     metoprolol succinate (TOPROL-XL) 25 MG 24 hr tablet TAKE ONE TABLET BY MOUTH EVERY DAY 90 tablet 3   rosuvastatin (CRESTOR) 10 MG tablet TAKE ONE TABLET BY MOUTH EVERY OTHER DAY 90 tablet 3   SYNTHROID 100 MCG tablet Take 100 mg by mouth daily.     No facility-administered medications prior to  visit.     Allergies:   Latex and Sulfa antibiotics   Social History   Socioeconomic History   Marital status: Married    Spouse name: Not on file   Number of children: Not on file   Years of education: Not on file   Highest education level: Not on file  Occupational History   Not on file  Tobacco Use   Smoking status: Never   Smokeless tobacco: Never  Substance and Sexual Activity   Alcohol use: Yes    Comment: occas.   Drug use: No   Sexual activity: Not on file  Other Topics Concern   Not on file  Social History Narrative   Not on file   Social Determinants of Health   Financial Resource Strain: Not on file  Food Insecurity: Not on file  Transportation Needs: Not on file  Physical Activity: Not on file  Stress: Not on file  Social Connections: Not on file     Family History:  The patient's family history includes Hypertension in his mother; Stroke in his mother.   ROS:   Please see the history of present illness.    ROS All other systems are reviewed and are negative.  PHYSICAL EXAM:   VS:  BP 124/82 (BP Location: Left Arm, Patient Position: Sitting, Cuff Size: Normal)   Pulse 65   Ht 5' 8"  (1.727 m)   Wt 171 lb 9.6 oz (77.8 kg)   SpO2 99%   BMI 26.09 kg/m      General: Alert, oriented x3, no distress, appears younger than stated age, fit Head: no evidence of trauma, PERRL, EOMI, no exophtalmos or lid lag, no myxedema, no xanthelasma; normal ears, nose and oropharynx Neck: normal jugular venous pulsations and no hepatojugular reflux; brisk carotid pulses without delay and no carotid bruits Chest: clear to auscultation, no signs of consolidation by percussion or palpation, normal fremitus, symmetrical and full respiratory excursions Cardiovascular: normal position and quality of the apical impulse, regular rhythm, normal first and second heart sounds, no murmurs, rubs or gallops Abdomen: no tenderness or distention, no masses by palpation, no abnormal  pulsatility or arterial bruits, normal bowel sounds, no hepatosplenomegaly Extremities: no clubbing, cyanosis or edema; 2+ radial, ulnar and brachial pulses bilaterally; 2+ right femoral, posterior tibial and dorsalis pedis pulses; 2+ left femoral, posterior tibial and dorsalis pedis pulses; no subclavian or femoral bruits Neurological: grossly nonfocal Psych: Normal mood and affect    Wt Readings from Last 3 Encounters:  09/24/21 171 lb 9.6 oz (77.8 kg)  07/31/20 173 lb 6.4 oz (78.7 kg)  07/22/19 163 lb 3.2 oz (74 kg)      Studies/Labs Reviewed:   EKG:  EKG is ordered today.  It shows atrial paced, ventricular sensed rhythm with a long AV delay of 254 ms but it is otherwise normal.  The QTC is 430 ms  Recent Labs: Labs from November 30, 2019 show total cholesterol 152, triglycerides 57, HDL 51, LDL 90 Labs from May 31, 2020 show  potassium 5.0, creatinine 1.2, glucose 100, TSH 4.789, hemoglobin 14.5  Lipid Panel     Component Value Date/Time   CHOL 162 09/24/2021 0845   TRIG 89 09/24/2021 0845   HDL 52 09/24/2021 0845   CHOLHDL 3.1 09/24/2021 0845   LDLCALC 93 09/24/2021 0845   LABVLDL 17 09/24/2021 0845   BMET    Component Value Date/Time   NA 140 09/24/2021 0845   K 4.6 09/24/2021 0845   CL 102 09/24/2021 0845   CO2 25 09/24/2021 0845   GLUCOSE 88 09/24/2021 0845   GLUCOSE 92 01/13/2010 0628   BUN 12 09/24/2021 0845   CREATININE 1.06 09/24/2021 0845   CALCIUM 9.1 09/24/2021 0845   EGFR 68 09/24/2021 0845   GFRNONAA 55 (L) 12/09/2017 0901    ASSESSMENT:    1. Paroxysmal atrial fibrillation (HCC)   2. Long term current use of anticoagulant   3. SSS (sick sinus syndrome) ([place])   4. Pacemaker   5. Coronary artery disease involving coronary bypass graft of native heart without angina pectoris   6. Essential hypertension   7. Hypercholesterolemia      PLAN:  In order of problems listed above:  AFib: Has a low burden of atrial fibrillation with good  ventricular rate control.  I suspect that his episode of palpitations may have been due to one of his spells of atrial tachycardia with 1: 1 AV conduction, not due to atrial fibrillation.  This has happened very infrequently I do not think it requires additional treatment unless his symptoms become more prevalent.Marland Kitchen  CHADSVasc 4 (age 28, HTN, CAD).  Eliquis: Denies falls, serious injuries or bleeding problems SSS: He has virtually 90% atrial paced rhythm but his heart rate histogram appears appropriate.  He does not have symptoms of chronotropic incompetence. PPM: Normal device function.  He has to go to an Internet store to do his downloads, since he has poor reception at home. CAD: It has been 15 years since his bypass procedure.  He remains physically active without angina pectoris. HTN: Well-controlled. HLP: LDL is not at target of 70 or less.  He does not do well when he takes it daily.  We will try to see if he can tolerate 20 mg every other day.   Medication Adjustments/Labs and Tests Ordered: Current medicines are reviewed at length with the patient today.  Concerns regarding medicines are outlined above.  Medication changes, Labs and Tests ordered today are listed in the Patient Instructions below. Patient Instructions  Medication Instructions:  No changes *If you need a refill on your cardiac medications before your next appointment, please call your pharmacy*   Lab Work: Your provider would like for you to have the following labs today: Lipid and CMET  If you have labs (blood work) drawn today and your tests are completely normal, you will receive your results only by: [place] (if you have MyChart) OR A paper copy in the mail If you have any lab test that is abnormal or we need to change your treatment, we will call you to review the results.   Testing/Procedures: None ordered   Follow-Up: At [facility], you and your health needs are our priority.  As part of our  continuing mission to provide you with exceptional heart care, we have created designated Provider Care Teams.  These Care Teams include your primary Cardiologist (physician) and Advanced Practice Providers (APPs -  Physician Assistants and Nurse Practitioners) who all work together to provide you with  the care you need, when you need it.  We recommend signing up for the patient portal called "MyChart".  Sign up information is provided on this After Visit Summary.  MyChart is used to connect with patients for Virtual Visits (Telemedicine).  Patients are able to view lab/test results, encounter notes, upcoming appointments, etc.  Non-urgent messages can be sent to your provider as well.   To learn more about what you can do with MyChart, go to [email or web address].    Your next appointment:   12 month(s)  The format for your next appointment:   In Person  Provider:   Sanda Klein, MD    Signed, Sanda Klein, MD  09/25/2021 6:58 PM    [address], [place], [place]  [address] Phone: [phone]; Fax: [phone]

## 2021-09-24 NOTE — Patient Instructions (Addendum)
Medication Instructions:  °No changes °*If you need a refill on your cardiac medications before your next appointment, please call your pharmacy* ° ° °Lab Work: °Your provider would like for you to have the following labs today: Lipid and CMET ° °If you have labs (blood work) drawn today and your tests are completely normal, you will receive your results only by: °MyChart Message (if you have MyChart) OR °A paper copy in the mail °If you have any lab test that is abnormal or we need to change your treatment, we will call you to review the results. ° ° °Testing/Procedures: °None ordered ° ° °Follow-Up: °At [facility], you and your health needs are our priority.  As part of our continuing mission to provide you with exceptional heart care, we have created designated Provider Care Teams.  These Care Teams include your primary Cardiologist (physician) and Advanced Practice Providers (APPs -  Physician Assistants and Nurse Practitioners) who all work together to provide you with the care you need, when you need it. ° °We recommend signing up for the patient portal called "MyChart".  Sign up information is provided on this After Visit Summary.  MyChart is used to connect with patients for Virtual Visits (Telemedicine).  Patients are able to view lab/test results, encounter notes, upcoming appointments, etc.  Non-urgent messages can be sent to your provider as well.   °To learn more about what you can do with MyChart, go to [email or web address].   ° °Your next appointment:   °12 month(s) ° °The format for your next appointment:   °In Person ° °Provider:   °Mihai Croitoru, MD   ° ° °

## 2021-09-25 ENCOUNTER — Encounter: Payer: Self-pay | Admitting: Cardiovascular Disease

## 2021-10-01 ENCOUNTER — Other Ambulatory Visit: Payer: Self-pay | Admitting: *Deleted

## 2021-10-01 MED ORDER — ROSUVASTATIN CALCIUM 10 MG PO TABS: 10.0000 mg | ORAL_TABLET | Freq: Every day | ORAL | 3 refills | Status: DC

## 2021-10-02 ENCOUNTER — Other Ambulatory Visit
Admission: RE | Admit: 2021-10-02 | Discharge: 2021-10-02 | Disposition: A | Discharge status: Home / Self Care | Payer: Medicare PPO | Attending: Ophthalmology | Admitting: Ophthalmology

## 2021-10-02 DIAGNOSIS — H532 Diplopia: Secondary | ICD-10-CM | POA: Diagnosis present

## 2021-10-02 DIAGNOSIS — R519 Headache, unspecified: Secondary | ICD-10-CM | POA: Diagnosis not present

## 2021-10-02 LAB — CBC
HCT: 44.1 % (ref 39.0–52.0)
Hemoglobin: 14.7 g/dL (ref 13.0–17.0)
MCH: 31.1 pg (ref 26.0–34.0)
MCHC: 33.3 g/dL (ref 30.0–36.0)
MCV: 93.2 fL (ref 80.0–100.0)
Platelets: 139 10*3/uL — ABNORMAL LOW (ref 150–400)
RBC: 4.73 MIL/uL (ref 4.22–5.81)
RDW: 12.6 % (ref 11.5–15.5)
WBC: 6.2 10*3/uL (ref 4.0–10.5)
nRBC: 0 % (ref 0.0–0.2)

## 2021-10-02 LAB — C-REACTIVE PROTEIN: CRP: 0.6 mg/dL (ref <1.0)

## 2021-10-02 LAB — SEDIMENTATION RATE: Sed Rate: 13 mm/hr (ref 0–20)

## 2021-10-17 ENCOUNTER — Other Ambulatory Visit: Payer: Self-pay | Admitting: Cardiovascular Disease

## 2021-10-18 NOTE — Telephone Encounter (Signed)
Prescription refill request for Eliquis received. Indication:Afib Last office visit:11/22 Scr:1.0 Age: 87 Weight:77.8 kg  Prescription refilled

## 2021-10-29 ENCOUNTER — Telehealth: Payer: Self-pay | Admitting: Cardiovascular Disease

## 2021-10-29 NOTE — Telephone Encounter (Signed)
Patient c/o Palpitations:  High priority if patient c/o lightheadedness, shortness of breath, or chest pain  How long have you had palpitations/irregular HR/ Afib? Are you having the symptoms now? Last 4-5 days, yes  Are you currently experiencing lightheadedness, SOB or CP? no  Do you have a history of afib (atrial fibrillation) or irregular heart rhythm? yes  Have you checked your BP or HR? (document readings if available): BP 149/84 HR 84  Are you experiencing any other symptoms? no   Patient's wife states they think the patient may be in afib. She says he has not felt well for the last 4-5 days. She says he feels weak and "just feels bad". She states he does not have any other symptoms.

## 2021-10-29 NOTE — Telephone Encounter (Signed)
Spoke with [name], he reports for the last couple days he is going in and out of what he thinks is atrial fib. He generally feels fine but occasionally he will get a little dizzy. He reports first thing in the morning prior to taking his metoprolol it will bother him the most. Bp 149/87 84 bpm. He reports the episodes will last about 1 hour and he deep breathes and holds his breath and that seems to help it. There has been no change in his health except 3 weeks ago he was having double vision and his glasses were changed and the has helped. Explained to the patient we need to see what has been going on and he will go down the street and send a transmission. He said we would have it within the hour. Aware will forward to dr croitoru to review and advise.

## 2021-10-30 NOTE — Telephone Encounter (Signed)
Transmission received. See below

## 2021-10-30 NOTE — Telephone Encounter (Signed)
° °  Pt is calling to f/u, he is travelling today. He requested for the nurse to call him back at [phone]

## 2021-12-12 ENCOUNTER — Ambulatory Visit (INDEPENDENT_AMBULATORY_CARE_PROVIDER_SITE_OTHER): Payer: Medicare PPO

## 2021-12-12 DIAGNOSIS — I480 Paroxysmal atrial fibrillation: Secondary | ICD-10-CM

## 2021-12-12 LAB — CUP PACEART REMOTE DEVICE CHECK
Battery Impedance: 703 Ohm
Battery Remaining Longevity: 76 mo
Battery Voltage: 2.78 V
Brady Statistic AP VP Percent: 1 %
Brady Statistic AP VS Percent: 85 %
Brady Statistic AS VP Percent: 0 %
Brady Statistic AS VS Percent: 14 %
Date Time Interrogation Session: 20230201150540
Implantable Lead Implant Date: 20071115
Implantable Lead Implant Date: 20071115
Implantable Lead Location: 753859
Implantable Lead Location: 753860
Implantable Lead Model: 4092
Implantable Lead Model: 5076
Implantable Pulse Generator Implant Date: 20150929
Lead Channel Impedance Value: 508 Ohm
Lead Channel Impedance Value: 612 Ohm
Lead Channel Pacing Threshold Amplitude: 0.5 V
Lead Channel Pacing Threshold Amplitude: 0.625 V
Lead Channel Pacing Threshold Pulse Width: 0.4 ms
Lead Channel Pacing Threshold Pulse Width: 0.4 ms
Lead Channel Setting Pacing Amplitude: 2 V
Lead Channel Setting Pacing Amplitude: 2.5 V
Lead Channel Setting Pacing Pulse Width: 0.4 ms
Lead Channel Setting Sensing Sensitivity: 5.6 mV

## 2021-12-19 NOTE — Progress Notes (Signed)
Remote pacemaker transmission.   

## 2022-01-10 IMAGING — CT CT ORBITS W/O CM
3 of 4 series · 13 of 47 positions shown, 15 images · non-contrast
Comparison: No pertinent prior exam.

CLINICAL DATA: Pressure behind left eye for 3 weeks

EXAM:
CT ORBITS WITHOUT CONTRAST
TECHNIQUE: Multidetector CT imaging of the orbits was performed using the
standard protocol without intravenous contrast. Multiplanar CT image
reconstructions were also generated.

[Series 3: orbits soft orbits 2.00 · axial · 0.35mm/px · z∈[-524,-434]mm · 8 of 53 slices shown, 10 images]
[im 4/53  brain]
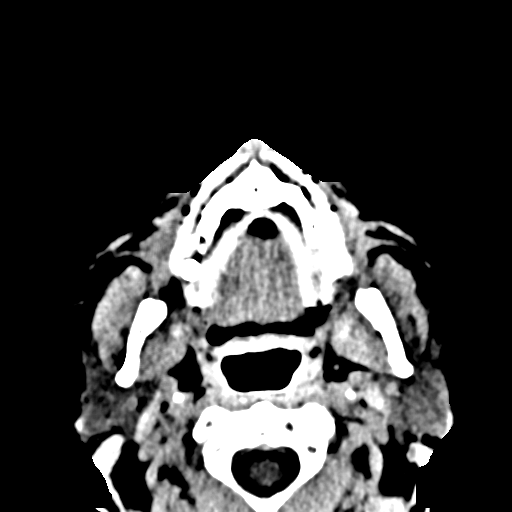
[im 4/53  bone]
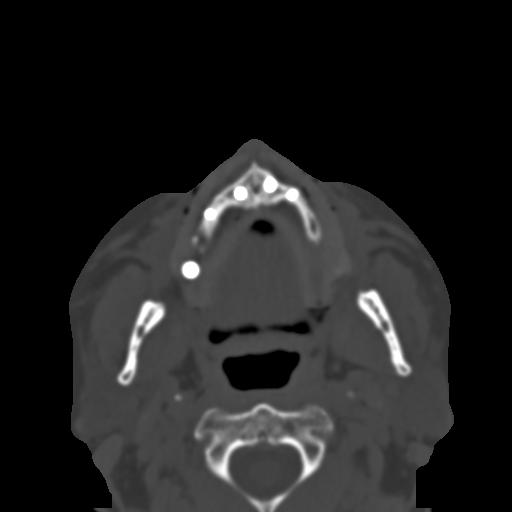
[im 11/53  bone]
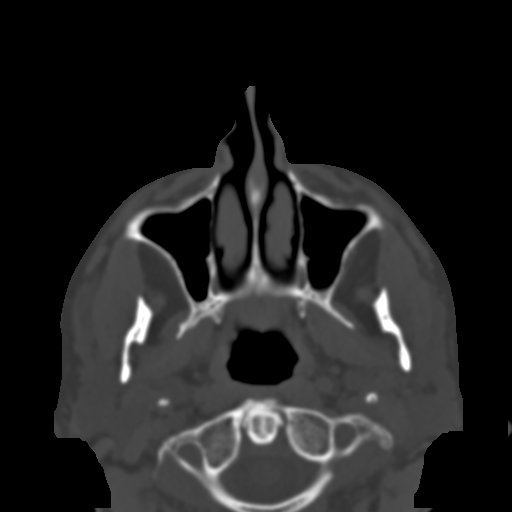
[im 17/53  bone]
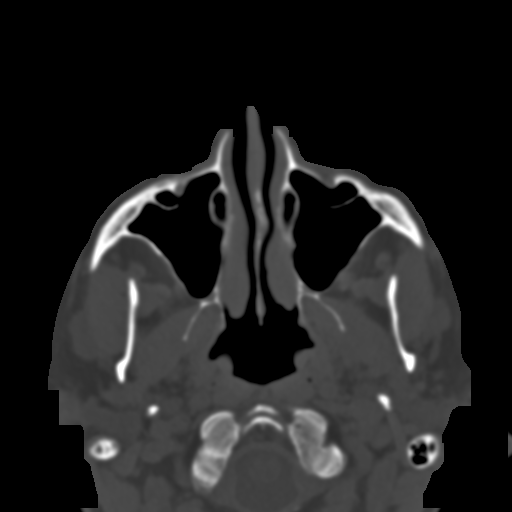
[im 24/53  bone]
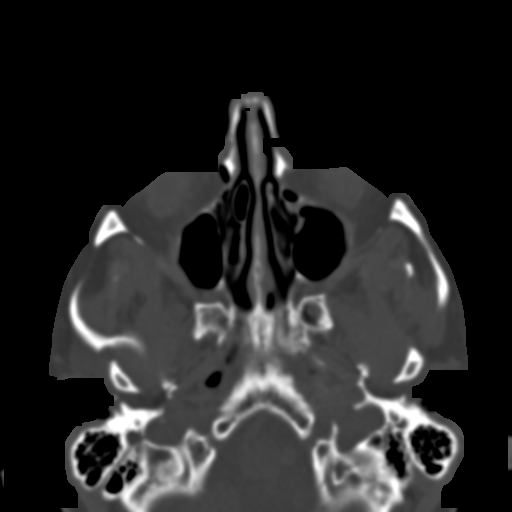
[im 29/53  brain]
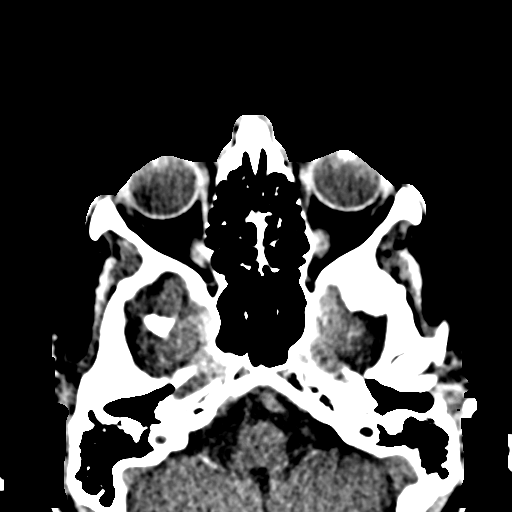
[im 29/53  bone]
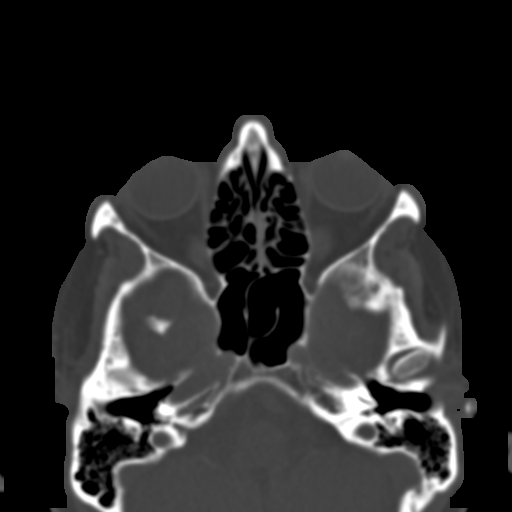
[im 36/53  bone]
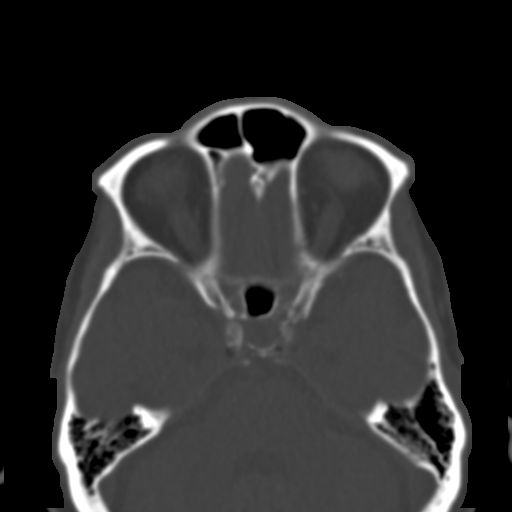
[im 42/53  bone]
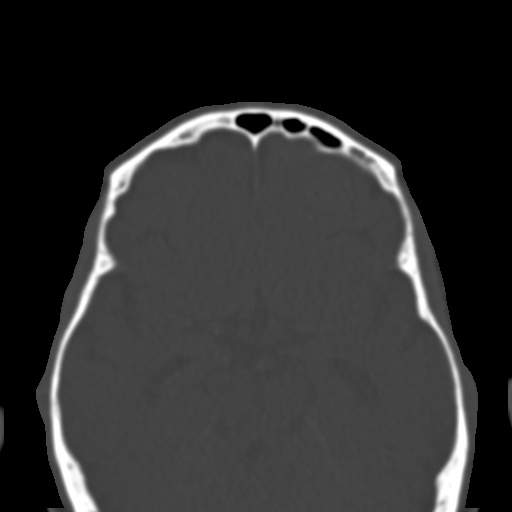
[im 49/53  bone]
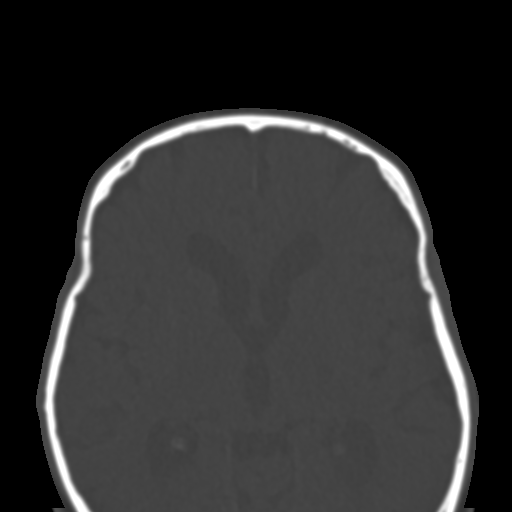

[Series 4: cor soft orbits 2.00 cor · coronal · 0.21mm/px · 3 of 89 slices shown]
[im 30/89  bone]
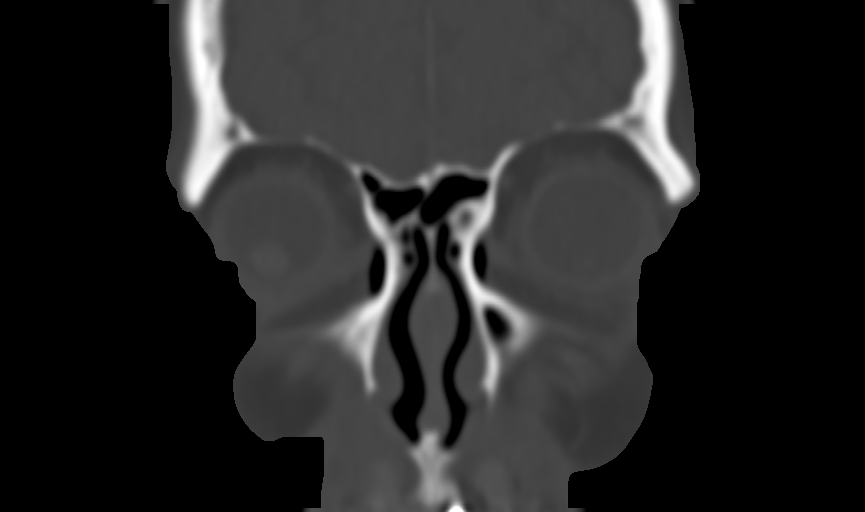
[im 40/89  bone]
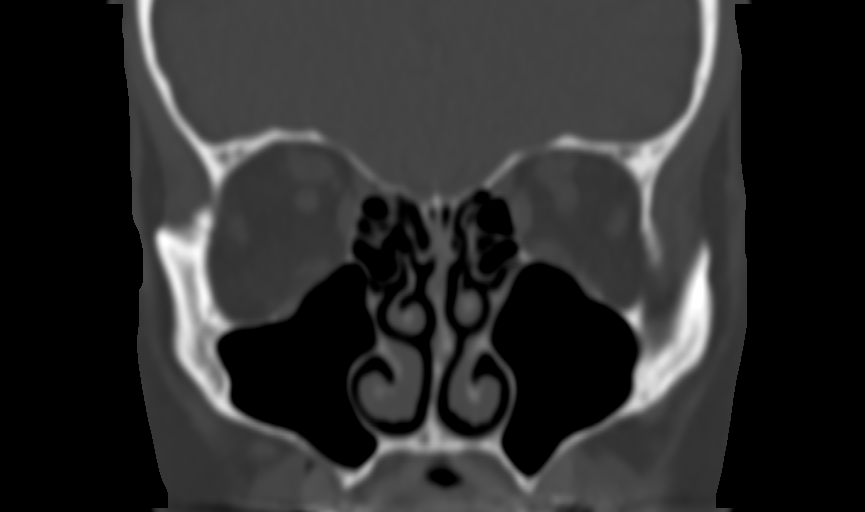
[im 49/89  bone]
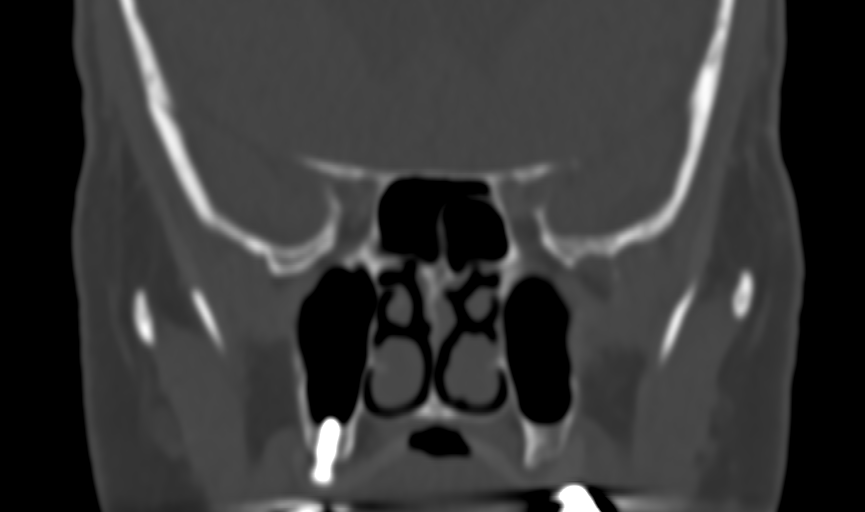

[Series 10: sag soft orbits 2.00 sag · sagittal · 0.21mm/px · 2 of 89 slices shown]
[im 30/89  bone]
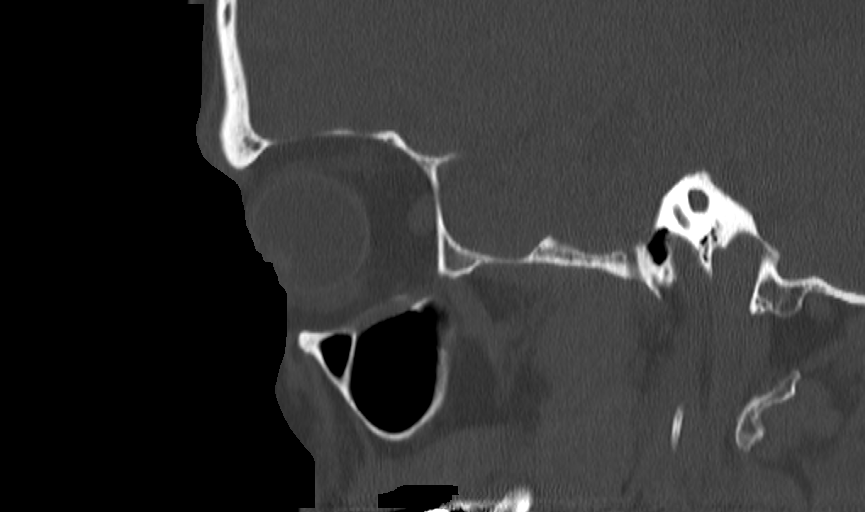
[im 59/89  bone]
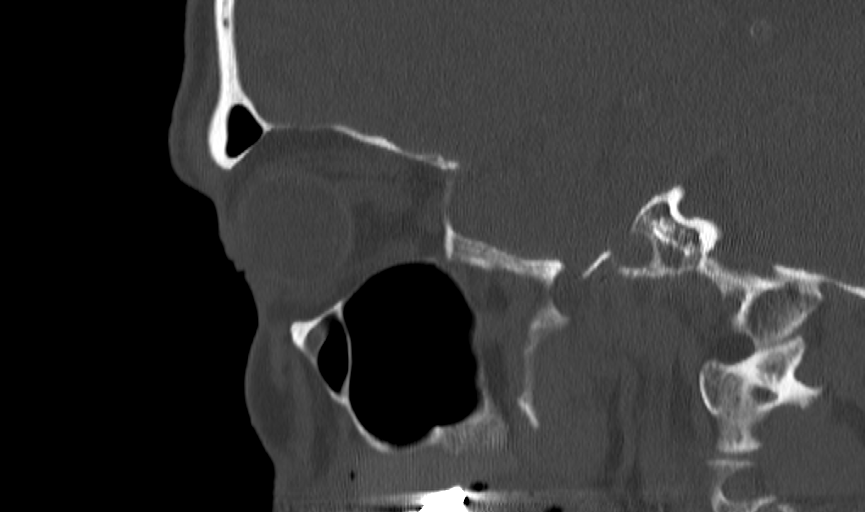

[13 of 47 positions shown; findings below may reference images not displayed]

FINDINGS: Orbits: No orbital mass or evidence of inflammation. Normal
appearance of the globes, optic nerve-sheath complexes, extraocular
muscles, orbital fat and lacrimal glands.

Visible paranasal sinuses: Clear

Soft tissues: Negative

Osseous: No fracture or aggressive lesion.

Limited intracranial: No acute or significant finding.
IMPRESSION: Unremarkable study.

## 2022-03-13 ENCOUNTER — Ambulatory Visit (INDEPENDENT_AMBULATORY_CARE_PROVIDER_SITE_OTHER): Payer: Medicare PPO

## 2022-03-13 DIAGNOSIS — I495 Sick sinus syndrome: Secondary | ICD-10-CM

## 2022-03-14 LAB — CUP PACEART REMOTE DEVICE CHECK
Battery Impedance: 754 Ohm
Battery Remaining Longevity: 73 mo
Battery Voltage: 2.78 V
Brady Statistic AP VP Percent: 0 %
Brady Statistic AP VS Percent: 83 %
Brady Statistic AS VP Percent: 0 %
Brady Statistic AS VS Percent: 17 %
Date Time Interrogation Session: 20230504114324
Implantable Lead Implant Date: 20071115
Implantable Lead Implant Date: 20071115
Implantable Lead Location: 753859
Implantable Lead Location: 753860
Implantable Lead Model: 4092
Implantable Lead Model: 5076
Implantable Pulse Generator Implant Date: 20150929
Lead Channel Impedance Value: 500 Ohm
Lead Channel Impedance Value: 610 Ohm
Lead Channel Pacing Threshold Amplitude: 0.5 V
Lead Channel Pacing Threshold Amplitude: 0.5 V
Lead Channel Pacing Threshold Pulse Width: 0.4 ms
Lead Channel Pacing Threshold Pulse Width: 0.4 ms
Lead Channel Setting Pacing Amplitude: 2 V
Lead Channel Setting Pacing Amplitude: 2.5 V
Lead Channel Setting Pacing Pulse Width: 0.4 ms
Lead Channel Setting Sensing Sensitivity: 5.6 mV

## 2022-03-15 ENCOUNTER — Other Ambulatory Visit: Payer: Self-pay | Admitting: Cardiovascular Disease

## 2022-03-15 NOTE — Telephone Encounter (Signed)
Prescription refill request for Eliquis received. ?Indication:Afib ?Last office visit:11/22 ?Scr:1.1 ?Age: 88 ?Weight:77.8 kg ? ?Prescription refilled ? ?

## 2022-03-27 NOTE — Progress Notes (Signed)
Remote pacemaker transmission.   

## 2022-04-16 ENCOUNTER — Telehealth: Payer: Self-pay | Admitting: Cardiovascular Disease

## 2022-04-16 NOTE — Telephone Encounter (Signed)
Let's wait for visit before changing meds.

## 2022-04-16 NOTE — Telephone Encounter (Signed)
Pt c/o BP issue: STAT if pt c/o blurred vision, one-sided weakness or slurred speech  1. What are your last 5 BP readings? 148/84 147/80  154/87- this is high for him and this is after he takes his medicine  2. Are you having any other symptoms (ex. Dizziness, headache, blurred vision, passed out)?  having  some Afib. And feeling real tired Real tired  3. What is your BP issue? Blood pressure is running high- patient wanted an appointment- made him an appointment for Friday(04-19-22 with Dr Salena Saner

## 2022-04-16 NOTE — Telephone Encounter (Signed)
Returned call to patient of Dr. Salena Saner re: AFib, elevated BP Patient reports he sometimes does not notice his AFib but sometimes is much more noticeable/fatigued.  He takes metoprolol succinate 25mg  daily and eliquis 5mg  BID He reports elevated BP for a few weeks - 140s systolic AFTER meds  He is scheduled for appointment on 6/9 with Croitoru MD Routed to him as FYI or for any med changes/advice before visit

## 2022-04-19 ENCOUNTER — Ambulatory Visit: Payer: Medicare PPO | Admitting: Cardiovascular Disease

## 2022-04-19 ENCOUNTER — Telehealth (HOSPITAL_COMMUNITY): Payer: Self-pay | Admitting: *Deleted

## 2022-04-19 ENCOUNTER — Encounter: Payer: Self-pay | Admitting: Cardiovascular Disease

## 2022-04-19 VITALS — BP 132/72 | HR 66 | Ht 68.0 in | Wt 164.8 lb

## 2022-04-19 DIAGNOSIS — I25708 Atherosclerosis of coronary artery bypass graft(s), unspecified, with other forms of angina pectoris: Secondary | ICD-10-CM | POA: Diagnosis not present

## 2022-04-19 DIAGNOSIS — E7800 Pure hypercholesterolemia, unspecified: Secondary | ICD-10-CM

## 2022-04-19 DIAGNOSIS — D6869 Other thrombophilia: Secondary | ICD-10-CM | POA: Diagnosis not present

## 2022-04-19 DIAGNOSIS — W57XXXA Bitten or stung by nonvenomous insect and other nonvenomous arthropods, initial encounter: Secondary | ICD-10-CM

## 2022-04-19 DIAGNOSIS — S80862A Insect bite (nonvenomous), left lower leg, initial encounter: Secondary | ICD-10-CM

## 2022-04-19 DIAGNOSIS — I10 Essential (primary) hypertension: Secondary | ICD-10-CM

## 2022-04-19 DIAGNOSIS — E039 Hypothyroidism, unspecified: Secondary | ICD-10-CM

## 2022-04-19 DIAGNOSIS — I495 Sick sinus syndrome: Secondary | ICD-10-CM | POA: Diagnosis not present

## 2022-04-19 DIAGNOSIS — I480 Paroxysmal atrial fibrillation: Secondary | ICD-10-CM

## 2022-04-19 DIAGNOSIS — Z950 Presence of cardiac pacemaker: Secondary | ICD-10-CM

## 2022-04-19 NOTE — Progress Notes (Addendum)
Cardiology Office Note    Date:  04/19/2022   ID:  [name], DOB 04/07/1933, MRN [id number]  PCP:  Baxter Hire, MD  Cardiologist:   Sanda Klein, MD   Chief Complaint  Patient presents with   Fatigue    History of Present Illness:  [name] is a 88 y.o. male with CAD s/p CABG, sinus node dysfunction and paroxysmal atrial fibrillation, status post dual-chamber permanent pacemaker implantation in 2007 (generator change 2015, Medtronic Adapta) returning for follow-up.   He is not feeling as well as he did a year ago.  He has stopped playing tennis because he kept developing tachycardia and fatigue during his matches and had to stop.  He last played tennis about 6 months ago.  For the last 3 months or so, he has been feeling intermittently "washed out".  Some days are better than others, but at times he simply sits in a chair all day long, which worries his wife.  He sleeps more than usual.  The days that he feels lethargic are becoming more frequent.  He had a couple of tick bites about 3 weeks ago, but did not develop a rash at either site.  Last night he had a temperature to 100.8 F, took an ibuprofen and drenched the bed sheets overnight.  He feels a little better this morning and his temperature is normal.  It's been about 16 years since he underwent bypass surgery.  It is important to note that he had no angina pectoris before bypass.  He experienced fatigue and dyspnea.  He uses exactly the same terminology saying that he felt "washed out.  This led to an EKG, subsequent stress testing and eventually bypass surgery.  Comprehensive pacemaker check performed today shows very similar findings.  Atrial pacing occurs 83% of the time and there is almost never ventricular pacing (0.5%).  The burden of atrial fibrillation is similar at 2%.  The last episode of atrial fibrillation occurred on 04/15/2022 for about 45 minutes and was apparently asymptomatic.  He continues to have  occasional episodes of high ventricular rate.  One very brief episode is too fast to be physiological and is consistent with "noise" on the ventricular channel, which was seen a few times in the past.  Most of these appear to be sinus tachycardia because of their gradual onset and acceleration, reaching just above the detection limit.  The heart rate histogram distribution appears appropriate.  All lead parameters are normal and estimated generator longevity is about 6 years.  He was poorly tolerant of rosuvastatin due to myopathy symptoms when he was taken on a daily basis.  But taking it every other day is well-tolerated.  Normal left ventricular systolic function by echo most recently performed in 2014. Last nuclear stress test in 2012 was normal. He underwent bypass surgery in 2007 (Dr. Cyndia Bent 2007, LIMA to LAD, SVG to OM, SVG to RCA). His pacemaker is a Medtronic Adapta dual-chamber device implanted in 2015 (leads from 2007). He has treated hyperlipidemia, followed by his primary care physician. He has mild thrombocytopenia without bleeding complications.  Past Medical History:  Diagnosis Date   Arthritis    Hyperlipidemia    Hypertension    Hypothyroidism    Paroxysmal atrial fibrillation ([place]) 11/22/2014   Thrombocytopenia ([place]) 05/23/2015    Past Surgical History:  Procedure Laterality Date   bilateral carotid duplex ultrasound showed no ICA     stenosis   CARDIAC CATHETERIZATION  Dr. Tami Ribas which showed a 95% ostial LAD stenosis   CORONARY ARTERY BYPASS GRAFT     x3 using a left internal mammary artery   PACEMAKER GENERATOR CHANGE N/A 08/09/2014   Procedure: PACEMAKER GENERATOR CHANGE [facility];  Surgeon: Sanda Klein, MD;  Location: [facility];  Service: Cardiovascular;  Laterality: N/A;   Placement of permanent pacemaker     US ECHOCARDIOGRAPHY     lft ventricular ejection fraction was about 60%    Current Medications: Outpatient Medications Prior to Visit  Medication  Sig Dispense Refill   ELIQUIS 5 MG TABS tablet TAKE ONE (1) TABLET BY MOUTH TWO TIMES PER DAY 60 tablet 5   liothyronine (CYTOMEL) 5 MCG tablet Take 5 mcg by mouth daily.     metoprolol succinate (TOPROL-XL) 25 MG 24 hr tablet TAKE ONE TABLET BY MOUTH EVERY DAY 90 tablet 3   rosuvastatin (CRESTOR) 10 MG tablet Take 1 tablet (10 mg total) by mouth daily. 90 tablet 3   SYNTHROID 100 MCG tablet Take 100 mg by mouth daily.     No facility-administered medications prior to visit.     Allergies:   Latex and Sulfa antibiotics   Social History   Socioeconomic History   Marital status: Married    Spouse name: Not on file   Number of children: Not on file   Years of education: Not on file   Highest education level: Not on file  Occupational History   Not on file  Tobacco Use   Smoking status: Never   Smokeless tobacco: Never  Substance and Sexual Activity   Alcohol use: Yes    Comment: occas.   Drug use: No   Sexual activity: Not on file  Other Topics Concern   Not on file  Social History Narrative   Not on file   Social Determinants of Health   Financial Resource Strain: Not on file  Food Insecurity: Not on file  Transportation Needs: Not on file  Physical Activity: Not on file  Stress: Not on file  Social Connections: Not on file     Family History:  The patient's family history includes Hypertension in his mother; Stroke in his mother.   ROS:   Please see the history of present illness.    ROS All other systems are reviewed and are negative.  PHYSICAL EXAM:   VS:  BP 132/72   Pulse 66   Ht 5' 8"  (1.727 m)   Wt 164 lb 12.8 oz (74.8 kg)   SpO2 97%   BMI 25.06 kg/m      General: Alert, oriented x3, no distress, appears younger than stated age, fit Head: no evidence of trauma, PERRL, EOMI, no exophtalmos or lid lag, no myxedema, no xanthelasma; normal ears, nose and oropharynx Neck: normal jugular venous pulsations and no hepatojugular reflux; brisk carotid pulses  without delay and no carotid bruits Chest: clear to auscultation, no signs of consolidation by percussion or palpation, normal fremitus, symmetrical and full respiratory excursions Cardiovascular: normal position and quality of the apical impulse, regular rhythm, normal first and second heart sounds, no murmurs, rubs or gallops Abdomen: no tenderness or distention, no masses by palpation, no abnormal pulsatility or arterial bruits, normal bowel sounds, no hepatosplenomegaly Extremities: no clubbing, cyanosis or edema; 2+ radial, ulnar and brachial pulses bilaterally; 2+ right femoral, posterior tibial and dorsalis pedis pulses; 2+ left femoral, posterior tibial and dorsalis pedis pulses; no subclavian or femoral bruits Neurological: grossly nonfocal Psych: Normal mood and affect  Wt Readings from Last 3 Encounters:  04/19/22 164 lb 12.8 oz (74.8 kg)  09/24/21 171 lb 9.6 oz (77.8 kg)  07/31/20 173 lb 6.4 oz (78.7 kg)      Studies/Labs Reviewed:   EKG:  EKG is ordered today.  It shows mostly atrial paced, ventricular sensed rhythm with occasional sinus beats/PACs, long AV delay at about 240 ms.  There are no ischemic repolarization abnormalities.  The QTc is normal at 421 ms. Recent Labs: Labs from November 30, 2019 show total cholesterol 152, triglycerides 57, HDL 51, LDL 90 Labs from May 31, 2020 show potassium 5.0, creatinine 1.2, glucose 100, TSH 4.789, hemoglobin 14.5  Lipid Panel     Component Value Date/Time   CHOL 162 09/24/2021 0845   TRIG 89 09/24/2021 0845   HDL 52 09/24/2021 0845   CHOLHDL 3.1 09/24/2021 0845   LDLCALC 93 09/24/2021 0845   LABVLDL 17 09/24/2021 0845   BMET    Component Value Date/Time   NA 140 09/24/2021 0845   K 4.6 09/24/2021 0845   CL 102 09/24/2021 0845   CO2 25 09/24/2021 0845   GLUCOSE 88 09/24/2021 0845   GLUCOSE 92 01/13/2010 0628   BUN 12 09/24/2021 0845   CREATININE 1.06 09/24/2021 0845   CALCIUM 9.1 09/24/2021 0845   EGFR 68  09/24/2021 0845   GFRNONAA 55 (L) 12/09/2017 0901    ASSESSMENT:    1. Coronary artery disease involving coronary bypass graft of native heart with other forms of angina pectoris (HCC)   2. Paroxysmal atrial fibrillation ([place])   3. Acquired thrombophilia ([place])   4. SSS (sick sinus syndrome) ([place])   5. Pacemaker   6. Essential hypertension   7. Hypercholesterolemia   8. Acquired hypothyroidism   9. Tick bite of left lower leg, initial encounter      PLAN:  In order of problems listed above:  CAD s/p CABG. Fatigue/lethargy: It seems that this was his anginal equivalent when he was initially diagnosed with coronary artery disease.  It's been 16 years since bypass surgery.  His last functional study was a normal nuclear stress test in 2012.  We will schedule him for a Lexiscan Myoview.  We will also schedule for an echocardiogram.  If he has decreased left ventricular systolic function or sizable area of ischemia he should undergo coronary angiography. AFib: The burden of atrial fibrillation appears low.  Ventricular rate control during episodes is acceptable.  There does not appear to be indication for true antiarrhythmics or ablation.  Continue beta-blocker.  Brief episodes of high ventricular rate appear to be due to sinus tachycardia, although I cannot exclude ectopic atrial tachycardia with gradual "warm up".  These episodes are brief and its not clear to me that they are responsible for any of his symptoms.  CHADSVasc 4 (age 76, HTN, CAD).  Eliquis: Denies falls, serious injuries or bleeding problems SSS: Good heart rate histogram distribution with over 80% atrial paced rhythm.  I do not think his symptoms are due to chronotropic incompetence. PPM: Normal device function.  Continue remote downloads every 3 months.  He has to go to an Internet store to do his downloads, since he has poor reception at home. HTN: Well-controlled. HLP: LDL is not at target of 70 or less.  He does not tolerate  daily rosuvastatin.  Plan to recheck a lipid profile and possibly add Zetia. Hypothyroidism: Would recheck a TSH to make sure that his lethargy is not due to insufficient thyroid hormone  supplementation.  He will have labs this afternoon with PCP. Tick bites: Right forearm and left lower leg.  No associated rash.  Had a fever to 100.8 F followed by diaphoresis.  Lethargy predated this possible exposure.  He is seeing his primary care provider and will have labs this afternoon.   Medication Adjustments/Labs and Tests Ordered: Current medicines are reviewed at length with the patient today.  Concerns regarding medicines are outlined above.  Medication changes, Labs and Tests ordered today are listed in the Patient Instructions below. Patient Instructions  Medication Instructions:  No changes *If you need a refill on your cardiac medications before your next appointment, please call your pharmacy*   Lab Work: None ordered If you have labs (blood work) drawn today and your tests are completely normal, you will receive your results only by: [place] (if you have MyChart) OR A paper copy in the mail If you have any lab test that is abnormal or we need to change your treatment, we will call you to review the results.   Testing/Procedures: Your physician has requested that you have an echocardiogram. Echocardiography is a painless test that uses sound waves to create images of your heart. It provides your doctor with information about the size and shape of your heart and how well your heart's chambers and valves are working. You may receive an ultrasound enhancing agent through an IV if needed to better visualize your heart during the echo.This procedure takes approximately one hour. There are no restrictions for this procedure. This will take place at the [address], [address].   Your physician has requested that you have a lexiscan myoview. For further information please visit  [email or web address]. Please follow instruction sheet, as given. This will take place at [facility]  How to prepare for your Myocardial Perfusion Test: Do not eat or drink 3 hours prior to your test, except you may have water. Do not consume products containing caffeine (regular or decaffeinated) 12 hours prior to your test. (ex: coffee, chocolate, sodas, tea). Do bring a list of your current medications with you.  If not listed below, you may take your medications as normal. Do wear comfortable clothes (no dresses or overalls) and walking shoes, tennis shoes preferred (No heels or open toe shoes are allowed). Do NOT wear cologne, perfume, aftershave, or lotions (deodorant is allowed). The test will take approximately 3 to 4 hours to complete If these instructions are not followed, your test will have to be rescheduled.    Follow-Up: At [facility], you and your health needs are our priority.  As part of our continuing mission to provide you with exceptional heart care, we have created designated Provider Care Teams.  These Care Teams include your primary Cardiologist (physician) and Advanced Practice Providers (APPs -  Physician Assistants and Nurse Practitioners) who all work together to provide you with the care you need, when you need it.  We recommend signing up for the patient portal called "MyChart".  Sign up information is provided on this After Visit Summary.  MyChart is used to connect with patients for Virtual Visits (Telemedicine).  Patients are able to view lab/test results, encounter notes, upcoming appointments, etc.  Non-urgent messages can be sent to your provider as well.   To learn more about what you can do with MyChart, go to [email or web address].    Your next appointment:   3 month(s) on a device day  The format for your next appointment:  In Person  Provider:   Sanda Klein, MD {   Important Information About Sugar         Signed, Sanda Klein, MD   04/19/2022 11:16 AM    [address], [place], [place]  [address] Phone: [phone]; Fax: [phone]

## 2022-04-19 NOTE — Telephone Encounter (Signed)
Patient given detailed instructions per Myocardial Perfusion Study Information Sheet for the test on 04/22/2022 at 10:45. Patient notified to arrive 15 minutes early and that it is imperative to arrive on time for appointment to keep from having the test rescheduled.  If you need to cancel or reschedule your appointment, please call the office within 24 hours of your appointment. . Patient verbalized understanding.[name]

## 2022-04-19 NOTE — Patient Instructions (Signed)
Medication Instructions:  No changes *If you need a refill on your cardiac medications before your next appointment, please call your pharmacy*   Lab Work: None ordered If you have labs (blood work) drawn today and your tests are completely normal, you will receive your results only by: MyChart Message (if you have MyChart) OR A paper copy in the mail If you have any lab test that is abnormal or we need to change your treatment, we will call you to review the results.   Testing/Procedures: Your physician has requested that you have an echocardiogram. Echocardiography is a painless test that uses sound waves to create images of your heart. It provides your doctor with information about the size and shape of your heart and how well your heart's chambers and valves are working. You may receive an ultrasound enhancing agent through an IV if needed to better visualize your heart during the echo.This procedure takes approximately one hour. There are no restrictions for this procedure. This will take place at the [address]   Your physician has requested that you have a lexiscan myoview. For further information please visit [email or web address] Please follow instruction sheet, as given. This will take place at [facility]  How to prepare for your Myocardial Perfusion Test: Do not eat or drink 3 hours prior to your test, except you may have water. Do not consume products containing caffeine (regular or decaffeinated) 12 hours prior to your test. (ex: coffee, chocolate, sodas, tea). Do bring a list of your current medications with you.  If not listed below, you may take your medications as normal. Do wear comfortable clothes (no dresses or overalls) and walking shoes, tennis shoes preferred (No heels or open toe shoes are allowed). Do NOT wear cologne, perfume, aftershave, or lotions (deodorant is allowed). The test will take approximately 3 to 4 hours to complete If these  instructions are not followed, your test will have to be rescheduled.    Follow-Up: At [facility], you and your health needs are our priority.  As part of our continuing mission to provide you with exceptional heart care, we have created designated Provider Care Teams.  These Care Teams include your primary Cardiologist (physician) and Advanced Practice Providers (APPs -  Physician Assistants and Nurse Practitioners) who all work together to provide you with the care you need, when you need it.  We recommend signing up for the patient portal called "MyChart".  Sign up information is provided on this After Visit Summary.  MyChart is used to connect with patients for Virtual Visits (Telemedicine).  Patients are able to view lab/test results, encounter notes, upcoming appointments, etc.  Non-urgent messages can be sent to your provider as well.   To learn more about what you can do with MyChart, go to [email or web address].    Your next appointment:   3 month(s) on a device day  The format for your next appointment:   In Person  Provider:   Thurmon Fair, MD {   Important Information About Sugar

## 2022-04-22 ENCOUNTER — Ambulatory Visit (HOSPITAL_COMMUNITY): Payer: Medicare PPO | Attending: Cardiovascular Disease

## 2022-04-22 DIAGNOSIS — I25708 Atherosclerosis of coronary artery bypass graft(s), unspecified, with other forms of angina pectoris: Secondary | ICD-10-CM | POA: Diagnosis present

## 2022-04-22 LAB — MYOCARDIAL PERFUSION IMAGING
LV dias vol: 51 mL (ref 62–150)
LV sys vol: 23 mL
Nuc Stress EF: 55 %
Peak HR: 108 {beats}/min
Rest HR: 91 {beats}/min
Rest Nuclear Isotope Dose: 8.7 mCi
SDS: 0
SRS: 0
SSS: 0
ST Depression (mm): 0 mm
Stress Nuclear Isotope Dose: 28.4 mCi
TID: 0.87

## 2022-04-22 MED ORDER — TECHNETIUM TC 99M TETROFOSMIN IV KIT
8.7000 | PACK | Freq: Once | INTRAVENOUS | Status: AC | PRN
  Administered 2022-04-22: 8.7 via INTRAVENOUS

## 2022-04-22 MED ORDER — TECHNETIUM TC 99M TETROFOSMIN IV KIT
28.4000 | PACK | Freq: Once | INTRAVENOUS | Status: AC | PRN
  Administered 2022-04-22: 28.4 via INTRAVENOUS

## 2022-04-22 MED ORDER — REGADENOSON 0.4 MG/5ML IV SOLN
0.4000 mg | Freq: Once | INTRAVENOUS | Status: AC
  Administered 2022-04-22: 0.4 mg via INTRAVENOUS

## 2022-05-03 ENCOUNTER — Ambulatory Visit (HOSPITAL_COMMUNITY): Payer: Medicare PPO | Attending: Cardiology

## 2022-05-03 DIAGNOSIS — I480 Paroxysmal atrial fibrillation: Secondary | ICD-10-CM | POA: Diagnosis present

## 2022-05-03 DIAGNOSIS — I25708 Atherosclerosis of coronary artery bypass graft(s), unspecified, with other forms of angina pectoris: Secondary | ICD-10-CM | POA: Diagnosis not present

## 2022-05-03 LAB — ECHOCARDIOGRAM COMPLETE
Area-P 1/2: 3.42 cm2
P 1/2 time: 552 msec
S' Lateral: 2.9 cm

## 2022-05-08 ENCOUNTER — Encounter: Payer: Self-pay | Admitting: *Deleted

## 2022-06-12 ENCOUNTER — Ambulatory Visit (INDEPENDENT_AMBULATORY_CARE_PROVIDER_SITE_OTHER): Payer: Medicare PPO

## 2022-06-12 DIAGNOSIS — I495 Sick sinus syndrome: Secondary | ICD-10-CM

## 2022-06-12 LAB — CUP PACEART REMOTE DEVICE CHECK
Battery Impedance: 884 Ohm
Battery Remaining Longevity: 67 mo
Battery Voltage: 2.77 V
Brady Statistic AP VP Percent: 0 %
Brady Statistic AP VS Percent: 83 %
Brady Statistic AS VP Percent: 0 %
Brady Statistic AS VS Percent: 16 %
Date Time Interrogation Session: 20230802131128
Implantable Lead Implant Date: 20071115
Implantable Lead Implant Date: 20071115
Implantable Lead Location: 753859
Implantable Lead Location: 753860
Implantable Lead Model: 4092
Implantable Lead Model: 5076
Implantable Pulse Generator Implant Date: 20150929
Lead Channel Impedance Value: 499 Ohm
Lead Channel Impedance Value: 588 Ohm
Lead Channel Pacing Threshold Amplitude: 0.5 V
Lead Channel Pacing Threshold Amplitude: 0.5 V
Lead Channel Pacing Threshold Pulse Width: 0.4 ms
Lead Channel Pacing Threshold Pulse Width: 0.4 ms
Lead Channel Setting Pacing Amplitude: 2 V
Lead Channel Setting Pacing Amplitude: 2.5 V
Lead Channel Setting Pacing Pulse Width: 0.4 ms
Lead Channel Setting Sensing Sensitivity: 5.6 mV

## 2022-07-05 NOTE — Progress Notes (Signed)
Remote pacemaker transmission.   

## 2022-07-22 ENCOUNTER — Encounter: Payer: Self-pay | Admitting: Cardiovascular Disease

## 2022-07-22 ENCOUNTER — Ambulatory Visit: Payer: Medicare PPO | Attending: Cardiovascular Disease | Admitting: Cardiovascular Disease

## 2022-07-22 VITALS — BP 110/72 | HR 76 | Ht 68.0 in | Wt 166.0 lb

## 2022-07-22 DIAGNOSIS — I25810 Atherosclerosis of coronary artery bypass graft(s) without angina pectoris: Secondary | ICD-10-CM | POA: Diagnosis not present

## 2022-07-22 DIAGNOSIS — Z950 Presence of cardiac pacemaker: Secondary | ICD-10-CM

## 2022-07-22 DIAGNOSIS — D6869 Other thrombophilia: Secondary | ICD-10-CM | POA: Diagnosis not present

## 2022-07-22 DIAGNOSIS — I495 Sick sinus syndrome: Secondary | ICD-10-CM | POA: Diagnosis not present

## 2022-07-22 DIAGNOSIS — I480 Paroxysmal atrial fibrillation: Secondary | ICD-10-CM

## 2022-07-22 DIAGNOSIS — I10 Essential (primary) hypertension: Secondary | ICD-10-CM

## 2022-07-22 DIAGNOSIS — E039 Hypothyroidism, unspecified: Secondary | ICD-10-CM | POA: Insufficient documentation

## 2022-07-22 DIAGNOSIS — E7800 Pure hypercholesterolemia, unspecified: Secondary | ICD-10-CM

## 2022-07-22 NOTE — Progress Notes (Addendum)
Cardiology Office Note    Date:  07/22/2022   ID:  [name], DOB Nov 18, 1932, MRN [id number]  PCP:  Baxter Hire, MD  Cardiologist:   Sanda Klein, MD   Chief Complaint  Patient presents with   Atrial Fibrillation    History of Present Illness:  [name] is a 88 y.o. male with CAD s/p CABG, sinus node dysfunction and paroxysmal atrial fibrillation, status post dual-chamber permanent pacemaker implantation in 2007 (generator change 2015, Medtronic Adapta) returning for follow-up.   He has not really been troubled by palpitations, but he has been concerned enough that he gave up playing tennis, since physical activity seem to predictably bring on the rapid palpitations.  He remains active, takes care of his own yard work and is generally quite agile.  His biggest current complaint is increased urinary frequency and burning over the last couple of days.  He has had urinary tract infections before.  He is seeing his urologist this afternoon.  He denies chest pain or shortness of breath at rest with activity has not had palpitations, dizziness, syncope, leg edema, orthopnea, PND, focal neurological complaints, falls, serious bleeding, claudication.  It's been about 16 years since he underwent bypass surgery.  It is important to note that he had no angina pectoris before bypass.  He experienced fatigue and dyspnea, feeling "washed out" with work-up eventually leading to bypass surgery.  We rechecked an echocardiogram and a Lexiscan Myoview a few months ago.  He had a normal pattern of myocardial perfusion.  LV systolic function was normal by both echo and quantitative scintigraphy.  His left atrium was moderately dilated.  As before he has a borderline elevated pulmonary artery pressure of about 38 mmHg, without any abnormalities of the right heart.  The ascending aorta is borderline dilated, unchanged from previous echo.  Comprehensive interrogation of his pacemaker today shows  normal device function.  His leads are from 2007 (atrial Medtronic [id number], ventricular Medtronic [id number]) and his pacemaker is a dual-chamber Medtronic Adapta device implanted in 2015. The lead parameters are all normal.  Estimated generator longevity is 5.5 years.  He has 84% atrial pacing and only 0.3% ventricular pacing.  The burden of atrial fibrillation has been higher than before at about 6.3%, but this is largely made out of a almost 4-day long episode of started on June 12 and ended on June 16.  Rate control during this event was good with an average ventricular rate of 78 bpm.   He has an occasional episode of paroxysmal atrial tachycardia with one-to-one, AV conduction and tachycardia, the longest episode being 5 minutes in duration.  Otherwise he has good ventricular rate control during atrial fibrillation.  Heart rate histogram distribution in normal rhythm suggest good sensor settings.  He was poorly tolerant of rosuvastatin due to myopathy symptoms when he was taken on a daily basis.  Continues to take it every other day and is tolerating it fine.  Normal left ventricular systolic function by echo most recently performed in 2014. Last nuclear stress test in 2012 was normal. He underwent bypass surgery in 2007 (Dr. Cyndia Bent 2007, LIMA to LAD, SVG to OM, SVG to RCA). His pacemaker is a Medtronic Adapta dual-chamber device implanted in 2015 (leads from 2007). He has treated hyperlipidemia, followed by his primary care physician. He has mild thrombocytopenia without bleeding complications.  Past Medical History:  Diagnosis Date   Arthritis    Hyperlipidemia    Hypertension  Hypothyroidism    Paroxysmal atrial fibrillation ([place]) 11/22/2014   Thrombocytopenia ([place]) 05/23/2015    Past Surgical History:  Procedure Laterality Date   bilateral carotid duplex ultrasound showed no ICA     stenosis   CARDIAC CATHETERIZATION     Dr. Tami Ribas which showed a 95% ostial LAD stenosis   CORONARY ARTERY  BYPASS GRAFT     x3 using a left internal mammary artery   PACEMAKER GENERATOR CHANGE N/A 08/09/2014   Procedure: PACEMAKER GENERATOR CHANGE [facility] Spine;  Surgeon: Sanda Klein, MD;  Location: [facility];  Service: Cardiovascular;  Laterality: N/A;   Placement of permanent pacemaker     US ECHOCARDIOGRAPHY     lft ventricular ejection fraction was about 60%    Current Medications: Outpatient Medications Prior to Visit  Medication Sig Dispense Refill   cyanocobalamin (VITAMIN B12) 1000 MCG tablet Take 1,000 mcg by mouth daily.     ELIQUIS 5 MG TABS tablet TAKE ONE (1) TABLET BY MOUTH TWO TIMES PER DAY 60 tablet 5   liothyronine (CYTOMEL) 5 MCG tablet Take 5 mcg by mouth daily.     metoprolol succinate (TOPROL-XL) 25 MG 24 hr tablet TAKE ONE TABLET BY MOUTH EVERY DAY 90 tablet 3   rosuvastatin (CRESTOR) 10 MG tablet Take 1 tablet (10 mg total) by mouth daily. 90 tablet 3   SYNTHROID 100 MCG tablet Take 100 mg by mouth daily.     No facility-administered medications prior to visit.     Allergies:   Latex and Sulfa antibiotics   Social History   Socioeconomic History   Marital status: Married    Spouse name: Not on file   Number of children: Not on file   Years of education: Not on file   Highest education level: Not on file  Occupational History   Not on file  Tobacco Use   Smoking status: Never   Smokeless tobacco: Never  Substance and Sexual Activity   Alcohol use: Yes    Comment: occas.   Drug use: No   Sexual activity: Not on file  Other Topics Concern   Not on file  Social History Narrative   Not on file   Social Determinants of Health   Financial Resource Strain: Not on file  Food Insecurity: Not on file  Transportation Needs: Not on file  Physical Activity: Not on file  Stress: Not on file  Social Connections: Not on file     Family History:  The patient's family history includes Hypertension in his mother; Stroke in his mother.   ROS:   Please see the  history of present illness.    ROS All other systems are reviewed and are negative.  PHYSICAL EXAM:   VS:  BP 110/72 (BP Location: Left Arm, Patient Position: Sitting, Cuff Size: Normal)   Pulse 76   Ht 5' 8"  (1.727 m)   Wt 166 lb (75.3 kg)   SpO2 93%   BMI 25.24 kg/m      General: Alert, oriented x3, no distress, appears younger than stated age.  Healthy subclavian pacemaker site. Head: no evidence of trauma, PERRL, EOMI, no exophtalmos or lid lag, no myxedema, no xanthelasma; normal ears, nose and oropharynx Neck: normal jugular venous pulsations and no hepatojugular reflux; brisk carotid pulses without delay and no carotid bruits Chest: clear to auscultation, no signs of consolidation by percussion or palpation, normal fremitus, symmetrical and full respiratory excursions Cardiovascular: normal position and quality of the apical impulse, regular rhythm, normal  first and second heart sounds, no murmurs, rubs or gallops Abdomen: no tenderness or distention, no masses by palpation, no abnormal pulsatility or arterial bruits, normal bowel sounds, no hepatosplenomegaly Extremities: no clubbing, cyanosis or edema; 2+ radial, ulnar and brachial pulses bilaterally; 2+ right femoral, posterior tibial and dorsalis pedis pulses; 2+ left femoral, posterior tibial and dorsalis pedis pulses; no subclavian or femoral bruits Neurological: grossly nonfocal Psych: Normal mood and affect  Psych: Normal mood and affect    Wt Readings from Last 3 Encounters:  07/22/22 166 lb (75.3 kg)  04/22/22 164 lb (74.4 kg)  04/19/22 164 lb 12.8 oz (74.8 kg)      Studies/Labs Reviewed:  04/22/2022 Lexiscan Myoview   Findings are consistent with no prior ischemia and no prior myocardial infarction. The study is low risk.   No ST deviation was noted.   LV perfusion is normal. There is no evidence of ischemia.   Left ventricular function is abnormal. There was a single regional abnormality. Nuclear stress EF:  55 %. The left ventricular ejection fraction is normal (55-65%). End diastolic cavity size is normal. End systolic cavity size is normal.   Prior study available for comparison from 02/11/2011.   05/03/2022 echocardiogram    1. Left ventricular ejection fraction, by estimation, is 60 to 65%. The  left ventricle has normal function. The left ventricle has no regional  wall motion abnormalities. There is moderate asymmetric left ventricular  hypertrophy of the basal-septal  segment. Left ventricular diastolic parameters are consistent with Grade  II diastolic dysfunction (pseudonormalization).   2. Right ventricular systolic function is normal. The right ventricular  size is normal. There is mildly elevated pulmonary artery systolic  pressure.   3. Left atrial size was moderately dilated.   4. The mitral valve is degenerative. Mild mitral valve regurgitation. No  evidence of mitral stenosis.   5. Tricuspid valve regurgitation is mild to moderate.   6. The aortic valve is tricuspid. There is mild calcification of the  aortic valve. Aortic valve regurgitation is mild. Aortic valve  sclerosis/calcification is present, without any evidence of aortic  stenosis.   7. Aortic dilatation noted. There is borderline dilatation of the aortic  root, measuring 39 mm.   8. The inferior vena cava is normal in size with greater than 50%  respiratory variability, suggesting right atrial pressure of 3 mmHg.   Comparison(s): 05/11/13 EF 55-60%. PA pressure 4mHg.  EKG:  EKG is not ordered today.  At his last visit the ECG showed mostly atrial paced, ventricular sensed rhythm with occasional sinus beats/PACs, long AV delay at about 240 ms.  There are no ischemic repolarization abnormalities.  The QTc is normal at 421 ms.  Also reviewed ECG tracings during his Lexiscan Myoview study on April 22, 2022.  He was in atrial fibrillation with good ventricular rate control throughout the entire study.  Recent  Labs: Labs from November 30, 2019 show total cholesterol 152, triglycerides 57, HDL 51, LDL 90 Labs from May 31, 2020 show potassium 5.0, creatinine 1.2, glucose 100, TSH 4.789, hemoglobin 14.5  06/10/2022 TSH 4.186, hemoglobin 14.8, potassium 4.6, creatinine 1.1, normal liver function tests Cholesterol 147, triglycerides 70, HDL 50, LDL 83  Lipid Panel     Component Value Date/Time   CHOL 162 09/24/2021 0845   TRIG 89 09/24/2021 0845   HDL 52 09/24/2021 0845   CHOLHDL 3.1 09/24/2021 0845   LDLCALC 93 09/24/2021 0845   LABVLDL 17 09/24/2021 0845   BMET  Component Value Date/Time   NA 140 09/24/2021 0845   K 4.6 09/24/2021 0845   CL 102 09/24/2021 0845   CO2 25 09/24/2021 0845   GLUCOSE 88 09/24/2021 0845   GLUCOSE 92 01/13/2010 0628   BUN 12 09/24/2021 0845   CREATININE 1.06 09/24/2021 0845   CALCIUM 9.1 09/24/2021 0845   EGFR 68 09/24/2021 0845   GFRNONAA 55 (L) 12/09/2017 0901    ASSESSMENT:    1. Coronary artery disease involving coronary bypass graft of native heart without angina pectoris   2. Paroxysmal atrial fibrillation (HCC)   3. Acquired thrombophilia ([place])   4. SSS (sick sinus syndrome) ([place])   5. Pacemaker   6. Essential hypertension   7. Hypercholesterolemia   8. Acquired hypothyroidism      PLAN:  In order of problems listed above:  CAD s/p CABG: he has never had angina.  Low risk recent nuclear stress test in June 2023.  Normal left ventricular systolic function. AFib: The burden of atrial fibrillation remains relatively low, even lower if we were to eliminate the isolated 4-day episode in June.  All the other episodes are very brief.  Ventricular rate control is good, continue beta-blocker.  Some episodes of high ventricular rate are seen during atrial tachycardia, but those events are very brief.  I still do not think he should be started on antiarrhythmic therapy referred for ablation, since his arrhythmia is essentially asymptomatic.     CHADSVasc 4 (age 63, HTN, CAD).  Eliquis: Well-tolerated without bleeding complications.  No falls or injuries. SSS: Good heart rate histogram distribution with current sensor settings. PPM: Device function is normal..  Continue remote downloads every 3 months.  He has to go to an Internet store to do his downloads, since he has poor reception at home. HTN: Well-controlled HLP: LDL is not at target of 70 or less.  Not far from that with improving LDL at 83.  Consider adding ezetimibe 10 mg daily if future LDL cholesterol still not at target. Hypothyroidism: TSH was normal at 4.186 on 06/10/2022    Medication Adjustments/Labs and Tests Ordered: Current medicines are reviewed at length with the patient today.  Concerns regarding medicines are outlined above.  Medication changes, Labs and Tests ordered today are listed in the Patient Instructions below. Patient Instructions  Medication Instructions:  No changes *If you need a refill on your cardiac medications before your next appointment, please call your pharmacy*   Lab Work: None ordered If you have labs (blood work) drawn today and your tests are completely normal, you will receive your results only by: [place] (if you have MyChart) OR A paper copy in the mail If you have any lab test that is abnormal or we need to change your treatment, we will call you to review the results.   Testing/Procedures: None ordered   Follow-Up: At [facility], you and your health needs are our priority.  As part of our continuing mission to provide you with exceptional heart care, we have created designated Provider Care Teams.  These Care Teams include your primary Cardiologist (physician) and Advanced Practice Providers (APPs -  Physician Assistants and Nurse Practitioners) who all work together to provide you with the care you need, when you need it.  We recommend signing up for the patient portal called "MyChart".  Sign up  information is provided on this After Visit Summary.  MyChart is used to connect with patients for Virtual Visits (Telemedicine).  Patients are able  to view lab/test results, encounter notes, upcoming appointments, etc.  Non-urgent messages can be sent to your provider as well.   To learn more about what you can do with MyChart, go to [email or web address].    Your next appointment:   12 month(s)  The format for your next appointment:   In Person  Provider:   Sanda Klein, MD      Important Information About Sugar         Signed, Sanda Klein, MD  07/22/2022 4:41 PM    [facility], [place], [place]  [address] Phone: [phone]; Fax: [phone]

## 2022-07-22 NOTE — Patient Instructions (Signed)
Medication Instructions:  No changes *If you need a refill on your cardiac medications before your next appointment, please call your pharmacy*   Lab Work: None ordered If you have labs (blood work) drawn today and your tests are completely normal, you will receive your results only by: MyChart Message (if you have MyChart) OR A paper copy in the mail If you have any lab test that is abnormal or we need to change your treatment, we will call you to review the results.   Testing/Procedures: None ordered   Follow-Up: At [facility], you and your health needs are our priority.  As part of our continuing mission to provide you with exceptional heart care, we have created designated Provider Care Teams.  These Care Teams include your primary Cardiologist (physician) and Advanced Practice Providers (APPs -  Physician Assistants and Nurse Practitioners) who all work together to provide you with the care you need, when you need it.  We recommend signing up for the patient portal called "MyChart".  Sign up information is provided on this After Visit Summary.  MyChart is used to connect with patients for Virtual Visits (Telemedicine).  Patients are able to view lab/test results, encounter notes, upcoming appointments, etc.  Non-urgent messages can be sent to your provider as well.   To learn more about what you can do with MyChart, go to [email or web address].    Your next appointment:   12 month(s)  The format for your next appointment:   In Person  Provider:   Mihai Croitoru, MD      Important Information About Sugar       

## 2022-09-11 ENCOUNTER — Ambulatory Visit (INDEPENDENT_AMBULATORY_CARE_PROVIDER_SITE_OTHER): Payer: Medicare PPO

## 2022-09-11 DIAGNOSIS — I495 Sick sinus syndrome: Secondary | ICD-10-CM

## 2022-09-12 LAB — CUP PACEART REMOTE DEVICE CHECK
Battery Impedance: 963 Ohm
Battery Remaining Longevity: 64 mo
Battery Voltage: 2.77 V
Brady Statistic AP VP Percent: 1 %
Brady Statistic AP VS Percent: 79 %
Brady Statistic AS VP Percent: 0 %
Brady Statistic AS VS Percent: 20 %
Date Time Interrogation Session: 20231101162332
Implantable Lead Connection Status: 753985
Implantable Lead Connection Status: 753985
Implantable Lead Implant Date: 20071115
Implantable Lead Implant Date: 20071115
Implantable Lead Location: 753859
Implantable Lead Location: 753860
Implantable Lead Model: 4092
Implantable Lead Model: 5076
Implantable Pulse Generator Implant Date: 20150929
Lead Channel Impedance Value: 493 Ohm
Lead Channel Impedance Value: 588 Ohm
Lead Channel Pacing Threshold Amplitude: 0.5 V
Lead Channel Pacing Threshold Amplitude: 0.5 V
Lead Channel Pacing Threshold Pulse Width: 0.4 ms
Lead Channel Pacing Threshold Pulse Width: 0.4 ms
Lead Channel Setting Pacing Amplitude: 2 V
Lead Channel Setting Pacing Amplitude: 2.5 V
Lead Channel Setting Pacing Pulse Width: 0.4 ms
Lead Channel Setting Sensing Sensitivity: 5.6 mV
Zone Setting Status: 755011
Zone Setting Status: 755011

## 2022-09-18 ENCOUNTER — Other Ambulatory Visit: Payer: Self-pay | Admitting: Cardiovascular Disease

## 2022-09-20 NOTE — Telephone Encounter (Signed)
Prescription refill request for Eliquis received. Indication:afib Last office visit:9/23 Scr:1.1 Age: 88 Weight:75.3  kg  Prescription refilled

## 2022-09-24 NOTE — Progress Notes (Signed)
Remote pacemaker transmission.   

## 2023-03-12 ENCOUNTER — Ambulatory Visit (INDEPENDENT_AMBULATORY_CARE_PROVIDER_SITE_OTHER): Payer: Medicare PPO

## 2023-03-12 DIAGNOSIS — I495 Sick sinus syndrome: Secondary | ICD-10-CM

## 2023-03-12 LAB — CUP PACEART REMOTE DEVICE CHECK
Battery Impedance: 1093 Ohm
Battery Remaining Longevity: 59 mo
Battery Voltage: 2.77 V
Brady Statistic AP VP Percent: 1 %
Brady Statistic AP VS Percent: 74 %
Brady Statistic AS VP Percent: 0 %
Brady Statistic AS VS Percent: 25 %
Date Time Interrogation Session: 20240501135330
Implantable Lead Connection Status: 753985
Implantable Lead Connection Status: 753985
Implantable Lead Implant Date: 20071115
Implantable Lead Implant Date: 20071115
Implantable Lead Location: 753859
Implantable Lead Location: 753860
Implantable Lead Model: 4092
Implantable Lead Model: 5076
Implantable Pulse Generator Implant Date: 20150929
Lead Channel Impedance Value: 509 Ohm
Lead Channel Impedance Value: 613 Ohm
Lead Channel Pacing Threshold Amplitude: 0.5 V
Lead Channel Pacing Threshold Amplitude: 0.5 V
Lead Channel Pacing Threshold Pulse Width: 0.4 ms
Lead Channel Pacing Threshold Pulse Width: 0.4 ms
Lead Channel Setting Pacing Amplitude: 2 V
Lead Channel Setting Pacing Amplitude: 2.5 V
Lead Channel Setting Pacing Pulse Width: 0.4 ms
Lead Channel Setting Sensing Sensitivity: 5.6 mV
Zone Setting Status: 755011
Zone Setting Status: 755011

## 2023-03-17 ENCOUNTER — Other Ambulatory Visit: Payer: Self-pay | Admitting: Cardiovascular Disease

## 2023-03-17 DIAGNOSIS — I480 Paroxysmal atrial fibrillation: Secondary | ICD-10-CM

## 2023-03-17 NOTE — Telephone Encounter (Signed)
Prescription refill request for Eliquis received. Indication: Afib  Last office visit:07/22/22 ([place])  Scr: 1.2 (12/12/22)  Age: 89 Weight: 75.3kg  Appropriate dose. Refill sent.

## 2023-04-02 NOTE — Progress Notes (Signed)
Remote pacemaker transmission.   

## 2023-06-11 ENCOUNTER — Ambulatory Visit (INDEPENDENT_AMBULATORY_CARE_PROVIDER_SITE_OTHER): Payer: Medicare PPO

## 2023-06-11 DIAGNOSIS — I495 Sick sinus syndrome: Secondary | ICD-10-CM | POA: Diagnosis not present

## 2023-06-12 LAB — CUP PACEART REMOTE DEVICE CHECK
Battery Impedance: 1174 Ohm
Battery Remaining Longevity: 56 mo
Battery Voltage: 2.77 V
Brady Statistic AP VP Percent: 1 %
Brady Statistic AP VS Percent: 73 %
Brady Statistic AS VP Percent: 0 %
Brady Statistic AS VS Percent: 25 %
Date Time Interrogation Session: 20240801111713
Implantable Lead Connection Status: 753985
Implantable Lead Connection Status: 753985
Implantable Lead Implant Date: 20071115
Implantable Lead Implant Date: 20071115
Implantable Lead Location: 753859
Implantable Lead Location: 753860
Implantable Lead Model: 4092
Implantable Lead Model: 5076
Implantable Pulse Generator Implant Date: 20150929
Lead Channel Impedance Value: 501 Ohm
Lead Channel Impedance Value: 594 Ohm
Lead Channel Pacing Threshold Amplitude: 0.375 V
Lead Channel Pacing Threshold Amplitude: 0.5 V
Lead Channel Pacing Threshold Pulse Width: 0.4 ms
Lead Channel Pacing Threshold Pulse Width: 0.4 ms
Lead Channel Setting Pacing Amplitude: 2 V
Lead Channel Setting Pacing Amplitude: 2.5 V
Lead Channel Setting Pacing Pulse Width: 0.4 ms
Lead Channel Setting Sensing Sensitivity: 5.6 mV
Zone Setting Status: 755011
Zone Setting Status: 755011

## 2023-07-02 NOTE — Progress Notes (Signed)
Remote pacemaker transmission.   

## 2023-07-23 DIAGNOSIS — N401 Enlarged prostate with lower urinary tract symptoms: Secondary | ICD-10-CM | POA: Insufficient documentation

## 2023-07-24 ENCOUNTER — Ambulatory Visit: Payer: Medicare PPO | Attending: Cardiovascular Disease | Admitting: Cardiovascular Disease

## 2023-07-24 ENCOUNTER — Encounter: Payer: Self-pay | Admitting: Cardiovascular Disease

## 2023-07-24 VITALS — BP 134/70 | HR 71 | Ht 68.0 in | Wt 168.0 lb

## 2023-07-24 DIAGNOSIS — I480 Paroxysmal atrial fibrillation: Secondary | ICD-10-CM | POA: Diagnosis not present

## 2023-07-24 DIAGNOSIS — I25810 Atherosclerosis of coronary artery bypass graft(s) without angina pectoris: Secondary | ICD-10-CM

## 2023-07-24 DIAGNOSIS — D6869 Other thrombophilia: Secondary | ICD-10-CM

## 2023-07-24 DIAGNOSIS — Z950 Presence of cardiac pacemaker: Secondary | ICD-10-CM

## 2023-07-24 DIAGNOSIS — E039 Hypothyroidism, unspecified: Secondary | ICD-10-CM

## 2023-07-24 DIAGNOSIS — I495 Sick sinus syndrome: Secondary | ICD-10-CM

## 2023-07-24 DIAGNOSIS — I10 Essential (primary) hypertension: Secondary | ICD-10-CM

## 2023-07-24 DIAGNOSIS — E7800 Pure hypercholesterolemia, unspecified: Secondary | ICD-10-CM

## 2023-07-24 NOTE — Progress Notes (Signed)
Cardiology Office Note    Date:  07/24/2023   ID:  [name], DOB [birth year], MRN [id number]  PCP:  Gracelyn Nurse, MD  Cardiologist:   Thurmon Fair, MD   Chief Complaint  Patient presents with   Atrial Fibrillation    History of Present Illness:  [name] is a 89 y.o. male with CAD s/p CABG, sinus node dysfunction and paroxysmal atrial fibrillation, status post dual-chamber permanent pacemaker implantation in 2007 (generator change 2015, Medtronic Adapta) returning for follow-up.   He is infrequently troubled by palpitations.  He does not have any shortness of breath with walking, occasionally gets a little dyspneic if he performs long periods of weed eating.  He has not had dizziness or syncope.  Denies orthopnea, PND, lower extremity edema or any chest pain at rest or with activity.  Pacemaker interrogation shows a relatively high burden of atrial fibrillation on 6 been sent, with consistently good ventricular rate control during sustained episodes (some short of bursts of ectopic atrial tachycardia are associated with brief RVR).  His longest episode of atrial fibrillation lasted for 81 hours, more than 3 days.  Otherwise he has 74% atrial pacing and only 1.8% ventricular pacing.  He still has the original leads from 2007 (atrial lead Medtronic [id number], ventricularly Medtronic [id number]) with excellent parameters.  The ventricular lead is not MRI conditional.  Heart rate histogram distribution is appropriate.  The current generator has an estimated longevity of 4.5 years.  It's been about 17 years since he underwent bypass surgery.  It is important to note that he had no angina pectoris before bypass.  He experienced fatigue and dyspnea, feeling "washed out" with work-up eventually leading to bypass surgery.  We rechecked an echocardiogram and a Lexiscan Myoview in June 2023.  He had a normal pattern of myocardial perfusion.  LV systolic function was normal by both echo and  quantitative scintigraphy.  His left atrium was moderately dilated.  As before he has a borderline elevated pulmonary artery pressure of about 38 mmHg, without any abnormalities of the right heart.  The ascending aorta is borderline dilated, unchanged from previous echo.  He was poorly tolerant of rosuvastatin due to myopathy symptoms when he was taken on a daily basis.  Continues to take it every other day and is tolerating it fine.  Normal left ventricular systolic function by echo most recently performed in 2014. Last nuclear stress test in 2012 was normal. He underwent bypass surgery in 2007 (Dr. Laneta Simmers 2007, LIMA to LAD, SVG to OM, SVG to RCA). His pacemaker is a Medtronic Adapta dual-chamber device implanted in 2015 (leads from 2007). He has treated hyperlipidemia, followed by his primary care physician. He has mild thrombocytopenia without bleeding complications.  Past Medical History:  Diagnosis Date   Arthritis    Hyperlipidemia    Hypertension    Hypothyroidism    Paroxysmal atrial fibrillation (HCC) 11/22/2014   Thrombocytopenia (HCC) 05/23/2015    Past Surgical History:  Procedure Laterality Date   bilateral carotid duplex ultrasound showed no ICA     stenosis   CARDIAC CATHETERIZATION     Dr. Jenne Campus which showed a 95% ostial LAD stenosis   CORONARY ARTERY BYPASS GRAFT     x3 using a left internal mammary artery   PACEMAKER GENERATOR CHANGE N/A 08/09/2014   Procedure: PACEMAKER GENERATOR CHANGE Donny Pique;  Surgeon: Thurmon Fair, MD;  Location: [facility] CATH [facility];  Service: Cardiovascular;  Laterality: N/A;   Placement of  permanent pacemaker     US ECHOCARDIOGRAPHY     lft ventricular ejection fraction was about 60%    Current Medications: Outpatient Medications Prior to Visit  Medication Sig Dispense Refill   cyanocobalamin (VITAMIN B12) 1000 MCG tablet Take 1,000 mcg by mouth daily.     ELIQUIS 5 MG TABS tablet TAKE ONE (1) TABLET BY MOUTH TWO TIMES PER DAY 60 tablet 5    liothyronine (CYTOMEL) 5 MCG tablet Take 5 mcg by mouth daily.     metoprolol succinate (TOPROL-XL) 25 MG 24 hr tablet TAKE ONE TABLET BY MOUTH EVERY DAY 90 tablet 3   rosuvastatin (CRESTOR) 10 MG tablet TAKE 1 TABLET BY MOUTH DAILY 90 tablet 3   SYNTHROID 100 MCG tablet Take 100 mg by mouth daily.     No facility-administered medications prior to visit.     Allergies:   Latex and Sulfa antibiotics   Social History   Socioeconomic History   Marital status: Married    Spouse name: Not on file   Number of children: Not on file   Years of education: Not on file   Highest education level: Not on file  Occupational History   Not on file  Tobacco Use   Smoking status: Never   Smokeless tobacco: Never  Substance and Sexual Activity   Alcohol use: Yes    Comment: occas.   Drug use: No   Sexual activity: Not on file  Other Topics Concern   Not on file  Social History Narrative   Not on file   Social Determinants of Health   Financial Resource Strain: Low Risk  (06/24/2023)   Received from [facility]   Overall Financial Resource Strain (CARDIA)    Difficulty of Paying Living Expenses: Not hard at all  Food Insecurity: No Food Insecurity (06/24/2023)   Received from [facility]   Hunger Vital Sign    Worried About Running Out of Food in the Last Year: Never true    Ran Out of Food in the Last Year: Never true  Transportation Needs: No Transportation Needs (06/24/2023)   Received from [facility] - Transportation    In the past 12 months, has lack of transportation kept you from medical appointments or from getting medications?: No    Lack of Transportation (Non-Medical): No  Physical Activity: Not on file  Stress: Not on file  Social Connections: Not on file     Family History:  The patient's family history includes Hypertension in his mother; Stroke in his mother.   ROS:   Please see the history of present  illness.    ROS All other systems are reviewed and are negative.  PHYSICAL EXAM:   VS:  BP 134/70 (BP Location: Left Arm, Patient Position: Sitting, Cuff Size: Normal)   Pulse 71   Ht 5\' 8"  (1.727 m)   Wt 168 lb (76.2 kg)   SpO2 95%   BMI 25.54 kg/m      General: Alert, oriented x3, no distress, healthy left subclavian pacemaker site.  Looks younger than stated age. Head: no evidence of trauma, PERRL, EOMI, no exophtalmos or lid lag, no myxedema, no xanthelasma; normal ears, nose and oropharynx Neck: normal jugular venous pulsations and no hepatojugular reflux; brisk carotid pulses without delay and no carotid bruits Chest: clear to auscultation, no signs of consolidation by percussion or palpation, normal fremitus, symmetrical and full respiratory excursions Cardiovascular: normal position and quality of  the apical impulse, regular rhythm, normal first and second heart sounds, no murmurs, rubs or gallops Abdomen: no tenderness or distention, no masses by palpation, no abnormal pulsatility or arterial bruits, normal bowel sounds, no hepatosplenomegaly Extremities: no clubbing, cyanosis or edema; 2+ radial, ulnar and brachial pulses bilaterally; 2+ right femoral, posterior tibial and dorsalis pedis pulses; 2+ left femoral, posterior tibial and dorsalis pedis pulses; no subclavian or femoral bruits Neurological: grossly nonfocal Psych: Normal mood and affect     Wt Readings from Last 3 Encounters:  07/24/23 168 lb (76.2 kg)  07/22/22 166 lb (75.3 kg)  04/22/22 164 lb (74.4 kg)      Studies/Labs Reviewed:  04/22/2022 Lexiscan Myoview   Findings are consistent with no prior ischemia and no prior myocardial infarction. The study is low risk.   No ST deviation was noted.   LV perfusion is normal. There is no evidence of ischemia.   Left ventricular function is abnormal. There was a single regional abnormality. Nuclear stress EF: 55 %. The left ventricular ejection fraction is normal  (55-65%). End diastolic cavity size is normal. End systolic cavity size is normal.   Prior study available for comparison from 02/11/2011.   05/03/2022 echocardiogram   1. Left ventricular ejection fraction, by estimation, is 60 to 65%. The  left ventricle has normal function. The left ventricle has no regional  wall motion abnormalities. There is moderate asymmetric left ventricular  hypertrophy of the basal-septal  segment. Left ventricular diastolic parameters are consistent with Grade  II diastolic dysfunction (pseudonormalization).   2. Right ventricular systolic function is normal. The right ventricular  size is normal. There is mildly elevated pulmonary artery systolic  pressure.   3. Left atrial size was moderately dilated.   4. The mitral valve is degenerative. Mild mitral valve regurgitation. No  evidence of mitral stenosis.   5. Tricuspid valve regurgitation is mild to moderate.   6. The aortic valve is tricuspid. There is mild calcification of the  aortic valve. Aortic valve regurgitation is mild. Aortic valve  sclerosis/calcification is present, without any evidence of aortic  stenosis.   7. Aortic dilatation noted. There is borderline dilatation of the aortic  root, measuring 39 mm.   8. The inferior vena cava is normal in size with greater than 50%  respiratory variability, suggesting right atrial pressure of 3 mmHg.   Comparison(s): 05/11/13 EF 55-60%. PA pressure .   EKG:  EKG is ordered today.  It shows mostly atrial paced rhythm but transitions to sinus rhythm at the end of the tracing, with occasional PACs.  It is consistently ventricular sensed.   There are no ischemic repolarization abnormalities.    Intracardiac electrogram today shows atrial paced, ventricular sensed rhythm with occasional PACs.  Recent Labs: 06/17/2023 Cholesterol 139, triglycerides 63, HDL 53, LDL 73 Hemoglobin 16.1, potassium 4.7, creatinine 1.2, normal liver function tests TSH was  mildly elevated at 7.181 (in February it was 5.013). TSH 4.186, hemoglobin 14.8, potassium 4.6, creatinine 1.1, normal liver function tests   Lipid Panel     Component Value Date/Time   CHOL 162 09/24/2021 0845   TRIG 89 09/24/2021 0845   HDL 52 09/24/2021 0845   CHOLHDL 3.1 09/24/2021 0845   LDLCALC 93 09/24/2021 0845   LABVLDL 17 09/24/2021 0845   BMET    Component Value Date/Time   NA 140 09/24/2021 0845   K 4.6 09/24/2021 0845   CL 102 09/24/2021 0845   CO2 25 09/24/2021 0845  GLUCOSE 88 09/24/2021 0845   GLUCOSE 92 01/13/2010 0628   BUN 12 09/24/2021 0845   CREATININE 1.06 09/24/2021 0845   CALCIUM 9.1 09/24/2021 0845   EGFR 68 09/24/2021 0845   GFRNONAA 55 (L) 12/09/2017 0901    ASSESSMENT:    1. Coronary artery disease involving coronary bypass graft of native heart without angina pectoris   2. Paroxysmal atrial fibrillation (HCC)   3. Acquired thrombophilia (HCC)   4. SSS (sick sinus syndrome) (HCC)   5. Pacemaker   6. Essential hypertension   7. Hypercholesterolemia   8. Acquired hypothyroidism      PLAN:  In order of problems listed above:  CAD s/p CABG: may not be able to rely on symptoms to detect recurrent CAD since he has never had angina, but on the other and he remains very physically active without any complaints.  Low risk recent nuclear stress test in June 2023.  Normal left ventricular systolic function. AFib: Prevalence of fibrillation has increased to roughly 6% but rate control is good and the symptoms are fairly mild.  Some episodes of high ventricular rate are seen during atrial tachycardia, but those events are very brief.  We briefly discussed options for antiarrhythmic therapy with either pharmacological means or ablation, but since his symptoms are so limited, this is probably not indicated at this time.    CHADSVasc 4 (age 45, HTN, CAD).  Eliquis: Denies bleeding complications and falls.. SSS: Good heart rate histogram distribution with  current sensor settings. PPM: Device function is normal.  Continue remote downloads every 3 months.  He has to go to an Internet store to do his downloads, since he has poor reception at home. HTN: Well-controlled. HLP: LDL is close to target at 73.  Can add ezetimibe if necessary in the future.  Target LDL 70 or less. Hypothyroidism: Mildly elevated TSH in August, but he appears clinically euthyroid.    Medication Adjustments/Labs and Tests Ordered: Current medicines are reviewed at length with the patient today.  Concerns regarding medicines are outlined above.  Medication changes, Labs and Tests ordered today are listed in the Patient Instructions below. Patient Instructions  Medication Instructions:  No changes *If you need a refill on your cardiac medications before your next appointment, please call your pharmacy*  Follow-Up: At [facility], you and your health needs are our priority.  As part of our continuing mission to provide you with exceptional heart care, we have created designated Provider Care Teams.  These Care Teams include your primary Cardiologist (physician) and Advanced Practice Providers (APPs -  Physician Assistants and Nurse Practitioners) who all work together to provide you with the care you need, when you need it.  We recommend signing up for the patient portal called "MyChart".  Sign up information is provided on this After Visit Summary.  MyChart is used to connect with patients for Virtual Visits (Telemedicine).  Patients are able to view lab/test results, encounter notes, upcoming appointments, etc.  Non-urgent messages can be sent to your provider as well.   To learn more about what you can do with MyChart, go to [email or web address].    Your next appointment:   1 year(s)- PACER  Provider:   Thurmon Fair, MD       Signed, Thurmon Fair, MD  07/24/2023 4:42 PM    [facility] [address] [place], [place], [place]   [address] Phone: [phone]; Fax: [phone]

## 2023-07-24 NOTE — Patient Instructions (Signed)
Medication Instructions:  No changes *If you need a refill on your cardiac medications before your next appointment, please call your pharmacy*  Follow-Up: At [facility], you and your health needs are our priority.  As part of our continuing mission to provide you with exceptional heart care, we have created designated Provider Care Teams.  These Care Teams include your primary Cardiologist (physician) and Advanced Practice Providers (APPs -  Physician Assistants and Nurse Practitioners) who all work together to provide you with the care you need, when you need it.  We recommend signing up for the patient portal called "MyChart".  Sign up information is provided on this After Visit Summary.  MyChart is used to connect with patients for Virtual Visits (Telemedicine).  Patients are able to view lab/test results, encounter notes, upcoming appointments, etc.  Non-urgent messages can be sent to your provider as well.   To learn more about what you can do with MyChart, go to [email or web address].    Your next appointment:   1 year(s)- PACER  Provider:   Thurmon Fair, MD

## 2023-08-20 ENCOUNTER — Other Ambulatory Visit: Payer: Self-pay | Admitting: Cardiovascular Disease

## 2023-08-20 DIAGNOSIS — E040 Nontoxic diffuse goiter: Secondary | ICD-10-CM | POA: Insufficient documentation

## 2023-09-10 ENCOUNTER — Ambulatory Visit (INDEPENDENT_AMBULATORY_CARE_PROVIDER_SITE_OTHER): Payer: Medicare PPO

## 2023-09-10 DIAGNOSIS — I495 Sick sinus syndrome: Secondary | ICD-10-CM

## 2023-09-10 LAB — CUP PACEART REMOTE DEVICE CHECK
Battery Impedance: 1285 Ohm
Battery Remaining Longevity: 53 mo
Battery Voltage: 2.76 V
Brady Statistic AP VP Percent: 2 %
Brady Statistic AP VS Percent: 71 %
Brady Statistic AS VP Percent: 1 %
Brady Statistic AS VS Percent: 26 %
Date Time Interrogation Session: 20241030102412
Implantable Lead Connection Status: 753985
Implantable Lead Connection Status: 753985
Implantable Lead Implant Date: 20071115
Implantable Lead Implant Date: 20071115
Implantable Lead Location: 753859
Implantable Lead Location: 753860
Implantable Lead Model: 4092
Implantable Lead Model: 5076
Implantable Pulse Generator Implant Date: 20150929
Lead Channel Impedance Value: 501 Ohm
Lead Channel Impedance Value: 605 Ohm
Lead Channel Pacing Threshold Amplitude: 0.5 V
Lead Channel Pacing Threshold Amplitude: 0.5 V
Lead Channel Pacing Threshold Pulse Width: 0.4 ms
Lead Channel Pacing Threshold Pulse Width: 0.4 ms
Lead Channel Setting Pacing Amplitude: 2 V
Lead Channel Setting Pacing Amplitude: 2.5 V
Lead Channel Setting Pacing Pulse Width: 0.4 ms
Lead Channel Setting Sensing Sensitivity: 5.6 mV
Zone Setting Status: 755011
Zone Setting Status: 755011

## 2023-09-12 ENCOUNTER — Other Ambulatory Visit: Payer: Self-pay | Admitting: Cardiovascular Disease

## 2023-09-12 DIAGNOSIS — I480 Paroxysmal atrial fibrillation: Secondary | ICD-10-CM

## 2023-09-12 NOTE — Telephone Encounter (Signed)
Prescription refill request for Eliquis received. Indication: Afib  Last office visit: 07/24/23 ([place])  Scr: 1.2 (06/17/23)  Age: 89 Weight: 76.2kg  Appropriate dose. Refill sent.

## 2023-09-30 NOTE — Progress Notes (Signed)
Remote pacemaker transmission.   

## 2023-11-19 ENCOUNTER — Other Ambulatory Visit: Payer: Self-pay | Admitting: Cardiovascular Disease

## 2023-12-10 ENCOUNTER — Ambulatory Visit (INDEPENDENT_AMBULATORY_CARE_PROVIDER_SITE_OTHER): Payer: Medicare PPO

## 2023-12-10 DIAGNOSIS — I495 Sick sinus syndrome: Secondary | ICD-10-CM | POA: Diagnosis not present

## 2023-12-11 LAB — CUP PACEART REMOTE DEVICE CHECK
Battery Impedance: 1337 Ohm
Battery Remaining Longevity: 51 mo
Battery Voltage: 2.76 V
Brady Statistic AP VP Percent: 2 %
Brady Statistic AP VS Percent: 69 %
Brady Statistic AS VP Percent: 1 %
Brady Statistic AS VS Percent: 28 %
Date Time Interrogation Session: 20250130115402
Implantable Lead Connection Status: 753985
Implantable Lead Connection Status: 753985
Implantable Lead Implant Date: 20071115
Implantable Lead Implant Date: 20071115
Implantable Lead Location: 753859
Implantable Lead Location: 753860
Implantable Lead Model: 4092
Implantable Lead Model: 5076
Implantable Pulse Generator Implant Date: 20150929
Lead Channel Impedance Value: 494 Ohm
Lead Channel Impedance Value: 590 Ohm
Lead Channel Pacing Threshold Amplitude: 0.375 V
Lead Channel Pacing Threshold Amplitude: 0.5 V
Lead Channel Pacing Threshold Pulse Width: 0.4 ms
Lead Channel Pacing Threshold Pulse Width: 0.4 ms
Lead Channel Setting Pacing Amplitude: 2 V
Lead Channel Setting Pacing Amplitude: 2.5 V
Lead Channel Setting Pacing Pulse Width: 0.4 ms
Lead Channel Setting Sensing Sensitivity: 5.6 mV
Zone Setting Status: 755011
Zone Setting Status: 755011

## 2023-12-13 ENCOUNTER — Encounter: Payer: Self-pay | Admitting: Cardiovascular Disease

## 2023-12-24 ENCOUNTER — Ambulatory Visit: Payer: Medicare PPO | Admitting: Urology

## 2024-01-01 ENCOUNTER — Ambulatory Visit: Payer: Medicare PPO | Admitting: Urology

## 2024-01-20 NOTE — Progress Notes (Signed)
 Remote pacemaker transmission.

## 2024-01-20 NOTE — Addendum Note (Signed)
 Addended by: Elease Etienne A on: 01/20/2024 09:22 AM   Modules accepted: Orders

## 2024-01-27 ENCOUNTER — Ambulatory Visit: Admitting: Podiatry

## 2024-01-27 DIAGNOSIS — L603 Nail dystrophy: Secondary | ICD-10-CM | POA: Diagnosis not present

## 2024-01-27 NOTE — Progress Notes (Signed)
 Subjective:  Patient ID: [name], male    DOB: 02/16/1933,  MRN: [id number]  Chief Complaint  Patient presents with   Nail Problem    Left hallux nail fungus     89 y.o. male presents with the above complaint.  Patient presents with left hallux nail dystrophy pain on palpation hurts with ambulation worse with pressure.  Pain scale 7 out of 10 dull aching nature he would like to have removed he would like to make it permanent has not seen MRIs prior to seeing me denies any other acute complaints   Review of Systems: Negative except as noted in the HPI. Denies N/V/F/Ch.  Past Medical History:  Diagnosis Date   Arthritis    Hyperlipidemia    Hypertension    Hypothyroidism    Paroxysmal atrial fibrillation (HCC) 11/22/2014   Thrombocytopenia (HCC) 05/23/2015    Current Outpatient Medications:    cyanocobalamin (VITAMIN B12) 1000 MCG tablet, Take 1,000 mcg by mouth daily., Disp: , Rfl:    ELIQUIS 5 MG TABS tablet, TAKE ONE (1) TABLET BY MOUTH TWO TIMES PER DAY, Disp: 60 tablet, Rfl: 5   liothyronine (CYTOMEL) 5 MCG tablet, Take 5 mcg by mouth daily., Disp: , Rfl:    metoprolol succinate (TOPROL-XL) 25 MG 24 hr tablet, TAKE 1 TABLET BY MOUTH DAILY, Disp: 90 tablet, Rfl: 2   rosuvastatin (CRESTOR) 10 MG tablet, TAKE 1 TABLET BY MOUTH DAILY, Disp: 90 tablet, Rfl: 3   SYNTHROID 100 MCG tablet, Take 100 mg by mouth daily., Disp: , Rfl:   Social History   Tobacco Use  Smoking Status Never  Smokeless Tobacco Never    Allergies  Allergen Reactions   Latex Itching    PT STATES THAT AFTER WEARING LATEX GLOVES TO WORK IN THAT HIS HANDS WERE VERY ITCHY.   Sulfa Antibiotics Rash   Objective:  There were no vitals filed for this visit. There is no height or weight on file to calculate BMI. Constitutional Well developed. Well nourished.  Vascular Dorsalis pedis pulses palpable bilaterally. Posterior tibial pulses palpable bilaterally. Capillary refill normal to all digits.  No  cyanosis or clubbing noted. Pedal hair growth normal.  Neurologic Normal speech. Oriented to person, place, and time. Epicritic sensation to light touch grossly present bilaterally.  Dermatologic Pain on palpation of the entire/total nail on 1st digit of the left No other open wounds. No skin lesions.  Orthopedic: Normal joint ROM without pain or crepitus bilaterally. No visible deformities. No bony tenderness.   Radiographs: None Assessment:   1. Nail dystrophy    Plan:  Patient was evaluated and treated and all questions answered.  Nail contusion/dystrophy hallux, left -Patient elects to proceed with minor surgery to remove entire toenail today. Consent reviewed and signed by patient. -Entire/total nail excised. See procedure note. -Educated on post-procedure care including soaking. Written instructions provided and reviewed. -Patient to follow up in 2 weeks for nail check.  Procedure: Excision of entire/total nail  Location: Left 1st toe digit Anesthesia: Lidocaine 1% plain; 1.5 mL and Marcaine 0.5% plain; 1.5 mL, digital block. Skin Prep: Betadine. Dressing: Silvadene; telfa; dry, sterile, compression dressing. Technique: Following skin prep, the toe was exsanguinated and a tourniquet was secured at the base of the toe. The affected nail border was freed and excised.  Phenol matricectomy was performed in standard technique the tourniquet was then removed and sterile dressing applied. Disposition: Patient tolerated procedure well. Patient to return in 2 weeks for follow-up.   No  follow-ups on file.

## 2024-01-27 NOTE — Patient Instructions (Signed)

## 2024-02-06 ENCOUNTER — Encounter: Payer: Self-pay | Admitting: Urology

## 2024-02-06 ENCOUNTER — Ambulatory Visit: Payer: Medicare PPO | Admitting: Urology

## 2024-02-06 VITALS — BP 134/75 | HR 81 | Ht 70.0 in | Wt 170.0 lb

## 2024-02-06 DIAGNOSIS — N401 Enlarged prostate with lower urinary tract symptoms: Secondary | ICD-10-CM

## 2024-02-06 DIAGNOSIS — R351 Nocturia: Secondary | ICD-10-CM | POA: Diagnosis not present

## 2024-02-06 LAB — URINALYSIS, COMPLETE
Bilirubin, UA: NEGATIVE
Glucose, UA: NEGATIVE
Ketones, UA: NEGATIVE
Leukocytes,UA: NEGATIVE
Nitrite, UA: NEGATIVE
Protein,UA: NEGATIVE
Specific Gravity, UA: 1.02 (ref 1.005–1.030)
Urobilinogen, Ur: 0.2 mg/dL (ref 0.2–1.0)
pH, UA: 5.5 (ref 5.0–7.5)

## 2024-02-06 LAB — MICROSCOPIC EXAMINATION

## 2024-02-06 LAB — BLADDER SCAN AMB NON-IMAGING: Scan Result: 0

## 2024-02-06 NOTE — Progress Notes (Signed)
 I, Maysun Anabel Bene, acting as a scribe for Ian Altes, MD., have documented all relevant documentation on the behalf of Ian Altes, MD, as directed by Ian Altes, MD while in the presence of Ian Altes, MD.  02/06/2023 5:39 PM   Sandie Ano [birth year] [id number]  Referring provider: Ignacia Bayley, PA-C [phone] [facility] [address] [facility] [place],  [place] [address]  Chief Complaint  Patient presents with   Benign Prostatic Hypertrophy    HPI: [name] is a 89 y.o. male referred for evaluation of BPH.  States he was started on tamsulosin by PCP several months ago for mild dysuria and urinary hesitancy and decreased/intermittent urinary stream. He noted a marked improvement in his urinary symptoms on tamsulosin and states he wanted a second opinion on my thoughts whether this medication was the right choice.  He has no side effects including orthostatic symptoms.  He has occasional urinary tract infections and had a positive culture for Staph epi in January 2025.  IPSS today was 16/35. He does have nocturia x4. No history of snoring, though has not been tested for sleep apnea.   PMH: Past Medical History:  Diagnosis Date   Arthritis    Hyperlipidemia    Hypertension    Hypothyroidism    Paroxysmal atrial fibrillation (HCC) 11/22/2014   Thrombocytopenia (HCC) 05/23/2015    Surgical History: Past Surgical History:  Procedure Laterality Date   bilateral carotid duplex ultrasound showed no ICA     stenosis   CARDIAC CATHETERIZATION     Dr. Jenne Campus which showed a 95% ostial LAD stenosis   CORONARY ARTERY BYPASS GRAFT     x3 using a left internal mammary artery   PACEMAKER GENERATOR CHANGE N/A 08/09/2014   Procedure: PACEMAKER GENERATOR CHANGE Donny Pique;  Surgeon: Thurmon Fair, MD;  Location: [facility] CATH [facility];  Service: Cardiovascular;  Laterality: N/A;   Placement of permanent pacemaker     US ECHOCARDIOGRAPHY     lft ventricular ejection  fraction was about 60%    Home Medications:  Allergies as of 02/06/2024       Reactions   Latex Itching   PT STATES THAT AFTER WEARING LATEX GLOVES TO WORK IN THAT HIS HANDS WERE VERY ITCHY.   Sulfa Antibiotics Rash        Medication List        Accurate as of February 06, 2024  5:39 PM. If you have any questions, ask your nurse or doctor.          cyanocobalamin 1000 MCG tablet Commonly known as: VITAMIN B12 Take 1,000 mcg by mouth daily.   Eliquis 5 MG Tabs tablet Generic drug: apixaban TAKE ONE (1) TABLET BY MOUTH TWO TIMES PER DAY   liothyronine 5 MCG tablet Commonly known as: CYTOMEL Take 5 mcg by mouth daily.   metoprolol succinate 25 MG 24 hr tablet Commonly known as: TOPROL-XL TAKE 1 TABLET BY MOUTH DAILY   rosuvastatin 10 MG tablet Commonly known as: CRESTOR TAKE 1 TABLET BY MOUTH DAILY   Synthroid 100 MCG tablet Generic drug: levothyroxine Take 100 mg by mouth daily.        Allergies:  Allergies  Allergen Reactions   Latex Itching    PT STATES THAT AFTER WEARING LATEX GLOVES TO WORK IN THAT HIS HANDS WERE VERY ITCHY.   Sulfa Antibiotics Rash    Family History: Family History  Problem Relation Age of Onset   Hypertension Mother  Stroke Mother     Social History:  reports that he has never smoked. He has never used smokeless tobacco. He reports current alcohol use. He reports that he does not use drugs.   Physical Exam: BP 134/75   Pulse 81   Ht 5\' 10"  (1.778 m)   Wt 170 lb (77.1 kg)   BMI 24.39 kg/m   Constitutional:  Alert and oriented, No acute distress. HEENT: [place] AT. Respiratory: Normal respiratory effort, no increased work of breathing. Psychiatric: Normal mood and affect.  Urinalysis Dipstick trace blood, microscopy negative.   Assessment & Plan:    1. BPH with LUTS PVR 0 mL We discussed tamsulosin is a first-line choice in the management of BPH and would recommend he continue, especially since he is not having any  orthostatic symptoms.  He desires a 1 year follow-up for a recheck  2. Nocturia Nocturia x 3-4 He was offered a trial of intermediate-relief trospium 20 mg at bedtime, but states his nocturia is not bothersome enough that he desires medication.  I have reviewed the above documentation for accuracy and completeness, and I agree with the above.   Ian Altes, MD  [facility] [address], [address] [place], [place] [address] [phone]

## 2024-02-10 ENCOUNTER — Ambulatory Visit: Admitting: Podiatry

## 2024-02-10 DIAGNOSIS — L03032 Cellulitis of left toe: Secondary | ICD-10-CM

## 2024-02-10 MED ORDER — DOXYCYCLINE HYCLATE 100 MG PO TABS: 100.0000 mg | ORAL_TABLET | Freq: Two times a day (BID) | ORAL | 0 refills | Status: DC

## 2024-02-10 NOTE — Progress Notes (Signed)
  Subjective:  Patient ID: [name], male    DOB: Apr 13, 1933,  MRN: [id number]  Chief Complaint  Patient presents with   Nail Problem    89 y.o. male presents with the above complaint.  Patient presents with left hallux paronychia with a history of total nail avulsion.  He wanted to get it evaluated make sure is healing well denies any other acute complaints pain scale is 5 out of 10 dull aching nature is still little raw.  Has been doing his soaks denies any other acute issues.   Review of Systems: Negative except as noted in the HPI. Denies N/V/F/Ch.  Past Medical History:  Diagnosis Date   Arthritis    Hyperlipidemia    Hypertension    Hypothyroidism    Paroxysmal atrial fibrillation (HCC) 11/22/2014   Thrombocytopenia (HCC) 05/23/2015    Current Outpatient Medications:    doxycycline (VIBRA-TABS) 100 MG tablet, Take 1 tablet (100 mg total) by mouth 2 (two) times daily., Disp: 20 tablet, Rfl: 0   cyanocobalamin (VITAMIN B12) 1000 MCG tablet, Take 1,000 mcg by mouth daily., Disp: , Rfl:    ELIQUIS 5 MG TABS tablet, TAKE ONE (1) TABLET BY MOUTH TWO TIMES PER DAY, Disp: 60 tablet, Rfl: 5   liothyronine (CYTOMEL) 5 MCG tablet, Take 5 mcg by mouth daily., Disp: , Rfl:    metoprolol succinate (TOPROL-XL) 25 MG 24 hr tablet, TAKE 1 TABLET BY MOUTH DAILY, Disp: 90 tablet, Rfl: 2   rosuvastatin (CRESTOR) 10 MG tablet, TAKE 1 TABLET BY MOUTH DAILY, Disp: 90 tablet, Rfl: 3   SYNTHROID 100 MCG tablet, Take 100 mg by mouth daily., Disp: , Rfl:   Social History   Tobacco Use  Smoking Status Never  Smokeless Tobacco Never    Allergies  Allergen Reactions   Latex Itching    PT STATES THAT AFTER WEARING LATEX GLOVES TO WORK IN THAT HIS HANDS WERE VERY ITCHY.   Sulfa Antibiotics Rash   Objective:  There were no vitals filed for this visit. There is no height or weight on file to calculate BMI. Constitutional Well developed. Well nourished.  Vascular Dorsalis pedis pulses palpable  bilaterally. Posterior tibial pulses palpable bilaterally. Capillary refill normal to all digits.  No cyanosis or clubbing noted. Pedal hair growth normal.  Neurologic Normal speech. Oriented to person, place, and time. Epicritic sensation to light touch grossly present bilaterally.  Dermatologic Nails left hallux paronychia noted without purulent drainage.  History of total nail avulsion noted no abscess noted. Skin within normal limits  Orthopedic: Normal joint ROM without pain or crepitus bilaterally. No visible deformities. No bony tenderness.   Radiographs: None Assessment:   1. Paronychia of toenail of left foot    Plan:  Patient was evaluated and treated and all questions answered.  Left hallux paronychia with history of total nail avulsion - All questions and concerns were discussed with the patient extensive detail encouraged her to stop doing Epsom salt soaks to keep it dry to allow the wound to heal appropriately.  He states understanding.  Due to his age he may be a slow healer. - I will place him on doxycycline for skin and soft tissue prophylaxis. - I will see her back again in 3 weeks if there is no improvement  No follow-ups on file.

## 2024-02-24 ENCOUNTER — Ambulatory Visit: Admitting: Podiatry

## 2024-02-24 DIAGNOSIS — L97521 Non-pressure chronic ulcer of other part of left foot limited to breakdown of skin: Secondary | ICD-10-CM

## 2024-02-24 NOTE — Progress Notes (Signed)
  Subjective:  Patient ID: [name], male    DOB: Mar 19, 1933,  MRN: [id number]  Chief Complaint  Patient presents with   Nail Problem    Paronychia of toenail of left foot    89 y.o. male presents with the above complaint.  Patient presents with left hallux paronychia with a history of total nail avulsion.  He wanted to get it evaluated make sure is healing well denies any other acute complaints pain scale is 5 out of 10 dull aching nature is still little raw.  Has been doing his soaks denies any other acute issues.   Review of Systems: Negative except as noted in the HPI. Denies N/V/F/Ch.  Past Medical History:  Diagnosis Date   Arthritis    Hyperlipidemia    Hypertension    Hypothyroidism    Paroxysmal atrial fibrillation (HCC) 11/22/2014   Thrombocytopenia (HCC) 05/23/2015    Current Outpatient Medications:    cyanocobalamin (VITAMIN B12) 1000 MCG tablet, Take 1,000 mcg by mouth daily., Disp: , Rfl:    doxycycline (VIBRA-TABS) 100 MG tablet, Take 1 tablet (100 mg total) by mouth 2 (two) times daily., Disp: 20 tablet, Rfl: 0   ELIQUIS 5 MG TABS tablet, TAKE ONE (1) TABLET BY MOUTH TWO TIMES PER DAY, Disp: 60 tablet, Rfl: 5   liothyronine (CYTOMEL) 5 MCG tablet, Take 5 mcg by mouth daily., Disp: , Rfl:    metoprolol succinate (TOPROL-XL) 25 MG 24 hr tablet, TAKE 1 TABLET BY MOUTH DAILY, Disp: 90 tablet, Rfl: 2   rosuvastatin (CRESTOR) 10 MG tablet, TAKE 1 TABLET BY MOUTH DAILY, Disp: 90 tablet, Rfl: 3   SYNTHROID 100 MCG tablet, Take 100 mg by mouth daily., Disp: , Rfl:   Social History   Tobacco Use  Smoking Status Never  Smokeless Tobacco Never    Allergies  Allergen Reactions   Latex Itching    PT STATES THAT AFTER WEARING LATEX GLOVES TO WORK IN THAT HIS HANDS WERE VERY ITCHY.   Sulfa Antibiotics Rash   Objective:  There were no vitals filed for this visit. There is no height or weight on file to calculate BMI. Constitutional Well developed. Well nourished.   Vascular Dorsalis pedis pulses palpable bilaterally. Posterior tibial pulses palpable bilaterally. Capillary refill normal to all digits.  No cyanosis or clubbing noted. Pedal hair growth normal.  Neurologic Normal speech. Oriented to person, place, and time. Epicritic sensation to light touch grossly present bilaterally.  Dermatologic Left paronychia resolved noted without purulent drainage.  History of total nail avulsion noted no abscess noted. Skin within normal limits  Orthopedic: Normal joint ROM without pain or crepitus bilaterally. No visible deformities. No bony tenderness.   Radiographs: None Assessment:   1. Toe ulcer, left, limited to breakdown of skin [facility])     Plan:  Patient was evaluated and treated and all questions answered.  Left hallux paronychia with history of total nail avulsion - All questions and concerns were discussed with the patient extensive detail continue using iodine the wound itself is closing down and regressing it is a slow healing wound likely due to the deformity of the second digit.  The second digit is overlapping against the hallux causing pressure.  I encouraged toe separators as well he states understanding - No further antibiotics are indicated as there is no paronychia that has resolved  No follow-ups on file.

## 2024-03-10 ENCOUNTER — Ambulatory Visit: Payer: Medicare PPO

## 2024-03-10 DIAGNOSIS — I495 Sick sinus syndrome: Secondary | ICD-10-CM

## 2024-03-11 ENCOUNTER — Telehealth: Payer: Self-pay

## 2024-03-11 LAB — CUP PACEART REMOTE DEVICE CHECK
Battery Impedance: 1446 Ohm
Battery Remaining Longevity: 49 mo
Battery Voltage: 2.76 V
Brady Statistic AP VP Percent: 1 %
Brady Statistic AP VS Percent: 69 %
Brady Statistic AS VP Percent: 0 %
Brady Statistic AS VS Percent: 29 %
Date Time Interrogation Session: 20250430163127
Implantable Lead Connection Status: 753985
Implantable Lead Connection Status: 753985
Implantable Lead Implant Date: 20071115
Implantable Lead Implant Date: 20071115
Implantable Lead Location: 753859
Implantable Lead Location: 753860
Implantable Lead Model: 4092
Implantable Lead Model: 5076
Implantable Pulse Generator Implant Date: 20150929
Lead Channel Impedance Value: 524 Ohm
Lead Channel Impedance Value: 620 Ohm
Lead Channel Pacing Threshold Amplitude: 0.375 V
Lead Channel Pacing Threshold Amplitude: 0.5 V
Lead Channel Pacing Threshold Pulse Width: 0.4 ms
Lead Channel Pacing Threshold Pulse Width: 0.4 ms
Lead Channel Setting Pacing Amplitude: 2 V
Lead Channel Setting Pacing Amplitude: 2.5 V
Lead Channel Setting Pacing Pulse Width: 0.4 ms
Lead Channel Setting Sensing Sensitivity: 5.6 mV
Zone Setting Status: 755011
Zone Setting Status: 755011

## 2024-03-11 NOTE — Telephone Encounter (Addendum)
 Alert received from [employer] for   2 VHR. Longest 16 sec. Fastest V-rate 180bpm. Collection is set for atrial EGM. 1 EGM shows V-V irregularity.  1 EGM from 02/28/24 @ 250pm shows oversensing of noise on RV lead.   Attempted to contact patient/wife. No answer, LMTCB.

## 2024-03-11 NOTE — Telephone Encounter (Signed)
 He has normal LVEF and no known CAD, recent normal nuclear study. No new recommendations. Will change to V EGM at next appointment. Thanks.

## 2024-03-11 NOTE — Telephone Encounter (Signed)
 Do not see any noise noted.   Spoke to patient who states he may have been using a Surveyor, mining but denies any symptoms.

## 2024-03-13 ENCOUNTER — Encounter: Payer: Self-pay | Admitting: Cardiovascular Disease

## 2024-03-18 ENCOUNTER — Other Ambulatory Visit: Payer: Self-pay | Admitting: *Deleted

## 2024-03-18 DIAGNOSIS — I480 Paroxysmal atrial fibrillation: Secondary | ICD-10-CM

## 2024-03-18 MED ORDER — APIXABAN 5 MG PO TABS: 5.0000 mg | ORAL_TABLET | Freq: Two times a day (BID) | ORAL | 5 refills | Status: DC

## 2024-03-18 NOTE — Telephone Encounter (Signed)
 Prescription refill request for Eliquis  received. Indication: AFIB  Last office visit: [place] 07/24/2023 Scr: 1.3, 12/19/2023 Age: 89 yo  Weight: 77.1 kg   Refill sent.

## 2024-03-23 ENCOUNTER — Ambulatory Visit: Admitting: Podiatry

## 2024-04-22 NOTE — Progress Notes (Signed)
 Remote pacemaker transmission.

## 2024-04-22 NOTE — Addendum Note (Signed)
 Addended by: Lott Rouleau A on: 04/22/2024 02:20 PM   Modules accepted: Orders

## 2024-04-26 ENCOUNTER — Other Ambulatory Visit
Admission: RE | Admit: 2024-04-26 | Discharge: 2024-04-26 | Disposition: A | Discharge status: Home / Self Care | Source: Ambulatory Visit | Attending: Family Medicine | Admitting: Family Medicine

## 2024-04-26 DIAGNOSIS — R079 Chest pain, unspecified: Secondary | ICD-10-CM | POA: Diagnosis present

## 2024-04-26 LAB — TROPONIN I (HIGH SENSITIVITY): Troponin I (High Sensitivity): 11 ng/L (ref <18)

## 2024-04-29 ENCOUNTER — Telehealth: Payer: Self-pay

## 2024-04-29 NOTE — Telephone Encounter (Signed)
 Pt called in stating he has not been feeling well and wants to send in a transmission to see if it is heart related. Pt would like a call back once received

## 2024-04-29 NOTE — Telephone Encounter (Signed)
 Remote transmission received. Presenting rhythm is AF w/ RVR then converts to NSR.   Patient reports he feels better at the moment but has not over the past few days. Compliant with toprol -XL 25 mg daily. Does report skipping Eliquis  5 mg BID at times. Stressed compliance with not skipping OAC. Pt voiced understanding.   Recommend AF clinic referral to patient to discuss AF w/ RVR further. agreeable and will be expecting call for apt.   Pt advised to call if worsening symptoms. Voiced understanding.

## 2024-05-10 ENCOUNTER — Ambulatory Visit (HOSPITAL_COMMUNITY)
Admission: RE | Admit: 2024-05-10 | Discharge: 2024-05-10 | Disposition: A | Discharge status: Home / Self Care | Source: Ambulatory Visit | Attending: Internal Medicine | Admitting: Internal Medicine

## 2024-05-10 VITALS — BP 136/76 | HR 70 | Ht 70.0 in | Wt 170.0 lb

## 2024-05-10 DIAGNOSIS — I4891 Unspecified atrial fibrillation: Secondary | ICD-10-CM

## 2024-05-10 DIAGNOSIS — D6869 Other thrombophilia: Secondary | ICD-10-CM | POA: Diagnosis not present

## 2024-05-10 DIAGNOSIS — I480 Paroxysmal atrial fibrillation: Secondary | ICD-10-CM

## 2024-05-10 MED ORDER — METOPROLOL TARTRATE 25 MG PO TABS: 12.5000 mg | ORAL_TABLET | Freq: Two times a day (BID) | ORAL | 1 refills | Status: DC | PRN

## 2024-05-10 NOTE — Progress Notes (Addendum)
 Primary Care Physician: Rudolpho Norleen BIRCH, MD Primary Cardiologist: Jerel Balding, MD Electrophysiologist: None     Referring Physician: Device clinic     [name] is a 89 y.o. male with a history of CAD s/p CABG, sinus node dysfunction s/p PPM 2007, HTN, SVT, and paroxysmal atrial fibrillation who presents for consultation in the [facility]. Patient contacted device clinic 6/19 noting to feel unwell and device interrogation showed Afib with RVR then converted to NSR. Also noted patient saw PCP same day and prescribed doxycycline  for URI. Patient is on Eliquis  5 mg BID for a CHADS2VASC score of 4.  On evaluation today, he is currently in Afib. Review of remote interrogation today shows PAF with recent increased burden. Previous interrogation of device on 5/1 showed stable overall burden of 6% PAF. He just finished his doxycycline  course of 100 mg BID x 10 days today. No missed doses of Eliquis .   Today, he denies symptoms of palpitations, chest pain, shortness of breath, orthopnea, PND, lower extremity edema, dizziness, presyncope, syncope, snoring, daytime somnolence, bleeding, or neurologic sequela. The patient is tolerating medications without difficulties and is otherwise without complaint today.    he has a BMI of Body mass index is 24.39 kg/m.SABRA Filed Weights   05/10/24 1457  Weight: 77.1 kg    Current Outpatient Medications  Medication Sig Dispense Refill   apixaban  (ELIQUIS ) 5 MG TABS tablet Take 1 tablet (5 mg total) by mouth 2 (two) times daily. 60 tablet 5   cyanocobalamin (VITAMIN B12) 1000 MCG tablet Take 1,000 mcg by mouth daily.     doxycycline  (VIBRA -TABS) 100 MG tablet Take 1 tablet (100 mg total) by mouth 2 (two) times daily. 20 tablet 0   liothyronine (CYTOMEL) 5 MCG tablet Take 5 mcg by mouth daily.     metoprolol  succinate (TOPROL -XL) 25 MG 24 hr tablet TAKE 1 TABLET BY MOUTH DAILY 90 tablet 2   rosuvastatin  (CRESTOR ) 10 MG  tablet TAKE 1 TABLET BY MOUTH DAILY 90 tablet 3   SYNTHROID 100 MCG tablet Take 100 mg by mouth daily.     No current facility-administered medications for this encounter.    Atrial Fibrillation Management history:  Previous antiarrhythmic drugs: none Previous cardioversions: none Previous ablations: none Anticoagulation history: Eliquis    ROS- All systems are reviewed and negative except as per the HPI above.  Physical Exam: BP 136/76   Pulse 70   Ht 5' 10 (1.778 m)   Wt 77.1 kg   BMI 24.39 kg/m   GEN: Well nourished, well developed in no acute distress NECK: No JVD; No carotid bruits CARDIAC: Irregularly irregular rate and rhythm, no murmurs, rubs, gallops RESPIRATORY:  Rhonchi bilaterally that clears with cough ABDOMEN: Soft, non-tender, non-distended EXTREMITIES:  No edema; No deformity   EKG today demonstrates  Vent. rate 70 BPM PR interval * ms QRS duration 90 ms QT/QTcB 386/416 ms P-R-T axes * 97 21 Atrial fibrillation with premature ventricular or aberrantly conducted complexes and with ventricular escape complexes Rightward axis Abnormal ECG When compared with ECG of 24-Jul-2023 09:01, Atrial fibrillation has replaced Electronic atrial pacemaker  Echo 05/03/22 demonstrated  1. Left ventricular ejection fraction, by estimation, is 60 to 65%. The  left ventricle has normal function. The left ventricle has no regional  wall motion abnormalities. There is moderate asymmetric left ventricular  hypertrophy of the basal-septal  segment. Left ventricular diastolic parameters are consistent with Grade  II diastolic dysfunction (pseudonormalization).  2. Right ventricular systolic function is normal. The right ventricular  size is normal. There is mildly elevated pulmonary artery systolic  pressure.   3. Left atrial size was moderately dilated.   4. The mitral valve is degenerative. Mild mitral valve regurgitation. No  evidence of mitral stenosis.   5.  Tricuspid valve regurgitation is mild to moderate.   6. The aortic valve is tricuspid. There is mild calcification of the  aortic valve. Aortic valve regurgitation is mild. Aortic valve  sclerosis/calcification is present, without any evidence of aortic  stenosis.   7. Aortic dilatation noted. There is borderline dilatation of the aortic  root, measuring 39 mm.   8. The inferior vena cava is normal in size with greater than 50%  respiratory variability, suggesting right atrial pressure of 3 mmHg.    ASSESSMENT & PLAN CHA2DS2-VASc Score = 4  The patient's score is based upon: CHF History: 0 HTN History: 1 Diabetes History: 0 Stroke History: 0 Vascular Disease History: 1 Age Score: 2 Gender Score: 0       ASSESSMENT AND PLAN: Paroxysmal Atrial Fibrillation (ICD10:  I48.0) The patient's CHA2DS2-VASc score is 4, indicating a 4.8% annual risk of stroke.    He is currently in Afib. We discussed recent increase in burden likely secondary to ongoing URI. He appears to be in Afib for longer periods of time during ongoing URI. Will prescribe Lopressor  PRN palpitations but needs to monitor BP/HR. Continue Toprol  25 mg daily. We did briefly discuss AAD therapy if increased burden of Afib is noted despite resolution of illness. If this were to be the case going forward, would discuss with patient's primary cardiologist.  Secondary Hypercoagulable State (ICD10:  [phone]) The patient is at significant risk for stroke/thromboembolism based upon his CHA2DS2-VASc Score of 4.  Continue Apixaban  (Eliquis ).  Continue Eliquis  5 mg BID; dosage correct based on weight > 60 kg and creatinine 1.0.      Follow up Afib clinic prn.   Terra Pac, PA-C  Afib Clinic [facility] [address] [place], [place] [address] [phone]

## 2024-05-10 NOTE — Patient Instructions (Addendum)
 Call office if needed [phone]  May take metoprolol  12.5 mg ( half of tablet) if needed for palpitations

## 2024-06-09 ENCOUNTER — Ambulatory Visit: Attending: Cardiovascular Disease

## 2024-06-09 DIAGNOSIS — I495 Sick sinus syndrome: Secondary | ICD-10-CM

## 2024-06-11 ENCOUNTER — Telehealth: Payer: Self-pay | Admitting: *Deleted

## 2024-06-11 LAB — CUP PACEART REMOTE DEVICE CHECK
Battery Impedance: 1589 Ohm
Battery Remaining Longevity: 44 mo
Battery Voltage: 2.76 V
Brady Statistic AP VP Percent: 1 %
Brady Statistic AP VS Percent: 67 %
Brady Statistic AS VP Percent: 0 %
Brady Statistic AS VS Percent: 32 %
Date Time Interrogation Session: 20250731145728
Implantable Lead Connection Status: 753985
Implantable Lead Connection Status: 753985
Implantable Lead Implant Date: 20071115
Implantable Lead Implant Date: 20071115
Implantable Lead Location: 753859
Implantable Lead Location: 753860
Implantable Lead Model: 4092
Implantable Lead Model: 5076
Implantable Pulse Generator Implant Date: 20150929
Lead Channel Impedance Value: 481 Ohm
Lead Channel Impedance Value: 588 Ohm
Lead Channel Pacing Threshold Amplitude: 0.375 V
Lead Channel Pacing Threshold Amplitude: 0.5 V
Lead Channel Pacing Threshold Pulse Width: 0.4 ms
Lead Channel Pacing Threshold Pulse Width: 0.4 ms
Lead Channel Setting Pacing Amplitude: 2 V
Lead Channel Setting Pacing Amplitude: 2.5 V
Lead Channel Setting Pacing Pulse Width: 0.4 ms
Lead Channel Setting Sensing Sensitivity: 5.6 mV
Zone Setting Status: 755011
Zone Setting Status: 755011

## 2024-06-11 NOTE — Telephone Encounter (Signed)
 Scheduled remote received from [employer]:  PPM Scheduled remote reviewed. Normal device function.  Presenting rhythm:  AF/AFL/VS AF/AFL in progress from 7/31,  overall controlled ventricular rates, burden 12.5%, 4 brief HVR's c/w AF with rapid ventricular response, Eliquis  per EPIC - route to triage per protocol Next remote 91 days. LA, CV[facility]  ____________________________________________________________________________  Patient has hx of being symptomatic during AF  Called patient to assess for symptoms. No answer. LMTCB  Called patient's spouse. No answer. LMTCB  Patient seen in AF clinic 05/10/24  Since then, AF burden increased from 7.8% ------> 12.5%   Patient called back during documentation   Patient stated that he has had an increase in fatigue since his visit on 05/10/24   Patient stated since then he has taken maybe 5 doses of his PRN Lopressor  medication   Routing to J. Terra in AF clinic for review and awareness   05/10/24 Transmission    06/11/24 Transmission

## 2024-06-15 ENCOUNTER — Ambulatory Visit: Payer: Self-pay | Admitting: Cardiovascular Disease

## 2024-07-01 ENCOUNTER — Encounter: Payer: Self-pay | Admitting: Cardiovascular Disease

## 2024-07-01 ENCOUNTER — Ambulatory Visit: Attending: Cardiovascular Disease | Admitting: Cardiovascular Disease

## 2024-07-01 VITALS — BP 132/70 | HR 74 | Ht 68.0 in | Wt 170.5 lb

## 2024-07-01 DIAGNOSIS — D6869 Other thrombophilia: Secondary | ICD-10-CM

## 2024-07-01 DIAGNOSIS — I495 Sick sinus syndrome: Secondary | ICD-10-CM

## 2024-07-01 DIAGNOSIS — E039 Hypothyroidism, unspecified: Secondary | ICD-10-CM

## 2024-07-01 DIAGNOSIS — Z01812 Encounter for preprocedural laboratory examination: Secondary | ICD-10-CM

## 2024-07-01 DIAGNOSIS — Z950 Presence of cardiac pacemaker: Secondary | ICD-10-CM

## 2024-07-01 DIAGNOSIS — I4819 Other persistent atrial fibrillation: Secondary | ICD-10-CM

## 2024-07-01 DIAGNOSIS — I10 Essential (primary) hypertension: Secondary | ICD-10-CM

## 2024-07-01 DIAGNOSIS — I25810 Atherosclerosis of coronary artery bypass graft(s) without angina pectoris: Secondary | ICD-10-CM

## 2024-07-01 NOTE — Patient Instructions (Addendum)
 Medication Instructions:  No changes *If you need a refill on your cardiac medications before your next appointment, please call your pharmacy*  Lab Work: No changes If you have labs (blood work) drawn today and your tests are completely normal, you will receive your results only by: MyChart Message (if you have MyChart) OR A paper copy in the mail If you have any lab test that is abnormal or we need to change your treatment, we will call you to review the results.  Testing/Procedures:    Dear [name]  You are scheduled for a Cardioversion on Wednesday, September 17 with Dr. Francyne.  Please arrive at the [facility] (Main Entrance A) at [facility]: [address] [place], [place] [address] at 8:00 AM (This time is 1 hour(s) before your procedure to ensure your preparation).   Free valet parking service is available. You will check in at ADMITTING.   *Please Note: You will receive a call the day before your procedure to confirm the appointment time. That time may have changed from the original time based on the schedule for that day.*   DIET:  Nothing to eat or drink after midnight except a sip of water with medications (see medication instructions below)  MEDICATION INSTRUCTIONS: !!IF ANY NEW MEDICATIONS ARE STARTED AFTER TODAY, PLEASE NOTIFY YOUR PROVIDER AS SOON AS POSSIBLE!!  FYI: Medications such as Semaglutide (Ozempic, [place]), Tirzepatide (Mounjaro, Zepbound), Dulaglutide (Trulicity), etc (GLP1 agonists) AND Canagliflozin (Invokana), Dapagliflozin (Farxiga), Empagliflozin (Jardiance), Ertugliflozin (Steglatro), Bexagliflozin [employer]) or any combination with one of these drugs such as Invokamet (Canagliflozin/Metformin), Synjardy (Empagliflozin/Metformin), etc (SGLT2 inhibitors) must be held around the time of a procedure. This is not a comprehensive list of all of these drugs. Please review all of your medications and talk to your provider if you take any one  of these. If you are not sure, ask your provider.  Continue taking your anticoagulant (blood thinner): Apixaban  (Eliquis ).  You will need to continue this after your procedure until you are told by your provider that it is safe to stop.    LABS:  Labs CBC, BMP, TSH  FYI:  For your safety, and to allow us  to monitor your vital signs accurately during the surgery/procedure we request: If you have artificial nails, gel coating, SNS etc, please have those removed prior to your surgery/procedure. Not having the nail coverings /polish removed may result in cancellation or delay of your surgery/procedure.  Your support person will be asked to wait in the waiting room during your procedure.  It is OK to have someone drop you off and come back when you are ready to be discharged.  You cannot drive after the procedure and will need someone to drive you home.  Bring your insurance cards.  *Special Note: Every effort is made to have your procedure done on time. Occasionally there are emergencies that occur at the hospital that may cause delays. Please be patient if a delay does occur.      Follow-Up: At [facility], you and your health needs are our priority.  As part of our continuing mission to provide you with exceptional heart care, our providers are all part of one team.  This team includes your primary Cardiologist (physician) and Advanced Practice Providers or APPs (Physician Assistants and Nurse Practitioners) who all work together to provide you with the care you need, when you need it.  Your next appointment:   Will be scheduled after Cardioversion  Provider:  Jerel Balding, MD    We recommend signing up for the patient portal called MyChart.  Sign up information is provided on this After Visit Summary.  MyChart is used to connect with patients for Virtual Visits (Telemedicine).  Patients are able to view lab/test results, encounter notes, upcoming appointments, etc.   Non-urgent messages can be sent to your provider as well.   To learn more about what you can do with MyChart, go to [email or web address].

## 2024-07-01 NOTE — Progress Notes (Signed)
 Cardiology Office Note    Date:  07/01/2024   ID:  [name], DOB 12-16-1932, MRN [id number]  PCP:  Rudolpho Norleen BIRCH, MD  Cardiologist:   Jerel Balding, MD   Chief Complaint  Patient presents with   Atrial Fibrillation    History of Present Illness:  [name] is a 89 y.o. male with CAD s/p CABG, sinus node dysfunction and paroxysmal atrial fibrillation, status post dual-chamber permanent pacemaker implantation in 2007 (generator change 2015, Medtronic Adapta) returning for follow-up.   He has no particular palpitations, but is definitely had more fatigue over the last approximately 1 month.  Remains NYHA functional class II.  He has not had orthopnea, PND or lower extremity edema and does not complain of chest pain.  He has not had dizziness or syncope.  Pacemaker interrogation shows a marked increase in the burden of atrial fibrillation and he has actually been in persistent atrial fibrillation for roughly 1 month now.  He has good ventricular rate control with very rare episodes of RVR.  He had a very brief episodes of nonsustained VT in April 2025.  All lead parameters remain stable (atrial pacing threshold could not be tested today).  His pacemaker generator reports roughly 4 years of remaining longevity.  He is not pacemaker dependent.  He rarely requires ventricular pacing, although the burden of ventricular pacing has increased while he is in atrial fibrillation.  Prior to the onset of persistent atrial fibrillation he had atrial pacing roughly 3/4 of the time.  ECG shows very coarse atrial fibrillation versus atypical atrial flutter.  He still has the original leads from 2007 (atrial lead Medtronic [id number], ventricularly Medtronic [id number]) with excellent parameters.  The ventricular lead is not MRI conditional.  Heart rate histogram distribution is appropriate.    It's been about 17 years since he underwent bypass surgery.  It is important to note that he had no angina pectoris  before bypass.  He experienced fatigue and dyspnea, feeling washed out with work-up eventually leading to bypass surgery.  We rechecked an echocardiogram and a Lexiscan  Myoview  in June 2023.  He had a normal pattern of myocardial perfusion.  LV systolic function was normal by both echo and quantitative scintigraphy.  His left atrium was moderately dilated.  As before he has a borderline elevated pulmonary artery pressure of about 38 mmHg, without any abnormalities of the right heart.  The ascending aorta is borderline dilated, unchanged from previous echo.  He was poorly tolerant of rosuvastatin  due to myopathy symptoms when he was taken on a daily basis.  Continues to take it every other day and is tolerating it fine.  Normal left ventricular systolic function by echo most recently performed in 2014. Last nuclear stress test in 2012 was normal. He underwent bypass surgery in 2007 (Dr. Lucas 2007, LIMA to LAD, SVG to OM, SVG to RCA). His pacemaker is a Medtronic Adapta dual-chamber device implanted in 2015 (leads from 2007). He has treated hyperlipidemia, followed by his primary care physician. He has mild thrombocytopenia without bleeding complications.  Past Medical History:  Diagnosis Date   Arthritis    Hyperlipidemia    Hypertension    Hypothyroidism    Paroxysmal atrial fibrillation (HCC) 11/22/2014   Thrombocytopenia (HCC) 05/23/2015    Past Surgical History:  Procedure Laterality Date   bilateral carotid duplex ultrasound showed no ICA     stenosis   CARDIAC CATHETERIZATION     Dr. Herminio which showed a  95% ostial LAD stenosis   CORONARY ARTERY BYPASS GRAFT     x3 using a left internal mammary artery   PACEMAKER GENERATOR CHANGE N/A 08/09/2014   Procedure: PACEMAKER GENERATOR CHANGE CHADWICK;  Surgeon: Jerel Balding, MD;  Location: [facility] CATH LAB;  Service: Cardiovascular;  Laterality: N/A;   Placement of permanent pacemaker     US  ECHOCARDIOGRAPHY     lft ventricular ejection  fraction was about 60%    Current Medications: Outpatient Medications Prior to Visit  Medication Sig Dispense Refill   apixaban  (ELIQUIS ) 5 MG TABS tablet Take 1 tablet (5 mg total) by mouth 2 (two) times daily. 60 tablet 5   cyanocobalamin (VITAMIN B12) 1000 MCG tablet Take 1,000 mcg by mouth daily.     metoprolol  succinate (TOPROL -XL) 25 MG 24 hr tablet TAKE 1 TABLET BY MOUTH DAILY 90 tablet 2   omeprazole (PRILOSEC OTC) 20 MG tablet Take 20 mg by mouth daily.     rosuvastatin  (CRESTOR ) 10 MG tablet TAKE 1 TABLET BY MOUTH DAILY 90 tablet 3   liothyronine (CYTOMEL) 5 MCG tablet Take 5 mcg by mouth daily.     metoprolol  tartrate (LOPRESSOR ) 25 MG tablet Take 0.5 tablets (12.5 mg total) by mouth 2 (two) times daily as needed (take as needed for palpitations). (Patient not taking: Reported on 07/01/2024) 60 tablet 1   SYNTHROID 100 MCG tablet Take 100 mg by mouth daily.     No facility-administered medications prior to visit.     Allergies:   Latex and Sulfa antibiotics   Social History   Socioeconomic History   Marital status: Married    Spouse name: Not on file   Number of children: Not on file   Years of education: Not on file   Highest education level: Not on file  Occupational History   Not on file  Tobacco Use   Smoking status: Never   Smokeless tobacco: Never  Substance and Sexual Activity   Alcohol use: Yes    Comment: occas.   Drug use: No   Sexual activity: Not on file  Other Topics Concern   Not on file  Social History Narrative   Not on file   Social Drivers of Health   Financial Resource Strain: Low Risk  (06/24/2024)   Received from [facility]   Overall Financial Resource Strain (CARDIA)    Difficulty of Paying Living Expenses: Not hard at all  Food Insecurity: No Food Insecurity (06/24/2024)   Received from [facility]   Hunger Vital Sign    Within the past 12 months, you worried that your food would run out before you  got the money to buy more.: Never true    Within the past 12 months, the food you bought just didn't last and you didn't have money to get more.: Never true  Transportation Needs: No Transportation Needs (06/24/2024)   Received from [facility] - Transportation    In the past 12 months, has lack of transportation kept you from medical appointments or from getting medications?: No    Lack of Transportation (Non-Medical): No  Physical Activity: Not on file  Stress: Not on file  Social Connections: Not on file     Family History:  The patient's family history includes Hypertension in his mother; Stroke in his mother.   ROS:   Please see the history of present illness.    ROS All other systems are reviewed and are negative.  PHYSICAL EXAM:   VS:  BP 132/70 (BP Location: Left Arm, Patient Position: Sitting, Cuff Size: Normal)   Pulse 74   Ht 5' 8 (1.727 m)   Wt 170 lb 8 oz (77.3 kg)   SpO2 94%   BMI 25.92 kg/m      General: Alert, oriented x3, no distress, healthy left subclavian pacemaker site Head: no evidence of trauma, PERRL, EOMI, no exophtalmos or lid lag, no myxedema, no xanthelasma; normal ears, nose and oropharynx Neck: normal jugular venous pulsations and no hepatojugular reflux; brisk carotid pulses without delay and no carotid bruits Chest: clear to auscultation, no signs of consolidation by percussion or palpation, normal fremitus, symmetrical and full respiratory excursions Cardiovascular: normal position and quality of the apical impulse, irregular rhythm, normal first and second heart sounds, no murmurs, rubs or gallops Abdomen: no tenderness or distention, no masses by palpation, no abnormal pulsatility or arterial bruits, normal bowel sounds, no hepatosplenomegaly Extremities: no clubbing, cyanosis or edema; 2+ radial, ulnar and brachial pulses bilaterally; 2+ right femoral, posterior tibial and dorsalis pedis pulses; 2+ left femoral,  posterior tibial and dorsalis pedis pulses; no subclavian or femoral bruits Neurological: grossly nonfocal Psych: Normal mood and affect    Wt Readings from Last 3 Encounters:  07/01/24 170 lb 8 oz (77.3 kg)  05/10/24 170 lb (77.1 kg)  02/06/24 170 lb (77.1 kg)      Studies/Labs Reviewed:  04/22/2022 Lexiscan  Myoview    Findings are consistent with no prior ischemia and no prior myocardial infarction. The study is low risk.   No ST deviation was noted.   LV perfusion is normal. There is no evidence of ischemia.   Left ventricular function is abnormal. There was a single regional abnormality. Nuclear stress EF: 55 %. The left ventricular ejection fraction is normal (55-65%). End diastolic cavity size is normal. End systolic cavity size is normal.   Prior study available for comparison from 02/11/2011.   05/03/2022 echocardiogram   1. Left ventricular ejection fraction, by estimation, is 60 to 65%. The  left ventricle has normal function. The left ventricle has no regional  wall motion abnormalities. There is moderate asymmetric left ventricular  hypertrophy of the basal-septal  segment. Left ventricular diastolic parameters are consistent with Grade  II diastolic dysfunction (pseudonormalization).   2. Right ventricular systolic function is normal. The right ventricular  size is normal. There is mildly elevated pulmonary artery systolic  pressure.   3. Left atrial size was moderately dilated.   4. The mitral valve is degenerative. Mild mitral valve regurgitation. No  evidence of mitral stenosis.   5. Tricuspid valve regurgitation is mild to moderate.   6. The aortic valve is tricuspid. There is mild calcification of the  aortic valve. Aortic valve regurgitation is mild. Aortic valve  sclerosis/calcification is present, without any evidence of aortic  stenosis.   7. Aortic dilatation noted. There is borderline dilatation of the aortic  root, measuring 39 mm.   8. The inferior  vena cava is normal in size with greater than 50%  respiratory variability, suggesting right atrial pressure of 3 mmHg.   Comparison(s): 05/11/13 EF 55-60%. PA pressure .   EKG:    EKG Interpretation Date/Time:  Thursday July 01 2024 09:47:19 EDT Ventricular Rate:  74 PR Interval:    QRS Duration:  88 QT Interval:  382 QTC Calculation: 424 R Axis:   95  Text Interpretation: Atrial flutter with variable A-V block Rightward axis Nonspecific T wave abnormality When  compared with ECG of 10-May-2024 15:11, Atrial flutter has replaced Atrial fibrillation Inverted T waves have replaced nonspecific T wave abnormality in Inferior leads Nonspecific T wave abnormality now evident in Anterior leads Confirmed by Eyla Tallon [phone]) on 07/01/2024 2:14:42 PM         Recent Labs: 06/17/2023 Cholesterol 139, triglycerides 63, HDL 53, LDL 73 Hemoglobin 84.8, potassium 4.7, creatinine 1.2, normal liver function tests TSH was mildly elevated at 7.181 (in February it was 5.013). TSH 4.186, hemoglobin 14.8, potassium 4.6, creatinine 1.1, normal liver function tests  06/17/2024 Hemoglobin 14.8, creatinine 1.2, potassium 5.0, normal liver function tests Cholesterol 141, triglycerides 75, HDL 53, LDL 73 TSH 5.637  Lipid Panel     Component Value Date/Time   CHOL 162 09/24/2021 0845   TRIG 89 09/24/2021 0845   HDL 52 09/24/2021 0845   CHOLHDL 3.1 09/24/2021 0845   LDLCALC 93 09/24/2021 0845   LABVLDL 17 09/24/2021 0845   BMET    Component Value Date/Time   NA 140 09/24/2021 0845   K 4.6 09/24/2021 0845   CL 102 09/24/2021 0845   CO2 25 09/24/2021 0845   GLUCOSE 88 09/24/2021 0845   GLUCOSE 92 01/13/2010 0628   BUN 12 09/24/2021 0845   CREATININE 1.06 09/24/2021 0845   CALCIUM  9.1 09/24/2021 0845   EGFR 68 09/24/2021 0845   GFRNONAA 55 (L) 12/09/2017 0901    ASSESSMENT:    1. Persistent atrial fibrillation (HCC)   2. Pre-procedure lab exam   3. Coronary artery disease  involving coronary bypass graft of native heart without angina pectoris   4. Acquired thrombophilia (HCC)   5. SSS (sick sinus syndrome) (HCC)   6. Pacemaker   7. Essential hypertension   8. Acquired hypothyroidism       PLAN:  In order of problems listed above:  AFib: This is the first time he has had persistent atrial fibrillation.  Rate control is satisfactory.  He does not have overt heart failure, but his functional status has deteriorated.  Plan cardioversion.  If there is early recurrence of persistent atrial fibrillation discussed options for ablation versus antiarrhythmic medications, versus conservative management in view of the fact that he is almost 89 years old.  He thinks he has missed some episodes of Eliquis  in the last couple of weeks.  Scheduled for cardioversion in about 3 weeks.  CHADSVasc 4 (age 23, HTN, CAD).  CAD s/p CABG: He is asymptomatic from this point of view.  He never really had angina before the detection of CAD and CABG.   Low risk recent nuclear stress test in June 2023.  Normal left ventricular systolic function. Eliquis : Denies bleeding complications and falls. Discussed the critical importance of uninterrupted anticoagulation in the weeks leading up to and following cardioversion.   SSS: Good heart rate histogram distribution with current sensor settings, prior to the onset of persistent atrial fibrillation. PPM: Normal pacemaker function today.  Continue remote downloads every 3 months.  He has to go to an Internet store to do his downloads, since he has poor reception at home. HTN: Well-controlled.  Continue same medications. HLP: Target LDL under 70; most recently was close to that at 73.  Continue rosuvastatin . Hypothyroidism: Mildly elevated TSH earlier this month, but he appears clinically euthyroid.  Recheck that since you are planning a cardioversion.   Informed Consent   Shared Decision Making/Informed Consent The risks (stroke, cardiac  arrhythmias rarely resulting in the need for a temporary or permanent pacemaker, skin  irritation or burns and complications associated with conscious sedation including aspiration, arrhythmia, respiratory failure and death), benefits (restoration of normal sinus rhythm) and alternatives of a direct current cardioversion were explained in detail to [name] and he agrees to proceed.         Medication Adjustments/Labs and Tests Ordered: Current medicines are reviewed at length with the patient today.  Concerns regarding medicines are outlined above.  Medication changes, Labs and Tests ordered today are listed in the Patient Instructions below. Patient Instructions  Medication Instructions:  No changes *If you need a refill on your cardiac medications before your next appointment, please call your pharmacy*  Lab Work: No changes If you have labs (blood work) drawn today and your tests are completely normal, you will receive your results only by: MyChart Message (if you have MyChart) OR A paper copy in the mail If you have any lab test that is abnormal or we need to change your treatment, we will call you to review the results.  Testing/Procedures:    Dear [name]  You are scheduled for a Cardioversion on Wednesday, September 17 with Dr. Francyne.  Please arrive at the [facility] (Main Entrance A) at [facility]: [address] [place], [place] [address] at 8:00 AM (This time is 1 hour(s) before your procedure to ensure your preparation).   Free valet parking service is available. You will check in at ADMITTING.   *Please Note: You will receive a call the day before your procedure to confirm the appointment time. That time may have changed from the original time based on the schedule for that day.*   DIET:  Nothing to eat or drink after midnight except a sip of water with medications (see medication instructions below)  MEDICATION INSTRUCTIONS: !!IF ANY NEW MEDICATIONS ARE  STARTED AFTER TODAY, PLEASE NOTIFY YOUR PROVIDER AS SOON AS POSSIBLE!!  FYI: Medications such as Semaglutide (Ozempic, [place]), Tirzepatide (Mounjaro, Zepbound), Dulaglutide (Trulicity), etc (GLP1 agonists) AND Canagliflozin (Invokana), Dapagliflozin (Farxiga), Empagliflozin (Jardiance), Ertugliflozin (Steglatro), Bexagliflozin [employer]) or any combination with one of these drugs such as Invokamet (Canagliflozin/Metformin), Synjardy (Empagliflozin/Metformin), etc (SGLT2 inhibitors) must be held around the time of a procedure. This is not a comprehensive list of all of these drugs. Please review all of your medications and talk to your provider if you take any one of these. If you are not sure, ask your provider.  Continue taking your anticoagulant (blood thinner): Apixaban  (Eliquis ).  You will need to continue this after your procedure until you are told by your provider that it is safe to stop.    LABS:  Labs CBC, BMP, TSH  FYI:  For your safety, and to allow us  to monitor your vital signs accurately during the surgery/procedure we request: If you have artificial nails, gel coating, SNS etc, please have those removed prior to your surgery/procedure. Not having the nail coverings /polish removed may result in cancellation or delay of your surgery/procedure.  Your support person will be asked to wait in the waiting room during your procedure.  It is OK to have someone drop you off and come back when you are ready to be discharged.  You cannot drive after the procedure and will need someone to drive you home.  Bring your insurance cards.  *Special Note: Every effort is made to have your procedure done on time. Occasionally there are emergencies that occur at the hospital that may cause delays. Please be patient if a delay does occur.  Follow-Up: At [facility], you and your health needs are our priority.  As part of our continuing mission to provide you with exceptional heart  care, our providers are all part of one team.  This team includes your primary Cardiologist (physician) and Advanced Practice Providers or APPs (Physician Assistants and Nurse Practitioners) who all work together to provide you with the care you need, when you need it.  Your next appointment:   Will be scheduled after Cardioversion  Provider:   Jerel Balding, MD    We recommend signing up for the patient portal called MyChart.  Sign up information is provided on this After Visit Summary.  MyChart is used to connect with patients for Virtual Visits (Telemedicine).  Patients are able to view lab/test results, encounter notes, upcoming appointments, etc.  Non-urgent messages can be sent to your provider as well.   To learn more about what you can do with MyChart, go to [email or web address].          Signed, Jerel Balding, MD  07/01/2024 2:24 PM    [facility] [address] [place], [place], [place]  [address] Phone: [phone]; Fax: [phone]

## 2024-07-01 NOTE — H&P (View-Only) (Signed)
 Cardiology Office Note    Date:  07/01/2024   ID:  [name], DOB 12-16-1932, MRN [id number]  PCP:  Rudolpho Norleen BIRCH, MD  Cardiologist:   Jerel Balding, MD   Chief Complaint  Patient presents with   Atrial Fibrillation    History of Present Illness:  [name] is a 89 y.o. male with CAD s/p CABG, sinus node dysfunction and paroxysmal atrial fibrillation, status post dual-chamber permanent pacemaker implantation in 2007 (generator change 2015, Medtronic Adapta) returning for follow-up.   He has no particular palpitations, but is definitely had more fatigue over the last approximately 1 month.  Remains NYHA functional class II.  He has not had orthopnea, PND or lower extremity edema and does not complain of chest pain.  He has not had dizziness or syncope.  Pacemaker interrogation shows a marked increase in the burden of atrial fibrillation and he has actually been in persistent atrial fibrillation for roughly 1 month now.  He has good ventricular rate control with very rare episodes of RVR.  He had a very brief episodes of nonsustained VT in April 2025.  All lead parameters remain stable (atrial pacing threshold could not be tested today).  His pacemaker generator reports roughly 4 years of remaining longevity.  He is not pacemaker dependent.  He rarely requires ventricular pacing, although the burden of ventricular pacing has increased while he is in atrial fibrillation.  Prior to the onset of persistent atrial fibrillation he had atrial pacing roughly 3/4 of the time.  ECG shows very coarse atrial fibrillation versus atypical atrial flutter.  He still has the original leads from 2007 (atrial lead Medtronic [id number], ventricularly Medtronic [id number]) with excellent parameters.  The ventricular lead is not MRI conditional.  Heart rate histogram distribution is appropriate.    It's been about 17 years since he underwent bypass surgery.  It is important to note that he had no angina pectoris  before bypass.  He experienced fatigue and dyspnea, feeling washed out with work-up eventually leading to bypass surgery.  We rechecked an echocardiogram and a Lexiscan  Myoview  in June 2023.  He had a normal pattern of myocardial perfusion.  LV systolic function was normal by both echo and quantitative scintigraphy.  His left atrium was moderately dilated.  As before he has a borderline elevated pulmonary artery pressure of about 38 mmHg, without any abnormalities of the right heart.  The ascending aorta is borderline dilated, unchanged from previous echo.  He was poorly tolerant of rosuvastatin  due to myopathy symptoms when he was taken on a daily basis.  Continues to take it every other day and is tolerating it fine.  Normal left ventricular systolic function by echo most recently performed in 2014. Last nuclear stress test in 2012 was normal. He underwent bypass surgery in 2007 (Dr. Lucas 2007, LIMA to LAD, SVG to OM, SVG to RCA). His pacemaker is a Medtronic Adapta dual-chamber device implanted in 2015 (leads from 2007). He has treated hyperlipidemia, followed by his primary care physician. He has mild thrombocytopenia without bleeding complications.  Past Medical History:  Diagnosis Date   Arthritis    Hyperlipidemia    Hypertension    Hypothyroidism    Paroxysmal atrial fibrillation (HCC) 11/22/2014   Thrombocytopenia (HCC) 05/23/2015    Past Surgical History:  Procedure Laterality Date   bilateral carotid duplex ultrasound showed no ICA     stenosis   CARDIAC CATHETERIZATION     Dr. Herminio which showed a  95% ostial LAD stenosis   CORONARY ARTERY BYPASS GRAFT     x3 using a left internal mammary artery   PACEMAKER GENERATOR CHANGE N/A 08/09/2014   Procedure: PACEMAKER GENERATOR CHANGE CHADWICK;  Surgeon: Jerel Balding, MD;  Location: [facility] CATH LAB;  Service: Cardiovascular;  Laterality: N/A;   Placement of permanent pacemaker     US  ECHOCARDIOGRAPHY     lft ventricular ejection  fraction was about 60%    Current Medications: Outpatient Medications Prior to Visit  Medication Sig Dispense Refill   apixaban  (ELIQUIS ) 5 MG TABS tablet Take 1 tablet (5 mg total) by mouth 2 (two) times daily. 60 tablet 5   cyanocobalamin (VITAMIN B12) 1000 MCG tablet Take 1,000 mcg by mouth daily.     metoprolol  succinate (TOPROL -XL) 25 MG 24 hr tablet TAKE 1 TABLET BY MOUTH DAILY 90 tablet 2   omeprazole (PRILOSEC OTC) 20 MG tablet Take 20 mg by mouth daily.     rosuvastatin  (CRESTOR ) 10 MG tablet TAKE 1 TABLET BY MOUTH DAILY 90 tablet 3   liothyronine (CYTOMEL) 5 MCG tablet Take 5 mcg by mouth daily.     metoprolol  tartrate (LOPRESSOR ) 25 MG tablet Take 0.5 tablets (12.5 mg total) by mouth 2 (two) times daily as needed (take as needed for palpitations). (Patient not taking: Reported on 07/01/2024) 60 tablet 1   SYNTHROID 100 MCG tablet Take 100 mg by mouth daily.     No facility-administered medications prior to visit.     Allergies:   Latex and Sulfa antibiotics   Social History   Socioeconomic History   Marital status: Married    Spouse name: Not on file   Number of children: Not on file   Years of education: Not on file   Highest education level: Not on file  Occupational History   Not on file  Tobacco Use   Smoking status: Never   Smokeless tobacco: Never  Substance and Sexual Activity   Alcohol use: Yes    Comment: occas.   Drug use: No   Sexual activity: Not on file  Other Topics Concern   Not on file  Social History Narrative   Not on file   Social Drivers of Health   Financial Resource Strain: Low Risk  (06/24/2024)   Received from [facility]   Overall Financial Resource Strain (CARDIA)    Difficulty of Paying Living Expenses: Not hard at all  Food Insecurity: No Food Insecurity (06/24/2024)   Received from [facility]   Hunger Vital Sign    Within the past 12 months, you worried that your food would run out before you  got the money to buy more.: Never true    Within the past 12 months, the food you bought just didn't last and you didn't have money to get more.: Never true  Transportation Needs: No Transportation Needs (06/24/2024)   Received from [facility] - Transportation    In the past 12 months, has lack of transportation kept you from medical appointments or from getting medications?: No    Lack of Transportation (Non-Medical): No  Physical Activity: Not on file  Stress: Not on file  Social Connections: Not on file     Family History:  The patient's family history includes Hypertension in his mother; Stroke in his mother.   ROS:   Please see the history of present illness.    ROS All other systems are reviewed and are negative.  PHYSICAL EXAM:   VS:  BP 132/70 (BP Location: Left Arm, Patient Position: Sitting, Cuff Size: Normal)   Pulse 74   Ht 5' 8 (1.727 m)   Wt 170 lb 8 oz (77.3 kg)   SpO2 94%   BMI 25.92 kg/m      General: Alert, oriented x3, no distress, healthy left subclavian pacemaker site Head: no evidence of trauma, PERRL, EOMI, no exophtalmos or lid lag, no myxedema, no xanthelasma; normal ears, nose and oropharynx Neck: normal jugular venous pulsations and no hepatojugular reflux; brisk carotid pulses without delay and no carotid bruits Chest: clear to auscultation, no signs of consolidation by percussion or palpation, normal fremitus, symmetrical and full respiratory excursions Cardiovascular: normal position and quality of the apical impulse, irregular rhythm, normal first and second heart sounds, no murmurs, rubs or gallops Abdomen: no tenderness or distention, no masses by palpation, no abnormal pulsatility or arterial bruits, normal bowel sounds, no hepatosplenomegaly Extremities: no clubbing, cyanosis or edema; 2+ radial, ulnar and brachial pulses bilaterally; 2+ right femoral, posterior tibial and dorsalis pedis pulses; 2+ left femoral,  posterior tibial and dorsalis pedis pulses; no subclavian or femoral bruits Neurological: grossly nonfocal Psych: Normal mood and affect    Wt Readings from Last 3 Encounters:  07/01/24 170 lb 8 oz (77.3 kg)  05/10/24 170 lb (77.1 kg)  02/06/24 170 lb (77.1 kg)      Studies/Labs Reviewed:  04/22/2022 Lexiscan  Myoview    Findings are consistent with no prior ischemia and no prior myocardial infarction. The study is low risk.   No ST deviation was noted.   LV perfusion is normal. There is no evidence of ischemia.   Left ventricular function is abnormal. There was a single regional abnormality. Nuclear stress EF: 55 %. The left ventricular ejection fraction is normal (55-65%). End diastolic cavity size is normal. End systolic cavity size is normal.   Prior study available for comparison from 02/11/2011.   05/03/2022 echocardiogram   1. Left ventricular ejection fraction, by estimation, is 60 to 65%. The  left ventricle has normal function. The left ventricle has no regional  wall motion abnormalities. There is moderate asymmetric left ventricular  hypertrophy of the basal-septal  segment. Left ventricular diastolic parameters are consistent with Grade  II diastolic dysfunction (pseudonormalization).   2. Right ventricular systolic function is normal. The right ventricular  size is normal. There is mildly elevated pulmonary artery systolic  pressure.   3. Left atrial size was moderately dilated.   4. The mitral valve is degenerative. Mild mitral valve regurgitation. No  evidence of mitral stenosis.   5. Tricuspid valve regurgitation is mild to moderate.   6. The aortic valve is tricuspid. There is mild calcification of the  aortic valve. Aortic valve regurgitation is mild. Aortic valve  sclerosis/calcification is present, without any evidence of aortic  stenosis.   7. Aortic dilatation noted. There is borderline dilatation of the aortic  root, measuring 39 mm.   8. The inferior  vena cava is normal in size with greater than 50%  respiratory variability, suggesting right atrial pressure of 3 mmHg.   Comparison(s): 05/11/13 EF 55-60%. PA pressure .   EKG:    EKG Interpretation Date/Time:  Thursday July 01 2024 09:47:19 EDT Ventricular Rate:  74 PR Interval:    QRS Duration:  88 QT Interval:  382 QTC Calculation: 424 R Axis:   95  Text Interpretation: Atrial flutter with variable A-V block Rightward axis Nonspecific T wave abnormality When  compared with ECG of 10-May-2024 15:11, Atrial flutter has replaced Atrial fibrillation Inverted T waves have replaced nonspecific T wave abnormality in Inferior leads Nonspecific T wave abnormality now evident in Anterior leads Confirmed by Eyla Tallon [phone]) on 07/01/2024 2:14:42 PM         Recent Labs: 06/17/2023 Cholesterol 139, triglycerides 63, HDL 53, LDL 73 Hemoglobin 84.8, potassium 4.7, creatinine 1.2, normal liver function tests TSH was mildly elevated at 7.181 (in February it was 5.013). TSH 4.186, hemoglobin 14.8, potassium 4.6, creatinine 1.1, normal liver function tests  06/17/2024 Hemoglobin 14.8, creatinine 1.2, potassium 5.0, normal liver function tests Cholesterol 141, triglycerides 75, HDL 53, LDL 73 TSH 5.637  Lipid Panel     Component Value Date/Time   CHOL 162 09/24/2021 0845   TRIG 89 09/24/2021 0845   HDL 52 09/24/2021 0845   CHOLHDL 3.1 09/24/2021 0845   LDLCALC 93 09/24/2021 0845   LABVLDL 17 09/24/2021 0845   BMET    Component Value Date/Time   NA 140 09/24/2021 0845   K 4.6 09/24/2021 0845   CL 102 09/24/2021 0845   CO2 25 09/24/2021 0845   GLUCOSE 88 09/24/2021 0845   GLUCOSE 92 01/13/2010 0628   BUN 12 09/24/2021 0845   CREATININE 1.06 09/24/2021 0845   CALCIUM  9.1 09/24/2021 0845   EGFR 68 09/24/2021 0845   GFRNONAA 55 (L) 12/09/2017 0901    ASSESSMENT:    1. Persistent atrial fibrillation (HCC)   2. Pre-procedure lab exam   3. Coronary artery disease  involving coronary bypass graft of native heart without angina pectoris   4. Acquired thrombophilia (HCC)   5. SSS (sick sinus syndrome) (HCC)   6. Pacemaker   7. Essential hypertension   8. Acquired hypothyroidism       PLAN:  In order of problems listed above:  AFib: This is the first time he has had persistent atrial fibrillation.  Rate control is satisfactory.  He does not have overt heart failure, but his functional status has deteriorated.  Plan cardioversion.  If there is early recurrence of persistent atrial fibrillation discussed options for ablation versus antiarrhythmic medications, versus conservative management in view of the fact that he is almost 89 years old.  He thinks he has missed some episodes of Eliquis  in the last couple of weeks.  Scheduled for cardioversion in about 3 weeks.  CHADSVasc 4 (age 23, HTN, CAD).  CAD s/p CABG: He is asymptomatic from this point of view.  He never really had angina before the detection of CAD and CABG.   Low risk recent nuclear stress test in June 2023.  Normal left ventricular systolic function. Eliquis : Denies bleeding complications and falls. Discussed the critical importance of uninterrupted anticoagulation in the weeks leading up to and following cardioversion.   SSS: Good heart rate histogram distribution with current sensor settings, prior to the onset of persistent atrial fibrillation. PPM: Normal pacemaker function today.  Continue remote downloads every 3 months.  He has to go to an Internet store to do his downloads, since he has poor reception at home. HTN: Well-controlled.  Continue same medications. HLP: Target LDL under 70; most recently was close to that at 73.  Continue rosuvastatin . Hypothyroidism: Mildly elevated TSH earlier this month, but he appears clinically euthyroid.  Recheck that since you are planning a cardioversion.   Informed Consent   Shared Decision Making/Informed Consent The risks (stroke, cardiac  arrhythmias rarely resulting in the need for a temporary or permanent pacemaker, skin  irritation or burns and complications associated with conscious sedation including aspiration, arrhythmia, respiratory failure and death), benefits (restoration of normal sinus rhythm) and alternatives of a direct current cardioversion were explained in detail to Mr. Sauceda and he agrees to proceed.         Medication Adjustments/Labs and Tests Ordered: Current medicines are reviewed at length with the patient today.  Concerns regarding medicines are outlined above.  Medication changes, Labs and Tests ordered today are listed in the Patient Instructions below. Patient Instructions  Medication Instructions:  No changes *If you need a refill on your cardiac medications before your next appointment, please call your pharmacy*  Lab Work: No changes If you have labs (blood work) drawn today and your tests are completely normal, you will receive your results only by: MyChart Message (if you have MyChart) OR A paper copy in the mail If you have any lab test that is abnormal or we need to change your treatment, we will call you to review the results.  Testing/Procedures:    Dear Tanda FORBES Jama  You are scheduled for a Cardioversion on Wednesday, September 17 with Dr. Francyne.  Please arrive at the [facility] (Main Entrance A) at [facility]: [address] [place], [place] [address] at 8:00 AM (This time is 1 hour(s) before your procedure to ensure your preparation).   Free valet parking service is available. You will check in at ADMITTING.   *Please Note: You will receive a call the day before your procedure to confirm the appointment time. That time may have changed from the original time based on the schedule for that day.*   DIET:  Nothing to eat or drink after midnight except a sip of water with medications (see medication instructions below)  MEDICATION INSTRUCTIONS: !!IF ANY NEW MEDICATIONS ARE  STARTED AFTER TODAY, PLEASE NOTIFY YOUR PROVIDER AS SOON AS POSSIBLE!!  FYI: Medications such as Semaglutide (Ozempic, [place]), Tirzepatide (Mounjaro, Zepbound), Dulaglutide (Trulicity), etc (GLP1 agonists) AND Canagliflozin (Invokana), Dapagliflozin (Farxiga), Empagliflozin (Jardiance), Ertugliflozin (Steglatro), Bexagliflozin [employer]) or any combination with one of these drugs such as Invokamet (Canagliflozin/Metformin), Synjardy (Empagliflozin/Metformin), etc (SGLT2 inhibitors) must be held around the time of a procedure. This is not a comprehensive list of all of these drugs. Please review all of your medications and talk to your provider if you take any one of these. If you are not sure, ask your provider.  Continue taking your anticoagulant (blood thinner): Apixaban  (Eliquis ).  You will need to continue this after your procedure until you are told by your provider that it is safe to stop.    LABS:  Labs CBC, BMP, TSH  FYI:  For your safety, and to allow us  to monitor your vital signs accurately during the surgery/procedure we request: If you have artificial nails, gel coating, SNS etc, please have those removed prior to your surgery/procedure. Not having the nail coverings /polish removed may result in cancellation or delay of your surgery/procedure.  Your support person will be asked to wait in the waiting room during your procedure.  It is OK to have someone drop you off and come back when you are ready to be discharged.  You cannot drive after the procedure and will need someone to drive you home.  Bring your insurance cards.  *Special Note: Every effort is made to have your procedure done on time. Occasionally there are emergencies that occur at the hospital that may cause delays. Please be patient if a delay does occur.  Follow-Up: At [facility], you and your health needs are our priority.  As part of our continuing mission to provide you with exceptional heart  care, our providers are all part of one team.  This team includes your primary Cardiologist (physician) and Advanced Practice Providers or APPs (Physician Assistants and Nurse Practitioners) who all work together to provide you with the care you need, when you need it.  Your next appointment:   Will be scheduled after Cardioversion  Provider:   Jerel Balding, MD    We recommend signing up for the patient portal called MyChart.  Sign up information is provided on this After Visit Summary.  MyChart is used to connect with patients for Virtual Visits (Telemedicine).  Patients are able to view lab/test results, encounter notes, upcoming appointments, etc.  Non-urgent messages can be sent to your provider as well.   To learn more about what you can do with MyChart, go to [email or web address].          Signed, Jerel Balding, MD  07/01/2024 2:24 PM    [facility] [address] [place], [place], [place]  [address] Phone: [phone]; Fax: [phone]

## 2024-07-02 ENCOUNTER — Ambulatory Visit: Payer: Self-pay | Admitting: Cardiovascular Disease

## 2024-07-02 LAB — BASIC METABOLIC PANEL WITH GFR
BUN/Creatinine Ratio: 10 (ref 10–24)
BUN: 12 mg/dL (ref 8–27)
CO2: 21 mmol/L (ref 20–29)
Calcium: 9.2 mg/dL (ref 8.6–10.2)
Chloride: 101 mmol/L (ref 96–106)
Creatinine, Ser: 1.23 mg/dL (ref 0.76–1.27)
Glucose: 116 mg/dL — ABNORMAL HIGH (ref 70–99)
Potassium: 5 mmol/L (ref 3.5–5.2)
Sodium: 138 mmol/L (ref 134–144)
eGFR: 56 mL/min/1.73 — ABNORMAL LOW (ref >59)

## 2024-07-02 LAB — CBC
Hematocrit: 47.6 % (ref 37.5–51.0)
Hemoglobin: 15.2 g/dL (ref 13.0–17.7)
MCH: 30.1 pg (ref 26.6–33.0)
MCHC: 31.9 g/dL (ref 31.5–35.7)
MCV: 94 fL (ref 79–97)
Platelets: 189 x10E3/uL (ref 150–450)
RBC: 5.05 x10E6/uL (ref 4.14–5.80)
RDW: 13.1 % (ref 11.6–15.4)
WBC: 7.1 x10E3/uL (ref 3.4–10.8)

## 2024-07-02 LAB — TSH: TSH: 3.65 u[IU]/mL (ref 0.450–4.500)

## 2024-07-07 ENCOUNTER — Ambulatory Visit (HOSPITAL_COMMUNITY): Admitting: Internal Medicine

## 2024-07-13 ENCOUNTER — Telehealth: Payer: Self-pay

## 2024-07-13 NOTE — Telephone Encounter (Signed)
 Call received from Pt.  Pt wanted to confirm arrival time for DCCV.  Advised 8:00 am arrival time.  No further questions.

## 2024-07-22 ENCOUNTER — Encounter: Admitting: Cardiovascular Disease

## 2024-07-27 NOTE — Progress Notes (Signed)
 Left message for return call to review instructions for appointment on 07/27/24.

## 2024-07-27 NOTE — Anesthesia Preprocedure Evaluation (Addendum)
 Anesthesia Evaluation  Patient identified by MRN, date of birth, ID band Patient awake    Reviewed: Allergy & Precautions, H&P , NPO status , Patient's Chart, lab work & pertinent test results  Airway Mallampati: II  TM Distance: >3 FB Neck ROM: Full    Dental no notable dental hx. (+) Upper Dentures, Dental Advisory Given   Pulmonary neg pulmonary ROS   Pulmonary exam normal breath sounds clear to auscultation       Cardiovascular Exercise Tolerance: Good hypertension, Pt. on medications and Pt. on home beta blockers + CAD and + CABG  + dysrhythmias Atrial Fibrillation + pacemaker  Rhythm:Irregular Rate:Tachycardia     Neuro/Psych negative neurological ROS  negative psych ROS   GI/Hepatic negative GI ROS, Neg liver ROS,,,  Endo/Other  Hypothyroidism    Renal/GU negative Renal ROS  negative genitourinary   Musculoskeletal  (+) Arthritis , Osteoarthritis,    Abdominal   Peds  Hematology negative hematology ROS (+)   Anesthesia Other Findings   Reproductive/Obstetrics negative OB ROS                              Anesthesia Physical Anesthesia Plan  ASA: 3  Anesthesia Plan: General   Post-op Pain Management: Minimal or no pain anticipated   Induction: Intravenous  PONV Risk Score and Plan: 2 and Propofol  infusion and Treatment may vary due to age or medical condition  Airway Management Planned: Natural Airway and Nasal Cannula  Additional Equipment:   Intra-op Plan:   Post-operative Plan:   Informed Consent: I have reviewed the patients History and Physical, chart, labs and discussed the procedure including the risks, benefits and alternatives for the proposed anesthesia with the patient or authorized representative who has indicated his/her understanding and acceptance.     Dental advisory given  Plan Discussed with: CRNA  Anesthesia Plan Comments:           Anesthesia Quick Evaluation

## 2024-07-28 ENCOUNTER — Ambulatory Visit (HOSPITAL_COMMUNITY)
Admission: RE | Admit: 2024-07-28 | Discharge: 2024-07-28 | Disposition: A | Discharge status: Home / Self Care | Attending: Cardiovascular Disease | Admitting: Cardiovascular Disease

## 2024-07-28 ENCOUNTER — Ambulatory Visit (HOSPITAL_BASED_OUTPATIENT_CLINIC_OR_DEPARTMENT_OTHER): Payer: Self-pay | Admitting: Anesthesiology

## 2024-07-28 ENCOUNTER — Ambulatory Visit (HOSPITAL_COMMUNITY): Payer: Self-pay | Admitting: Anesthesiology

## 2024-07-28 ENCOUNTER — Other Ambulatory Visit: Payer: Self-pay

## 2024-07-28 ENCOUNTER — Encounter (HOSPITAL_COMMUNITY)
Admission: RE | Disposition: A | Discharge status: Home / Self Care | Payer: Self-pay | Source: Home / Self Care | Attending: Cardiovascular Disease

## 2024-07-28 DIAGNOSIS — I10 Essential (primary) hypertension: Secondary | ICD-10-CM | POA: Diagnosis not present

## 2024-07-28 DIAGNOSIS — Z7901 Long term (current) use of anticoagulants: Secondary | ICD-10-CM | POA: Diagnosis not present

## 2024-07-28 DIAGNOSIS — I4819 Other persistent atrial fibrillation: Secondary | ICD-10-CM | POA: Diagnosis not present

## 2024-07-28 DIAGNOSIS — E039 Hypothyroidism, unspecified: Secondary | ICD-10-CM

## 2024-07-28 DIAGNOSIS — I25810 Atherosclerosis of coronary artery bypass graft(s) without angina pectoris: Secondary | ICD-10-CM | POA: Insufficient documentation

## 2024-07-28 DIAGNOSIS — I4891 Unspecified atrial fibrillation: Secondary | ICD-10-CM

## 2024-07-28 DIAGNOSIS — I119 Hypertensive heart disease without heart failure: Secondary | ICD-10-CM | POA: Diagnosis not present

## 2024-07-28 DIAGNOSIS — I2510 Atherosclerotic heart disease of native coronary artery without angina pectoris: Secondary | ICD-10-CM | POA: Insufficient documentation

## 2024-07-28 DIAGNOSIS — E785 Hyperlipidemia, unspecified: Secondary | ICD-10-CM | POA: Insufficient documentation

## 2024-07-28 DIAGNOSIS — I495 Sick sinus syndrome: Secondary | ICD-10-CM | POA: Diagnosis not present

## 2024-07-28 DIAGNOSIS — Z950 Presence of cardiac pacemaker: Secondary | ICD-10-CM | POA: Insufficient documentation

## 2024-07-28 DIAGNOSIS — Z45018 Encounter for adjustment and management of other part of cardiac pacemaker: Secondary | ICD-10-CM | POA: Diagnosis not present

## 2024-07-28 DIAGNOSIS — I480 Paroxysmal atrial fibrillation: Secondary | ICD-10-CM

## 2024-07-28 DIAGNOSIS — D6859 Other primary thrombophilia: Secondary | ICD-10-CM | POA: Insufficient documentation

## 2024-07-28 DIAGNOSIS — Z951 Presence of aortocoronary bypass graft: Secondary | ICD-10-CM | POA: Diagnosis not present

## 2024-07-28 DIAGNOSIS — Z79899 Other long term (current) drug therapy: Secondary | ICD-10-CM | POA: Insufficient documentation

## 2024-07-28 HISTORY — PX: CARDIOVERSION: EP1203

## 2024-07-28 SURGERY — CARDIOVERSION (CATH LAB)
Anesthesia: General

## 2024-07-28 MED ORDER — SODIUM CHLORIDE 0.9% FLUSH: 3.0000 mL | INTRAVENOUS | Status: DC | PRN

## 2024-07-28 MED ORDER — SODIUM CHLORIDE 0.9 % IV SOLN: 250.0000 mL | INTRAVENOUS | Status: DC | PRN

## 2024-07-28 MED ORDER — LIDOCAINE 2% (20 MG/ML) 5 ML SYRINGE
INTRAMUSCULAR | Status: DC | PRN
  Administered 2024-07-28: 60 mg via INTRAVENOUS

## 2024-07-28 MED ORDER — APIXABAN 5 MG PO TABS
5.0000 mg | ORAL_TABLET | Freq: Once | ORAL | Status: AC
  Administered 2024-07-28: 5 mg via ORAL

## 2024-07-28 MED ORDER — SODIUM CHLORIDE 0.9% FLUSH: 3.0000 mL | Freq: Two times a day (BID) | INTRAVENOUS | Status: DC

## 2024-07-28 MED ORDER — APIXABAN 5 MG PO TABS
ORAL_TABLET | ORAL | Status: AC
  Filled 2024-07-28: qty 1

## 2024-07-28 MED ORDER — PROPOFOL 10 MG/ML IV BOLUS
INTRAVENOUS | Status: DC | PRN
Start: 2024-07-28 — End: 2024-07-28
  Administered 2024-07-28: 40 mg via INTRAVENOUS

## 2024-07-28 SURGICAL SUPPLY — 1 items: PAD DEFIB RADIO PHYSIO CONN (PAD) ×2 IMPLANT

## 2024-07-28 NOTE — Op Note (Signed)
 Procedure: Electrical Cardioversion Indications:  Atrial Fibrillation  Procedure Details:  Consent: Risks of procedure as well as the alternatives and risks of each were explained to the (patient/caregiver).  Consent for procedure obtained.  Time Out: Verified patient identification, verified procedure, site/side was marked, verified correct patient position, special equipment/implants available, medications/allergies/relevent history reviewed, required imaging and test results available.  Performed  Patient placed on cardiac monitor, pulse oximetry, supplemental oxygen as necessary.  Sedation given: propofol  40 mg IV, Dr. Epifanio Pacer pads placed anterior and posterior chest.  Cardioverted 1 time(s).  Cardioversion with synchronized biphasic 200J shock.  Evaluation: Findings: Post procedure EKG shows: A paced, V sensed, PACS Complications: None Patient did tolerate procedure well.  Normal pacemaker function at baseline. After the procedure, recheck of generator function, lead impedance, sensing and capture threshold unchanged in both channels. Sensed P waves 2.0-2.8 mV. Atrial preference pacing programmed On at 95 bpm due tovery frequent PACs and brief bursts of atrial tachycardia.  Time Spent Directly with the Patient:  30 minutes   Dorthula Bier 07/28/2024, 9:28 AM

## 2024-07-28 NOTE — Discharge Instructions (Signed)

## 2024-07-28 NOTE — Anesthesia Postprocedure Evaluation (Signed)
 Anesthesia Post Note  Patient: [name]  Procedure(s) Performed: CARDIOVERSION     Patient location during evaluation: Cath Lab Anesthesia Type: General Level of consciousness: awake and alert Pain management: pain level controlled Vital Signs Assessment: post-procedure vital signs reviewed and stable Respiratory status: spontaneous breathing, nonlabored ventilation and respiratory function stable Cardiovascular status: blood pressure returned to baseline and stable Postop Assessment: no apparent nausea or vomiting Anesthetic complications: no   No notable events documented.  Last Vitals:  Vitals:   07/28/24 0935 07/28/24 0940  BP: 134/62 112/65  Pulse: 97 95  Resp: (!) 28 (!) 22  Temp:  36.7 C  SpO2: 96% 95%    Last Pain:  Vitals:   07/28/24 0940  TempSrc: Temporal  PainSc: 0-No pain                 Liane Tribbey,W. EDMOND

## 2024-07-28 NOTE — Anesthesia Procedure Notes (Signed)
 Procedure Name: MAC Date/Time: 07/28/2024 8:50 AM  Performed by: Viviana Almarie DASEN, CRNAPre-anesthesia Checklist: Timeout performed, Patient identified, Emergency Drugs available, Suction available and Patient being monitored Patient Re-evaluated:Patient Re-evaluated prior to induction Oxygen Delivery Method: Nasal cannula Induction Type: IV induction

## 2024-07-28 NOTE — Transfer of Care (Signed)
 Immediate Anesthesia Transfer of Care Note  Patient: [name]  Procedure(s) Performed: CARDIOVERSION  Patient Location: Cath Lab  Anesthesia Type:MAC  Level of Consciousness: awake, alert , oriented, and patient cooperative  Airway & Oxygen Therapy: Patient Spontanous Breathing and Patient connected to nasal cannula oxygen  Post-op Assessment: Report given to RN, Post -op Vital signs reviewed and stable, and Patient moving all extremities X 4  Post vital signs: Reviewed and stable  Last Vitals:  Vitals Value Taken Time  BP 127/83   Temp 36.7   Pulse 83   Resp 18   SpO2 97     Last Pain:  Vitals:   07/28/24 0834  TempSrc:   PainSc: 0-No pain         Complications: No notable events documented.

## 2024-07-28 NOTE — Interval H&P Note (Signed)
 History and Physical Interval Note:  07/28/2024 8:36 AM  [name]  has presented today for surgery, with the diagnosis of afib.  The various methods of treatment have been discussed with the patient and family. After consideration of risks, benefits and other options for treatment, the patient has consented to  Procedure(s): CARDIOVERSION (N/A) as a surgical intervention.  The patient's history has been reviewed, patient examined, no change in status, stable for surgery.  I have reviewed the patient's chart and labs.  Questions were answered to the patient's satisfaction.     Linn Goetze

## 2024-07-29 ENCOUNTER — Encounter (HOSPITAL_COMMUNITY): Payer: Self-pay | Admitting: Cardiovascular Disease

## 2024-08-11 NOTE — Progress Notes (Signed)
 Remote PPM Transmission

## 2024-08-17 ENCOUNTER — Other Ambulatory Visit: Payer: Self-pay | Admitting: Cardiovascular Disease

## 2024-08-19 MED ORDER — METOPROLOL SUCCINATE ER 25 MG PO TB24: 25.0000 mg | ORAL_TABLET | Freq: Every day | ORAL | 3 refills | Status: DC

## 2024-08-24 ENCOUNTER — Ambulatory Visit: Attending: Cardiovascular Disease | Admitting: Cardiovascular Disease

## 2024-08-24 ENCOUNTER — Encounter: Payer: Self-pay | Admitting: Cardiovascular Disease

## 2024-08-24 VITALS — BP 141/70 | HR 60 | Ht 68.0 in | Wt 165.8 lb

## 2024-08-24 DIAGNOSIS — Z950 Presence of cardiac pacemaker: Secondary | ICD-10-CM

## 2024-08-24 DIAGNOSIS — I10 Essential (primary) hypertension: Secondary | ICD-10-CM

## 2024-08-24 DIAGNOSIS — I25810 Atherosclerosis of coronary artery bypass graft(s) without angina pectoris: Secondary | ICD-10-CM | POA: Diagnosis not present

## 2024-08-24 DIAGNOSIS — D6869 Other thrombophilia: Secondary | ICD-10-CM | POA: Diagnosis not present

## 2024-08-24 DIAGNOSIS — E039 Hypothyroidism, unspecified: Secondary | ICD-10-CM

## 2024-08-24 DIAGNOSIS — Z01812 Encounter for preprocedural laboratory examination: Secondary | ICD-10-CM

## 2024-08-24 DIAGNOSIS — I495 Sick sinus syndrome: Secondary | ICD-10-CM

## 2024-08-24 DIAGNOSIS — I4819 Other persistent atrial fibrillation: Secondary | ICD-10-CM

## 2024-08-24 MED ORDER — FLECAINIDE ACETATE 50 MG PO TABS
50.0000 mg | ORAL_TABLET | Freq: Two times a day (BID) | ORAL | 3 refills | Status: DC
Start: 2024-08-24 — End: 2024-08-24

## 2024-08-24 NOTE — Patient Instructions (Signed)
 Medication Instructions:  No changes *If you need a refill on your cardiac medications before your next appointment, please call your pharmacy*  Lab Work: None at this time- will be ordered by Afib Clinic If you have labs (blood work) drawn today and your tests are completely normal, you will receive your results only by: MyChart Message (if you have MyChart) OR A paper copy in the mail If you have any lab test that is abnormal or we need to change your treatment, we will call you to review the results.  Testing/Procedures: None at this time  Follow-Up: At [facility], you and your health needs are our priority.  As part of our continuing mission to provide you with exceptional heart care, our providers are all part of one team.  This team includes your primary Cardiologist (physician) and Advanced Practice Providers or APPs (Physician Assistants and Nurse Practitioners) who all work together to provide you with the care you need, when you need it.  Your next appointment:   Admission for Tikosyn- will call you when you need to come to Afib Clinic and when bed available for Tikosyn admission.     We recommend signing up for the patient portal called MyChart.  Sign up information is provided on this After Visit Summary.  MyChart is used to connect with patients for Virtual Visits (Telemedicine).  Patients are able to view lab/test results, encounter notes, upcoming appointments, etc.  Non-urgent messages can be sent to your provider as well.   To learn more about what you can do with MyChart, go to [email or web address].

## 2024-08-24 NOTE — Progress Notes (Signed)
 Cardiology Office Note    Date:  08/24/2024   ID:  [name], DOB 02/20/1933, MRN [id number]  PCP:  Rudolpho Norleen BIRCH, MD  Cardiologist:   Jerel Balding, MD   No chief complaint on file.   History of Present Illness:  [name] is a 89 y.o. male with CAD s/p CABG, sinus node dysfunction and paroxysmal atrial fibrillation, status post dual-chamber permanent pacemaker implantation in 2007 (generator change 2015, Medtronic Adapta) returning for follow-up after undergoing direct-current cardioversion for persistent atrial fibrillation 07/28/2024.  He felt immediately better after the cardioversion and still feels reasonably well today, but he has recurrent atrial fibrillation.  Normal rhythm was only maintained for roughly 48 hours based on pacemaker interrogation.  He remains unaware of palpitations and his biggest complaint is fatigue.  Generally NYHA functional class II, without orthopnea, PND or lower extremity edema.  He has not had angina pectoris, dizziness or syncope.  He felt poorly while he was in atrial fibrillation, appetite diminished and has lost some weight.  Pacemaker interrogation shows that atrial fibrillation recurred and has been persistent since 07/30/2024.  He has good ventricular rate control.  He only has roughly 3% ventricular pacing.  All lead parameters are normal and estimated generator longevity is approximately 3 years.  Before persistent atrial fibrillation he had roughly 75% atrial pacing.  He still has the original leads from 2007 (atrial lead Medtronic [id number], ventricularly Medtronic [id number]) with excellent parameters.  The ventricular lead is not MRI conditional.  Heart rate histogram distribution is appropriate.    It's been about 17 years since he underwent bypass surgery.  It is important to note that he had no angina pectoris before bypass.  He experienced fatigue and dyspnea, feeling washed out with work-up eventually leading to bypass surgery.  We  rechecked an echocardiogram and a Lexiscan  Myoview  in June 2023.  He had a normal pattern of myocardial perfusion.  LV systolic function was normal by both echo and quantitative scintigraphy.  His left atrium was moderately dilated.  As before he has a borderline elevated pulmonary artery pressure of about 38 mmHg, without any abnormalities of the right heart.  The ascending aorta is borderline dilated, unchanged from previous echo.  He was poorly tolerant of rosuvastatin  due to myopathy symptoms when he was taken on a daily basis.  Continues to take it every other day and is tolerating it fine.  Normal left ventricular systolic function by echo most recently performed in 2014. Last nuclear stress test in 2012 was normal. He underwent bypass surgery in 2007 (Dr. Lucas 2007, LIMA to LAD, SVG to OM, SVG to RCA). His pacemaker is a Medtronic Adapta dual-chamber device implanted in 2015 (leads from 2007). He has treated hyperlipidemia, followed by his primary care physician. He has mild thrombocytopenia without bleeding complications.  Past Medical History:  Diagnosis Date   Arthritis    Hyperlipidemia    Hypertension    Hypothyroidism    Paroxysmal atrial fibrillation (HCC) 11/22/2014   Thrombocytopenia 05/23/2015    Past Surgical History:  Procedure Laterality Date   bilateral carotid duplex ultrasound showed no ICA     stenosis   CARDIAC CATHETERIZATION     Dr. Herminio which showed a 95% ostial LAD stenosis   CARDIOVERSION N/A 07/28/2024   Procedure: CARDIOVERSION;  Surgeon: Balding Jerel, MD;  Location: [facility] INVASIVE CV LAB;  Service: Cardiovascular;  Laterality: N/A;   CORONARY ARTERY BYPASS GRAFT     x3 using  a left internal mammary artery   PACEMAKER GENERATOR CHANGE N/A 08/09/2014   Procedure: PACEMAKER GENERATOR CHANGE CHADWICK;  Surgeon: Jerel Balding, MD;  Location: [facility] CATH LAB;  Service: Cardiovascular;  Laterality: N/A;   Placement of permanent pacemaker     US  ECHOCARDIOGRAPHY      lft ventricular ejection fraction was about 60%    Current Medications: Outpatient Medications Prior to Visit  Medication Sig Dispense Refill   apixaban  (ELIQUIS ) 5 MG TABS tablet Take 1 tablet (5 mg total) by mouth 2 (two) times daily. 60 tablet 5   cyanocobalamin (VITAMIN B12) 1000 MCG tablet Take 1,000 mcg by mouth daily.     levothyroxine (SYNTHROID) 125 MCG tablet Take 125 mcg by mouth daily before breakfast.     metoprolol  succinate (TOPROL -XL) 25 MG 24 hr tablet Take 1 tablet (25 mg total) by mouth daily. 90 tablet 3   metoprolol  tartrate (LOPRESSOR ) 25 MG tablet Take 0.5 tablets (12.5 mg total) by mouth 2 (two) times daily as needed (take as needed for palpitations). 60 tablet 1   rosuvastatin  (CRESTOR ) 10 MG tablet TAKE 1 TABLET BY MOUTH DAILY 90 tablet 3   No facility-administered medications prior to visit.     Allergies:   Latex and Sulfa antibiotics  Family History:  The patient's family history includes Hypertension in his mother; Stroke in his mother.   ROS:   Please see the history of present illness.    ROS All other systems are reviewed and are negative.  PHYSICAL EXAM:   VS:  BP (!) 141/70 (BP Location: Right Arm)   Pulse 60   Ht 5' 8 (1.727 m)   Wt 165 lb 12.8 oz (75.2 kg)   SpO2 95%   BMI 25.21 kg/m      General: Alert, oriented x3, no distress, healthy left subclavian pacemaker site.  Sternotomy scar. Head: no evidence of trauma, PERRL, EOMI, no exophtalmos or lid lag, no myxedema, no xanthelasma; normal ears, nose and oropharynx Neck: normal jugular venous pulsations and no hepatojugular reflux; brisk carotid pulses without delay and no carotid bruits Chest: clear to auscultation, no signs of consolidation by percussion or palpation, normal fremitus, symmetrical and full respiratory excursions Cardiovascular: normal position and quality of the apical impulse, regular rhythm, normal first and second heart sounds, no murmurs, rubs or gallops Abdomen: no  tenderness or distention, no masses by palpation, no abnormal pulsatility or arterial bruits, normal bowel sounds, no hepatosplenomegaly Extremities: no clubbing, cyanosis or edema; 2+ radial, ulnar and brachial pulses bilaterally; 2+ right femoral, posterior tibial and dorsalis pedis pulses; 2+ left femoral, posterior tibial and dorsalis pedis pulses; no subclavian or femoral bruits Neurological: grossly nonfocal Psych: Normal mood and affect    Wt Readings from Last 3 Encounters:  08/24/24 165 lb 12.8 oz (75.2 kg)  07/28/24 168 lb (76.2 kg)  07/01/24 170 lb 8 oz (77.3 kg)      Studies/Labs Reviewed:  04/22/2022 Lexiscan  Myoview    Findings are consistent with no prior ischemia and no prior myocardial infarction. The study is low risk.   No ST deviation was noted.   LV perfusion is normal. There is no evidence of ischemia.   Left ventricular function is abnormal. There was a single regional abnormality. Nuclear stress EF: 55 %. The left ventricular ejection fraction is normal (55-65%). End diastolic cavity size is normal. End systolic cavity size is normal.   Prior study available for comparison from 02/11/2011.   05/03/2022 echocardiogram   1. Left  ventricular ejection fraction, by estimation, is 60 to 65%. The  left ventricle has normal function. The left ventricle has no regional  wall motion abnormalities. There is moderate asymmetric left ventricular  hypertrophy of the basal-septal  segment. Left ventricular diastolic parameters are consistent with Grade  II diastolic dysfunction (pseudonormalization).   2. Right ventricular systolic function is normal. The right ventricular  size is normal. There is mildly elevated pulmonary artery systolic  pressure.   3. Left atrial size was moderately dilated.   4. The mitral valve is degenerative. Mild mitral valve regurgitation. No  evidence of mitral stenosis.   5. Tricuspid valve regurgitation is mild to moderate.   6. The aortic  valve is tricuspid. There is mild calcification of the  aortic valve. Aortic valve regurgitation is mild. Aortic valve  sclerosis/calcification is present, without any evidence of aortic  stenosis.   7. Aortic dilatation noted. There is borderline dilatation of the aortic  root, measuring 39 mm.   8. The inferior vena cava is normal in size with greater than 50%  respiratory variability, suggesting right atrial pressure of 3 mmHg.   Comparison(s): 05/11/13 EF 55-60%. PA pressure .   EKG:    EKG Interpretation Date/Time:  Tuesday August 24 2024 10:35:49 EDT Ventricular Rate:  64 PR Interval:    QRS Duration:  90 QT Interval:  440 QTC Calculation: 454 R Axis:   41  Text Interpretation: Ventricular-paced rhythm When compared with ECG of 28-Jul-2024 09:24, Electronic ventricular pacemaker has replaced Electronic atrial pacemaker Vent. rate has decreased BY  31 BPM Confirmed by Natsuko Kelsay [phone]) on 08/24/2024 10:37:16 AM Also confirmed by Jomel Whittlesey [phone])  on 08/24/2024 10:58:25 AM         Recent Labs: 06/17/2023 Cholesterol 139, triglycerides 63, HDL 53, LDL 73 Hemoglobin 84.8, potassium 4.7, creatinine 1.2, normal liver function tests TSH was mildly elevated at 7.181 (in February it was 5.013). TSH 4.186, hemoglobin 14.8, potassium 4.6, creatinine 1.1, normal liver function tests  06/17/2024 Hemoglobin 14.8, creatinine 1.2, potassium 5.0, normal liver function tests Cholesterol 141, triglycerides 75, HDL 53, LDL 73 TSH 5.637  Lipid Panel     Component Value Date/Time   CHOL 162 09/24/2021 0845   TRIG 89 09/24/2021 0845   HDL 52 09/24/2021 0845   CHOLHDL 3.1 09/24/2021 0845   LDLCALC 93 09/24/2021 0845   LABVLDL 17 09/24/2021 0845   BMET    Component Value Date/Time   NA 138 07/01/2024 1011   K 5.0 07/01/2024 1011   CL 101 07/01/2024 1011   CO2 21 07/01/2024 1011   GLUCOSE 116 (H) 07/01/2024 1011   GLUCOSE 92 01/13/2010 0628   BUN 12 07/01/2024  1011   CREATININE 1.23 07/01/2024 1011   CALCIUM  9.2 07/01/2024 1011   EGFR 56 (L) 07/01/2024 1011   GFRNONAA 55 (L) 12/09/2017 0901    ASSESSMENT:    1. Persistent atrial fibrillation (HCC)   2. Pre-procedure lab exam   3. Coronary artery disease involving coronary bypass graft of native heart without angina pectoris   4. Acquired thrombophilia   5. SSS (sick sinus syndrome) (HCC)   6. Pacemaker   7. Essential hypertension   8. Acquired hypothyroidism       PLAN:  In order of problems listed above:  AFib: Early recurrence of symptomatic persistent atrial fibrillation after cardioversion 07/28/2024.  Has good ventricular rate control.  CHADSVasc 4 (age 20, HTN, CAD).  Antiarrhythmic choices are limited due to his history of  coronary disease, albeit asymptomatic.  Limited to class III antiarrhythmic agents and would like to avoid amiodarone if possible.  We briefly discussed ablation, but he is 89 years old and the risk-benefit ratio may be better for dofetilide.  Will hospitalize for 72 hours to initiate the drug, he understands the need for careful monitoring due to the risk of serious ventricular arrhythmias.  Creatinine clearance based on most recent labs is 56.  Most recent potassium level was 5.0.  He does not take any medications that would interact with dofetilide.  Advair have been no interruptions in his apixaban . CAD s/p CABG: He is asymptomatic, but never really had angina pectoris even before he had bypass surgery.  Low risk recent nuclear stress test in June 2023.  Normal left ventricular systolic function. Eliquis : No bleeding complications.  No falls.  Understands it is very important not to miss doses of Eliquis  while we are planning either chemical or electrical cardioversion. SSS: Before he had persistent atrial fibrillation, he had good heart rate histogram distribution with the current sensor settings. PPM: Normal pacemaker function today.  Continue remote downloads  every 3 months.  He has to go to an Internet store to do his downloads, since he has poor reception at home. HTN: Borderline control.  No adjustments were made to his medications today. HLP: Target LDL under 70; most recently was close to that at 73.  Continue rosuvastatin . Hypothyroidism: Clinically euthyroid and normal recent TSH.   Informed Consent   Shared Decision Making/Informed Consent The risks (stroke, cardiac arrhythmias rarely resulting in the need for a temporary or permanent pacemaker, skin irritation or burns and complications associated with conscious sedation including aspiration, arrhythmia, respiratory failure and death), benefits (restoration of normal sinus rhythm) and alternatives of a direct current cardioversion were explained in detail to Mr. Rossano and he agrees to proceed.         Medication Adjustments/Labs and Tests Ordered: Current medicines are reviewed at length with the patient today.  Concerns regarding medicines are outlined above.  Medication changes, Labs and Tests ordered today are listed in the Patient Instructions below. Patient Instructions  Medication Instructions:  No changes *If you need a refill on your cardiac medications before your next appointment, please call your pharmacy*  Lab Work: None at this time- will be ordered by Afib Clinic If you have labs (blood work) drawn today and your tests are completely normal, you will receive your results only by: MyChart Message (if you have MyChart) OR A paper copy in the mail If you have any lab test that is abnormal or we need to change your treatment, we will call you to review the results.  Testing/Procedures: None at this time  Follow-Up: At [facility], you and your health needs are our priority.  As part of our continuing mission to provide you with exceptional heart care, our providers are all part of one team.  This team includes your primary Cardiologist (physician) and Advanced  Practice Providers or APPs (Physician Assistants and Nurse Practitioners) who all work together to provide you with the care you need, when you need it.  Your next appointment:   Admission for Tikosyn- will call you when you need to come to Afib Clinic and when bed available for Tikosyn admission.     We recommend signing up for the patient portal called MyChart.  Sign up information is provided on this After Visit Summary.  MyChart is used to connect with patients for Virtual  Visits (Telemedicine).  Patients are able to view lab/test results, encounter notes, upcoming appointments, etc.  Non-urgent messages can be sent to your provider as well.   To learn more about what you can do with MyChart, go to [email or web address].      Signed, Jerel Balding, MD  08/24/2024 11:30 AM    [facility] [address] [place], [place], [place]  [address] Phone: [phone]; Fax: [phone]

## 2024-08-25 ENCOUNTER — Telehealth: Payer: Self-pay

## 2024-08-25 DIAGNOSIS — I4819 Other persistent atrial fibrillation: Secondary | ICD-10-CM

## 2024-08-25 NOTE — Telephone Encounter (Signed)
 Pt called in stating he does not want to take the [place] and states he will live with his afib. Pt would like a call back to discuss and make sure croitoru knows

## 2024-08-25 NOTE — Telephone Encounter (Signed)
 Ok , then, after all that. Thanks for doing the work

## 2024-08-25 NOTE — Telephone Encounter (Signed)
 Patient called back to afib clinic after Tikosyn admission was scheduled earlier today. After consideration he has decided he does not want to proceed with admission; he does not feel he is symptomatic back in afib and would prefer a conservative approach going forward. Offered appt to discuss options but patient states his mind is made up. I informed patient I would be sure to let Dr Francyne know of his decision. Pt appreciative of call and information.

## 2024-08-26 NOTE — Telephone Encounter (Signed)
 Let's do 3 months please

## 2024-08-30 ENCOUNTER — Encounter: Payer: Self-pay | Admitting: Cardiovascular Disease

## 2024-08-30 NOTE — Addendum Note (Signed)
 Addended by: Dominyck Reser L on: 08/30/2024 05:45 PM   Modules accepted: Orders

## 2024-08-30 NOTE — Telephone Encounter (Signed)
 S/w pt's wife and informed that a referral has been placed to EP for ablation consult.   Asked them to look at the MyChart message that Dr Francyne sent and let us  know what you would like to do.  She verbalized understanding and will let us  know soon.

## 2024-08-30 NOTE — Telephone Encounter (Signed)
 S/w the patient- made appt for 3 mo f/u.  He reports that he is interested in a consult for Ablation. He would also be open to another cardioversion as well if you recommend it.  Informed him that I will give this information to Dr Francyne, then get back to him. He verbalized understanding.

## 2024-08-30 NOTE — Telephone Encounter (Signed)
 Please refer to EP - [place] or [facility] or [employer] or [place]. Amiodarone 400 mg daily for 3 weeks then 200 mg daily, AFib clinic in 3 weeks and DC cardioversion in 4 weeks if he agrees to the plan I laid out.

## 2024-08-31 MED ORDER — AMIODARONE HCL 200 MG PO TABS: ORAL_TABLET | ORAL | 3 refills | Status: DC

## 2024-08-31 NOTE — Telephone Encounter (Signed)
 Croitoru, Jerel, MD to Me (Selected Message)    08/30/24  4:50 PM Note Please refer to EP - [place] or [facility] or [employer] or [place]. Amiodarone 400 mg daily for 3 weeks then 200 mg daily, AFib clinic in 3 weeks and DC cardioversion in 4 weeks if he agrees to the plan I laid out.

## 2024-08-31 NOTE — Addendum Note (Signed)
 Addended by: Allianna Beaubien L on: 08/31/2024 11:22 AM   Modules accepted: Orders

## 2024-08-31 NOTE — Telephone Encounter (Signed)
 Go ahead and schedule CV with me on November 20. He should be seen in Afib clinic a few days before that and he can get labs the day he sees them. Thanks

## 2024-09-02 ENCOUNTER — Encounter (HOSPITAL_COMMUNITY): Payer: Self-pay

## 2024-09-02 ENCOUNTER — Ambulatory Visit (HOSPITAL_COMMUNITY): Admit: 2024-09-02 | Admitting: Cardiovascular Disease

## 2024-09-02 SURGERY — CARDIOVERSION (CATH LAB)
Anesthesia: Monitor Anesthesia Care

## 2024-09-06 ENCOUNTER — Ambulatory Visit (HOSPITAL_COMMUNITY): Admitting: Internal Medicine

## 2024-09-08 ENCOUNTER — Ambulatory Visit

## 2024-09-08 DIAGNOSIS — I495 Sick sinus syndrome: Secondary | ICD-10-CM | POA: Diagnosis not present

## 2024-09-09 ENCOUNTER — Telehealth: Payer: Self-pay

## 2024-09-09 LAB — CUP PACEART REMOTE DEVICE CHECK
Battery Impedance: 1736 Ohm
Battery Remaining Longevity: 39 mo
Battery Voltage: 2.75 V
Date Time Interrogation Session: 20251029102856
Implantable Lead Connection Status: 753985
Implantable Lead Connection Status: 753985
Implantable Lead Implant Date: 20071115
Implantable Lead Implant Date: 20071115
Implantable Lead Location: 753859
Implantable Lead Location: 753860
Implantable Lead Model: 4092
Implantable Lead Model: 5076
Implantable Pulse Generator Implant Date: 20150929
Lead Channel Impedance Value: 518 Ohm
Lead Channel Impedance Value: 590 Ohm
Lead Channel Pacing Threshold Amplitude: 0.375 V
Lead Channel Pacing Threshold Amplitude: 0.5 V
Lead Channel Pacing Threshold Pulse Width: 0.4 ms
Lead Channel Pacing Threshold Pulse Width: 0.4 ms
Lead Channel Setting Pacing Amplitude: 2 V
Lead Channel Setting Pacing Amplitude: 2.5 V
Lead Channel Setting Pacing Pulse Width: 0.4 ms
Lead Channel Setting Sensing Sensitivity: 5.6 mV
Zone Setting Status: 755011
Zone Setting Status: 755011

## 2024-09-09 NOTE — Telephone Encounter (Signed)
 Pt called in wanting to know if there was any afib seen on his transmission from 09/08/2024

## 2024-09-14 ENCOUNTER — Encounter (HOSPITAL_COMMUNITY): Payer: Self-pay | Admitting: Internal Medicine

## 2024-09-14 ENCOUNTER — Ambulatory Visit (HOSPITAL_COMMUNITY): Payer: Self-pay | Admitting: Internal Medicine

## 2024-09-14 ENCOUNTER — Ambulatory Visit (HOSPITAL_COMMUNITY)
Admission: RE | Admit: 2024-09-14 | Discharge: 2024-09-14 | Disposition: A | Discharge status: Home / Self Care | Source: Ambulatory Visit | Attending: Internal Medicine | Admitting: Internal Medicine

## 2024-09-14 VITALS — BP 136/78 | HR 71 | Ht 68.0 in | Wt 171.2 lb

## 2024-09-14 DIAGNOSIS — Z79899 Other long term (current) drug therapy: Secondary | ICD-10-CM

## 2024-09-14 DIAGNOSIS — Z5181 Encounter for therapeutic drug level monitoring: Secondary | ICD-10-CM

## 2024-09-14 DIAGNOSIS — I480 Paroxysmal atrial fibrillation: Secondary | ICD-10-CM | POA: Diagnosis not present

## 2024-09-14 DIAGNOSIS — D6869 Other thrombophilia: Secondary | ICD-10-CM

## 2024-09-14 DIAGNOSIS — I4892 Unspecified atrial flutter: Secondary | ICD-10-CM

## 2024-09-14 LAB — CBC
Hematocrit: 42.3 % (ref 37.5–51.0)
Hemoglobin: 13.8 g/dL (ref 13.0–17.7)
MCH: 29.2 pg (ref 26.6–33.0)
MCHC: 32.6 g/dL (ref 31.5–35.7)
MCV: 89 fL (ref 79–97)
Platelets: 139 x10E3/uL — ABNORMAL LOW (ref 150–450)
RBC: 4.73 x10E6/uL (ref 4.14–5.80)
RDW: 15.3 % (ref 11.6–15.4)
WBC: 7.8 x10E3/uL (ref 3.4–10.8)

## 2024-09-14 LAB — BASIC METABOLIC PANEL WITH GFR
BUN/Creatinine Ratio: 10 (ref 10–24)
BUN: 13 mg/dL (ref 8–27)
CO2: 28 mmol/L (ref 20–29)
Calcium: 9.1 mg/dL (ref 8.6–10.2)
Chloride: 103 mmol/L (ref 96–106)
Creatinine, Ser: 1.36 mg/dL — ABNORMAL HIGH (ref 0.76–1.27)
Glucose: 90 mg/dL (ref 70–99)
Potassium: 4.4 mmol/L (ref 3.5–5.2)
Sodium: 137 mmol/L (ref 134–144)
eGFR: 50 mL/min/1.73 — ABNORMAL LOW (ref >59)

## 2024-09-14 NOTE — Progress Notes (Signed)
 Primary Care Physician: Rudolpho Norleen BIRCH, MD Primary Cardiologist: Jerel Balding, MD Electrophysiologist: None     Referring Physician: Device clinic     [name] is a 89 y.o. male with a history of CAD s/p CABG, sinus node dysfunction s/p PPM 2007, HTN, SVT, and paroxysmal atrial fibrillation who presents for consultation in the [facility]. Patient contacted device clinic 6/19 noting to feel unwell and device interrogation showed Afib with RVR then converted to NSR. Also noted patient saw PCP same day and prescribed doxycycline  for URI. Patient is on Eliquis  5 mg BID for a CHADS2VASC score of 4.  On follow up 09/14/24, patient is currently in atrial flutter. He began amiodarone load by Dr. Balding and is scheduled for cardioversion on 11/20. He has not missed any doses of Eliquis  5 mg BID.  Today, he denies symptoms of palpitations, chest pain, shortness of breath, orthopnea, PND, lower extremity edema, dizziness, presyncope, syncope, snoring, daytime somnolence, bleeding, or neurologic sequela. The patient is tolerating medications without difficulties and is otherwise without complaint today.    he has a BMI of Body mass index is 26.03 kg/m.SABRA Filed Weights   09/14/24 1107  Weight: 77.7 kg     Current Outpatient Medications  Medication Sig Dispense Refill   amiodarone (PACERONE) 200 MG tablet Take 400 mg daily for 3 weeks then take 200 mg daily. 50 tablet 3   apixaban  (ELIQUIS ) 5 MG TABS tablet Take 1 tablet (5 mg total) by mouth 2 (two) times daily. 60 tablet 5   cyanocobalamin (VITAMIN B12) 1000 MCG tablet Take 1,000 mcg by mouth daily.     levothyroxine (SYNTHROID) 125 MCG tablet Take 125 mcg by mouth daily before breakfast.     metoprolol  succinate (TOPROL -XL) 25 MG 24 hr tablet Take 1 tablet (25 mg total) by mouth daily. 90 tablet 3   metoprolol  tartrate (LOPRESSOR ) 25 MG tablet Take 0.5 tablets (12.5 mg total) by mouth 2 (two) times daily  as needed (take as needed for palpitations). 60 tablet 1   rosuvastatin  (CRESTOR ) 10 MG tablet TAKE 1 TABLET BY MOUTH DAILY 90 tablet 3   No current facility-administered medications for this encounter.    Atrial Fibrillation Management history:  Previous antiarrhythmic drugs: amiodarone Previous cardioversions: none Previous ablations: none Anticoagulation history: Eliquis    ROS- All systems are reviewed and negative except as per the HPI above.  Physical Exam: BP 136/78   Pulse 71   Ht 5' 8 (1.727 m)   Wt 77.7 kg   BMI 26.03 kg/m   GEN- The patient is well appearing, alert and oriented x 3 today.   Neck - no JVD or carotid bruit noted Lungs- Clear to ausculation bilaterally, normal work of breathing Heart- Irregular rate and rhythm, no murmurs, rubs or gallops, PMI not laterally displaced Extremities- no clubbing, cyanosis, or edema Skin - no rash or ecchymosis noted   EKG today demonstrates  Vent. rate 71 BPM PR interval * ms QRS duration 96 ms QT/QTcB 404/439 ms P-R-T axes * 96 -66 Atrial flutter with variable A-V block with premature ventricular or aberrantly conducted complexes and with ventricular escape complexes Rightward axis T wave abnormality, consider inferior ischemia T wave abnormality, consider anterolateral ischemia Abnormal ECG When compared with ECG of 24-Aug-2024 10:35, Atrial flutter has replaced Electronic ventricular pacemaker  Echo 05/03/22 demonstrated  1. Left ventricular ejection fraction, by estimation, is 60 to 65%. The  left ventricle has normal function. The left  ventricle has no regional  wall motion abnormalities. There is moderate asymmetric left ventricular  hypertrophy of the basal-septal  segment. Left ventricular diastolic parameters are consistent with Grade  II diastolic dysfunction (pseudonormalization).   2. Right ventricular systolic function is normal. The right ventricular  size is normal. There is mildly elevated  pulmonary artery systolic  pressure.   3. Left atrial size was moderately dilated.   4. The mitral valve is degenerative. Mild mitral valve regurgitation. No  evidence of mitral stenosis.   5. Tricuspid valve regurgitation is mild to moderate.   6. The aortic valve is tricuspid. There is mild calcification of the  aortic valve. Aortic valve regurgitation is mild. Aortic valve  sclerosis/calcification is present, without any evidence of aortic  stenosis.   7. Aortic dilatation noted. There is borderline dilatation of the aortic  root, measuring 39 mm.   8. The inferior vena cava is normal in size with greater than 50%  respiratory variability, suggesting right atrial pressure of 3 mmHg.    ASSESSMENT & PLAN CHA2DS2-VASc Score = 4  The patient's score is based upon: CHF History: 0 HTN History: 1 Diabetes History: 0 Stroke History: 0 Vascular Disease History: 1 Age Score: 2 Gender Score: 0       ASSESSMENT AND PLAN: Persistent Atrial Fibrillation / Atrial Flutter (ICD10:  I48.0) The patient's CHA2DS2-VASc score is 4, indicating a 4.8% annual risk of stroke.    Patient is currently in atrial flutter. We discussed the procedure cardioversion to try to convert to NSR. We discussed the risks vs benefits of this procedure and how ultimately we cannot predict whether a patient will have early return of arrhythmia post procedure. After discussion, the patient wishes to proceed with cardioversion. Labs drawn today.  Informed Consent   Shared Decision Making/Informed Consent The risks (stroke, cardiac arrhythmias rarely resulting in the need for a temporary or permanent pacemaker, skin irritation or burns and complications associated with conscious sedation including aspiration, arrhythmia, respiratory failure and death), benefits (restoration of normal sinus rhythm) and alternatives of a direct current cardioversion were explained in detail to [name] and he agrees to proceed.         High risk medication monitoring (ICD10: U5195107) Patient requires ongoing monitoring for anti-arrhythmic medication which has the potential to cause life threatening arrhythmias or AV block. Qtc stable. Continue amiodarone load as prescribed.   Secondary Hypercoagulable State (ICD10:  D68.69) The patient is at significant risk for stroke/thromboembolism based upon his CHA2DS2-VASc Score of 4.  Continue Apixaban  (Eliquis ).  No missed doses.    Follow up 2 weeks after DCCV.   Terra Pac, PA-C  [facility] [address] [place], [place] [address] [phone]

## 2024-09-14 NOTE — H&P (View-Only) (Signed)
 Primary Care Physician: Ian Norleen BIRCH, MD Primary Cardiologist: Ian Balding, MD Electrophysiologist: None     Referring Physician: Device clinic     [name] is a 89 y.o. male with a history of CAD s/p CABG, sinus node dysfunction s/p PPM 2007, HTN, SVT, and paroxysmal atrial fibrillation who presents for consultation in the [facility]. Patient contacted device clinic 6/19 noting to feel unwell and device interrogation showed Afib with RVR then converted to NSR. Also noted patient saw PCP same day and prescribed doxycycline  for URI. Patient is on Eliquis  5 mg BID for a CHADS2VASC score of 4.  On follow up 09/14/24, patient is currently in atrial flutter. He began amiodarone load by Dr. Balding and is scheduled for cardioversion on 11/20. He has not missed any doses of Eliquis  5 mg BID.  Today, he denies symptoms of palpitations, chest pain, shortness of breath, orthopnea, PND, lower extremity edema, dizziness, presyncope, syncope, snoring, daytime somnolence, bleeding, or neurologic sequela. The patient is tolerating medications without difficulties and is otherwise without complaint today.    he has a BMI of Body mass index is 26.03 kg/m.SABRA Filed Weights   09/14/24 1107  Weight: 77.7 kg     Current Outpatient Medications  Medication Sig Dispense Refill   amiodarone (PACERONE) 200 MG tablet Take 400 mg daily for 3 weeks then take 200 mg daily. 50 tablet 3   apixaban  (ELIQUIS ) 5 MG TABS tablet Take 1 tablet (5 mg total) by mouth 2 (two) times daily. 60 tablet 5   cyanocobalamin (VITAMIN B12) 1000 MCG tablet Take 1,000 mcg by mouth daily.     levothyroxine (SYNTHROID) 125 MCG tablet Take 125 mcg by mouth daily before breakfast.     metoprolol  succinate (TOPROL -XL) 25 MG 24 hr tablet Take 1 tablet (25 mg total) by mouth daily. 90 tablet 3   metoprolol  tartrate (LOPRESSOR ) 25 MG tablet Take 0.5 tablets (12.5 mg total) by mouth 2 (two) times daily  as needed (take as needed for palpitations). 60 tablet 1   rosuvastatin  (CRESTOR ) 10 MG tablet TAKE 1 TABLET BY MOUTH DAILY 90 tablet 3   No current facility-administered medications for this encounter.    Atrial Fibrillation Management history:  Previous antiarrhythmic drugs: amiodarone Previous cardioversions: none Previous ablations: none Anticoagulation history: Eliquis    ROS- All systems are reviewed and negative except as per the HPI above.  Physical Exam: BP 136/78   Pulse 71   Ht 5' 8 (1.727 m)   Wt 77.7 kg   BMI 26.03 kg/m   GEN- The patient is well appearing, alert and oriented x 3 today.   Neck - no JVD or carotid bruit noted Lungs- Clear to ausculation bilaterally, normal work of breathing Heart- Irregular rate and rhythm, no murmurs, rubs or gallops, PMI not laterally displaced Extremities- no clubbing, cyanosis, or edema Skin - no rash or ecchymosis noted   EKG today demonstrates  Vent. rate 71 BPM PR interval * ms QRS duration 96 ms QT/QTcB 404/439 ms P-R-T axes * 96 -66 Atrial flutter with variable A-V block with premature ventricular or aberrantly conducted complexes and with ventricular escape complexes Rightward axis T wave abnormality, consider inferior ischemia T wave abnormality, consider anterolateral ischemia Abnormal ECG When compared with ECG of 24-Aug-2024 10:35, Atrial flutter has replaced Electronic ventricular pacemaker  Echo 05/03/22 demonstrated  1. Left ventricular ejection fraction, by estimation, is 60 to 65%. The  left ventricle has normal function. The left  ventricle has no regional  wall motion abnormalities. There is moderate asymmetric left ventricular  hypertrophy of the basal-septal  segment. Left ventricular diastolic parameters are consistent with Grade  II diastolic dysfunction (pseudonormalization).   2. Right ventricular systolic function is normal. The right ventricular  size is normal. There is mildly elevated  pulmonary artery systolic  pressure.   3. Left atrial size was moderately dilated.   4. The mitral valve is degenerative. Mild mitral valve regurgitation. No  evidence of mitral stenosis.   5. Tricuspid valve regurgitation is mild to moderate.   6. The aortic valve is tricuspid. There is mild calcification of the  aortic valve. Aortic valve regurgitation is mild. Aortic valve  sclerosis/calcification is present, without any evidence of aortic  stenosis.   7. Aortic dilatation noted. There is borderline dilatation of the aortic  root, measuring 39 mm.   8. The inferior vena cava is normal in size with greater than 50%  respiratory variability, suggesting right atrial pressure of 3 mmHg.    ASSESSMENT & PLAN CHA2DS2-VASc Score = 4  The patient's score is based upon: CHF History: 0 HTN History: 1 Diabetes History: 0 Stroke History: 0 Vascular Disease History: 1 Age Score: 2 Gender Score: 0       ASSESSMENT AND PLAN: Persistent Atrial Fibrillation / Atrial Flutter (ICD10:  I48.0) The patient's CHA2DS2-VASc score is 4, indicating a 4.8% annual risk of stroke.    Patient is currently in atrial flutter. We discussed the procedure cardioversion to try to convert to NSR. We discussed the risks vs benefits of this procedure and how ultimately we cannot predict whether a patient will have early return of arrhythmia post procedure. After discussion, the patient wishes to proceed with cardioversion. Labs drawn today.  Informed Consent   Shared Decision Making/Informed Consent The risks (stroke, cardiac arrhythmias rarely resulting in the need for a temporary or permanent pacemaker, skin irritation or burns and complications associated with conscious sedation including aspiration, arrhythmia, respiratory failure and death), benefits (restoration of normal sinus rhythm) and alternatives of a direct current cardioversion were explained in detail to [name] and he agrees to proceed.         High risk medication monitoring (ICD10: U5195107) Patient requires ongoing monitoring for anti-arrhythmic medication which has the potential to cause life threatening arrhythmias or AV block. Qtc stable. Continue amiodarone load as prescribed.   Secondary Hypercoagulable State (ICD10:  D68.69) The patient is at significant risk for stroke/thromboembolism based upon his CHA2DS2-VASc Score of 4.  Continue Apixaban  (Eliquis ).  No missed doses.    Follow up 2 weeks after DCCV.   Terra Pac, PA-C  [facility] [address] [place], [place] [address] [phone]

## 2024-09-15 NOTE — Progress Notes (Signed)
 Remote PPM Transmission

## 2024-09-17 ENCOUNTER — Other Ambulatory Visit: Payer: Self-pay | Admitting: Cardiovascular Disease

## 2024-09-17 DIAGNOSIS — I480 Paroxysmal atrial fibrillation: Secondary | ICD-10-CM

## 2024-09-17 NOTE — Telephone Encounter (Signed)
 Eliquis  5mg  refill request received. Patient is 89 years old, weight-77.7kg, Crea-1.36 on 09/14/24, Diagnosis-Afib, and last seen by Orlando Heinrich on 09/14/24. Dose is appropriate based on dosing criteria. Will send in refill to requested pharmacy.

## 2024-09-18 ENCOUNTER — Ambulatory Visit: Payer: Self-pay | Admitting: Cardiovascular Disease

## 2024-09-28 ENCOUNTER — Encounter: Payer: Self-pay | Admitting: Cardiovascular Disease

## 2024-09-28 NOTE — Telephone Encounter (Signed)
 I spoke with patient and his wife over the phone. Patient just feels very weak but BP is good 140s/70s HR 95. Denies swelling/wt gain, chest pain -- just feels weak. He is now on Amiodarone 200mg  once a day. ER precautions were given to the patient and his wife. Offered to move cardioversion up to tomorrow but pt declined would wait for the dccv scheduled with Dr Francyne.

## 2024-09-29 ENCOUNTER — Telehealth: Payer: Self-pay | Admitting: *Deleted

## 2024-09-29 ENCOUNTER — Telehealth (HOSPITAL_COMMUNITY): Payer: Self-pay | Admitting: *Deleted

## 2024-09-29 NOTE — Anesthesia Preprocedure Evaluation (Addendum)
 Anesthesia Evaluation  Patient identified by MRN, date of birth, ID band Patient awake    Reviewed: Allergy & Precautions, NPO status , Patient's Chart, lab work & pertinent test results  Airway Mallampati: II  TM Distance: >3 FB Neck ROM: Full    Dental no notable dental hx. (+) Upper Dentures, Dental Advisory Given   Pulmonary    Pulmonary exam normal breath sounds clear to auscultation       Cardiovascular hypertension, (-) angina + CAD and + CABG  (-) Past MI Normal cardiovascular exam+ dysrhythmias Atrial Fibrillation and Supra Ventricular Tachycardia + pacemaker + Valvular Problems/Murmurs AI  Rhythm:Irregular Rate:Bradycardia  Left ventricular ejection fraction, by estimation, is 60 to 65%. The    Neuro/Psych  Neuromuscular disease    GI/Hepatic Neg liver ROS,,,  Endo/Other  Hypothyroidism    Renal/GU Lab Results      Component                Value               Date                            K                        4.4                 09/14/2024                           CREATININE               1.36 (H)            09/14/2024                E     Musculoskeletal   Abdominal   Peds  Hematology Lab Results      Component                Value               Date                      WBC                      7.8                 09/14/2024                HGB                      13.8                09/14/2024                HCT                      42.3                09/14/2024                MCV                      89                  09/14/2024  PLT                      139 (L)             09/14/2024              Anesthesia Other Findings All: latex sulfa  Reproductive/Obstetrics                              Anesthesia Physical Anesthesia Plan  ASA: 3  Anesthesia Plan: General   Post-op Pain Management: Minimal or no pain anticipated   Induction:  Intravenous  PONV Risk Score and Plan: Propofol  infusion, Treatment may vary due to age or medical condition and Ondansetron   Airway Management Planned: Mask  Additional Equipment: None  Intra-op Plan:   Post-operative Plan:   Informed Consent: I have reviewed the patients History and Physical, chart, labs and discussed the procedure including the risks, benefits and alternatives for the proposed anesthesia with the patient or authorized representative who has indicated his/her understanding and acceptance.     Dental advisory given  Plan Discussed with: CRNA  Anesthesia Plan Comments:          Anesthesia Quick Evaluation

## 2024-09-29 NOTE — Telephone Encounter (Signed)
 Pt's wife called and reported pt slid off the toilet onto the floor landing on right side does not think his head was hit because it was upright when she went in.  Wife and son had to help him back up. She report abrasion to right elbow only injury. Bp 150/90 and HR 90 when son checked it. Wife thinks he slid off toilet because he is weak. They did send transmission from device which I asked them to look at. Pt is scheduled for DCCV tomorrow. I did advise probably needs to go ER if she was not certain about hitting his head. She denies LOC. I did send message to Dr. Francyne and [facility]. for an update. Wife at home with pt and will continue to monitor.

## 2024-09-29 NOTE — Telephone Encounter (Signed)
 Received the following secure chat message:    This RN reviewed patient's remote transmission and found no new alerts and no new episodes  Patient's presenting EGM c/w AF/AFL with fairly controlled V-rates  Patient scheduled for DCCV tomorrow morning  Returned phone call to patient's wife and notified her of findings  All questions and concerns addressed at time of phone call  Patient's spouse very appreciative of call

## 2024-09-30 ENCOUNTER — Encounter (HOSPITAL_COMMUNITY): Payer: Self-pay | Admitting: Anesthesiology

## 2024-09-30 ENCOUNTER — Ambulatory Visit (HOSPITAL_COMMUNITY)
Admission: RE | Admit: 2024-09-30 | Discharge: 2024-09-30 | Disposition: A | Discharge status: Home / Self Care | Attending: Cardiovascular Disease | Admitting: Cardiovascular Disease

## 2024-09-30 ENCOUNTER — Other Ambulatory Visit: Payer: Self-pay

## 2024-09-30 ENCOUNTER — Ambulatory Visit (HOSPITAL_BASED_OUTPATIENT_CLINIC_OR_DEPARTMENT_OTHER): Payer: Self-pay | Admitting: Anesthesiology

## 2024-09-30 ENCOUNTER — Encounter (HOSPITAL_COMMUNITY): Payer: Self-pay | Admitting: Cardiovascular Disease

## 2024-09-30 ENCOUNTER — Encounter (HOSPITAL_COMMUNITY): Admission: RE | Disposition: A | Payer: Self-pay | Source: Home / Self Care | Attending: Cardiovascular Disease

## 2024-09-30 DIAGNOSIS — I480 Paroxysmal atrial fibrillation: Secondary | ICD-10-CM

## 2024-09-30 DIAGNOSIS — E7800 Pure hypercholesterolemia, unspecified: Secondary | ICD-10-CM

## 2024-09-30 DIAGNOSIS — I4819 Other persistent atrial fibrillation: Secondary | ICD-10-CM

## 2024-09-30 DIAGNOSIS — I4892 Unspecified atrial flutter: Secondary | ICD-10-CM | POA: Diagnosis not present

## 2024-09-30 DIAGNOSIS — I2510 Atherosclerotic heart disease of native coronary artery without angina pectoris: Secondary | ICD-10-CM

## 2024-09-30 DIAGNOSIS — E039 Hypothyroidism, unspecified: Secondary | ICD-10-CM | POA: Diagnosis not present

## 2024-09-30 DIAGNOSIS — Z79899 Other long term (current) drug therapy: Secondary | ICD-10-CM | POA: Diagnosis not present

## 2024-09-30 DIAGNOSIS — D6869 Other thrombophilia: Secondary | ICD-10-CM | POA: Insufficient documentation

## 2024-09-30 DIAGNOSIS — I10 Essential (primary) hypertension: Secondary | ICD-10-CM | POA: Insufficient documentation

## 2024-09-30 DIAGNOSIS — Z7901 Long term (current) use of anticoagulants: Secondary | ICD-10-CM | POA: Insufficient documentation

## 2024-09-30 DIAGNOSIS — Z951 Presence of aortocoronary bypass graft: Secondary | ICD-10-CM | POA: Diagnosis not present

## 2024-09-30 HISTORY — PX: CARDIOVERSION: EP1203

## 2024-09-30 SURGERY — CARDIOVERSION (CATH LAB)
Anesthesia: General

## 2024-09-30 MED ORDER — PROPOFOL 10 MG/ML IV BOLUS
INTRAVENOUS | Status: DC | PRN
  Administered 2024-09-30: 50 mg via INTRAVENOUS

## 2024-09-30 MED ORDER — LIDOCAINE 2% (20 MG/ML) 5 ML SYRINGE
INTRAMUSCULAR | Status: DC | PRN
  Administered 2024-09-30: 50 mg via INTRAVENOUS

## 2024-09-30 SURGICAL SUPPLY — 1 items: PAD DEFIB RADIO PHYSIO CONN (PAD) ×2 IMPLANT

## 2024-09-30 NOTE — Op Note (Addendum)
 Procedure: Electrical Cardioversion Indications:  Atrial Fibrillation  Procedure Details:  Consent: Risks of procedure as well as the alternatives and risks of each were explained to the (patient/caregiver).  Consent for procedure obtained.  Time Out: Verified patient identification, verified procedure, site/side was marked, verified correct patient position, special equipment/implants available, medications/allergies/relevent history reviewed, required imaging and test results available.  Performed  Patient placed on cardiac monitor, pulse oximetry, supplemental oxygen as necessary.  Sedation given: propofol  50 mg IV, Dr. Julieann Pacer pads placed anterior and posterior chest.  Cardioverted 1 time(s).  Cardioversion with synchronized biphasic 200J shock.  Evaluation: Findings: Post procedure EKG shows: A paced, V sensed with frequent PACs Complications: None Patient did tolerate procedure well.  Pacemaker reprogrammed with longer AV delay (sensed 270 ms, paced 290 ms) ,to allow intrinsic conduction. Post mode switch overdrive pacing at 90 bpm and atrial preference pacing on at 95 bpm.  Otw normal pacemaker function before and after the procedure.  Time Spent Directly with the Patient:  30 minutes   Martha Soltys 09/30/2024, 7:58 AM

## 2024-09-30 NOTE — Transfer of Care (Signed)
 Immediate Anesthesia Transfer of Care Note  Patient: [name]  Procedure(s) Performed: CARDIOVERSION  Patient Location: PACU  Anesthesia Type:General  Level of Consciousness: awake, alert , and oriented  Airway & Oxygen Therapy: Patient Spontanous Breathing and Patient connected to nasal cannula oxygen  Post-op Assessment: Report given to RN and Post -op Vital signs reviewed and stable  Post vital signs: Reviewed and stable  Last Vitals:  Vitals Value Taken Time  BP 137/65 09/30/24 07:58  Temp 36.1 C 09/30/24 07:58  Pulse 90 09/30/24 07:58  Resp 12 09/30/24 07:58  SpO2 99 % 09/30/24 07:58    Last Pain:  Vitals:   09/30/24 0758  TempSrc: Tympanic  PainSc: Asleep         Complications: No notable events documented.

## 2024-09-30 NOTE — Interval H&P Note (Signed)
 History and Physical Interval Note:  09/30/2024 7:42 AM  [name]  has presented today for surgery, with the diagnosis of AFIB.  The various methods of treatment have been discussed with the patient and family. After consideration of risks, benefits and other options for treatment, the patient has consented to  Procedure(s): CARDIOVERSION (N/A) as a surgical intervention.  The patient's history has been reviewed, patient examined, no change in status, stable for surgery.  I have reviewed the patient's chart and labs.  Questions were answered to the patient's satisfaction.     Jordi Lacko

## 2024-09-30 NOTE — Discharge Instructions (Signed)

## 2024-09-30 NOTE — Anesthesia Postprocedure Evaluation (Signed)
 Anesthesia Post Note  Patient: [name]  Procedure(s) Performed: CARDIOVERSION     Patient location during evaluation: Cath Lab Anesthesia Type: General Level of consciousness: awake and alert Pain management: pain level controlled Vital Signs Assessment: post-procedure vital signs reviewed and stable Respiratory status: spontaneous breathing, nonlabored ventilation, respiratory function stable and patient connected to nasal cannula oxygen Cardiovascular status: blood pressure returned to baseline and stable Postop Assessment: no apparent nausea or vomiting Anesthetic complications: no   There were no known notable events for this encounter.  Last Vitals:  Vitals:   09/30/24 0808 09/30/24 0818  BP: 127/66 (!) 140/70  Pulse: 67 74  Resp: 20 20  Temp:    SpO2: 98% 96%    Last Pain:  Vitals:   09/30/24 0818  TempSrc:   PainSc: 0-No pain                 Garnette DELENA Gab

## 2024-10-01 ENCOUNTER — Telehealth: Payer: Self-pay | Admitting: Emergency Medicine

## 2024-10-01 NOTE — Telephone Encounter (Signed)
 Croitoru, Jerel, MD  [name]; Ian Glade RAMAN, RN; Davee Comer CROME, RN Can we please get him in to see Thom the week after Thanksgiving? He is not doing so well on the amio. Rather shaky and unsteady. If maintaining normal rhythm would like to decrease the amio to 100 mg daily at the AFib clinic visit. Then please have him come and see me on Dec 12.  S/w the patient- appt made to f/u with Dr Francyne 10/22/24 at 10:00. He verbalized understanding.

## 2024-10-13 ENCOUNTER — Ambulatory Visit (HOSPITAL_COMMUNITY)
Admission: RE | Admit: 2024-10-13 | Discharge: 2024-10-13 | Disposition: A | Source: Ambulatory Visit | Attending: Internal Medicine | Admitting: Internal Medicine

## 2024-10-13 ENCOUNTER — Encounter (HOSPITAL_COMMUNITY): Payer: Self-pay | Admitting: Internal Medicine

## 2024-10-13 VITALS — BP 132/76 | HR 74 | Ht 69.0 in | Wt 164.0 lb

## 2024-10-13 DIAGNOSIS — I4819 Other persistent atrial fibrillation: Secondary | ICD-10-CM

## 2024-10-13 DIAGNOSIS — D6869 Other thrombophilia: Secondary | ICD-10-CM | POA: Diagnosis not present

## 2024-10-13 DIAGNOSIS — Z79899 Other long term (current) drug therapy: Secondary | ICD-10-CM

## 2024-10-13 DIAGNOSIS — I480 Paroxysmal atrial fibrillation: Secondary | ICD-10-CM

## 2024-10-13 DIAGNOSIS — Z5181 Encounter for therapeutic drug level monitoring: Secondary | ICD-10-CM | POA: Diagnosis not present

## 2024-10-13 NOTE — Progress Notes (Signed)
 Primary Care Physician: Rudolpho Norleen BIRCH, MD Primary Cardiologist: Jerel Balding, MD Electrophysiologist: None     Referring Physician: Device clinic     [name] is a 89 y.o. male with a history of CAD s/p CABG, sinus node dysfunction s/p PPM 2007, HTN, SVT, and paroxysmal atrial fibrillation who presents for consultation in the [facility]. Patient contacted device clinic 6/19 noting to feel unwell and device interrogation showed Afib with RVR then converted to NSR. Also noted patient saw PCP same day and prescribed doxycycline  for URI. Patient is on Eliquis  5 mg BID for a CHADS2VASC score of 4.  On follow up 09/14/24, patient is currently in atrial flutter. He began amiodarone  load by Dr. Balding and is scheduled for cardioversion on 11/20. He has not missed any doses of Eliquis  5 mg BID.  On follow-up 10/13/2024, patient is currently in atrial flutter.  S/p successful DCCV on 09/30/2024.  Patient is currently taking amiodarone  200 mg daily.  Wife notes he could not tolerate amiodarone  load previously due to adverse effects.  She notes it is likely he has been out of rhythm for the past several days.  No missed doses of Eliquis .  Today, he denies symptoms of palpitations, chest pain, shortness of breath, orthopnea, PND, lower extremity edema, dizziness, presyncope, syncope, snoring, daytime somnolence, bleeding, or neurologic sequela. The patient is tolerating medications without difficulties and is otherwise without complaint today.    he has a BMI of Body mass index is 24.22 kg/m.SABRA Filed Weights   10/13/24 1050  Weight: 74.4 kg     Current Outpatient Medications  Medication Sig Dispense Refill   amiodarone  (PACERONE ) 200 MG tablet Take 400 mg daily for 3 weeks then take 200 mg daily. 50 tablet 3   apixaban  (ELIQUIS ) 5 MG TABS tablet TAKE 1 TABLET BY MOUTH TWICE DAILY 60 tablet 11   cyanocobalamin (VITAMIN B12) 1000 MCG tablet Take 1,000 mcg by  mouth daily.     levothyroxine (SYNTHROID) 125 MCG tablet Take 125 mcg by mouth daily before breakfast.     metoprolol  succinate (TOPROL -XL) 25 MG 24 hr tablet Take 1 tablet (25 mg total) by mouth daily. 90 tablet 3   metoprolol  tartrate (LOPRESSOR ) 25 MG tablet Take 0.5 tablets (12.5 mg total) by mouth 2 (two) times daily as needed (take as needed for palpitations). 60 tablet 1   rosuvastatin  (CRESTOR ) 10 MG tablet TAKE 1 TABLET BY MOUTH DAILY 90 tablet 3   No current facility-administered medications for this encounter.    Atrial Fibrillation Management history:  Previous antiarrhythmic drugs: amiodarone  Previous cardioversions: 07/28/2024, 09/30/2024 Previous ablations: none Anticoagulation history: Eliquis    ROS- All systems are reviewed and negative except as per the HPI above.  Physical Exam: BP 132/76   Pulse 74   Ht 5' 9 (1.753 m)   Wt 74.4 kg   BMI 24.22 kg/m   GEN- The patient is well appearing, alert and oriented x 3 today.   Neck - no JVD or carotid bruit noted Lungs- Clear to ausculation bilaterally, normal work of breathing Heart-Regular rate (V paced), no murmurs, rubs or gallops, PMI not laterally displaced Extremities- no clubbing, cyanosis, or edema Skin - no rash or ecchymosis noted   EKG today demonstrates  EKG Interpretation Date/Time:  Wednesday October 13 2024 10:53:03 EST Ventricular Rate:  74 PR Interval:  240 QRS Duration:  158 QT Interval:  450 QTC Calculation: 499 R Axis:   -62  Text  Interpretation: V paced rhythm with likely underlying atrial flutter Abnormal ECG When compared with ECG of 30-Sep-2024 07:59, PREVIOUS ECG IS PRESENT Confirmed by Terra Pac ([id number]) on 10/13/2024 11:17:51 AM    Echo 05/03/22 demonstrated  1. Left ventricular ejection fraction, by estimation, is 60 to 65%. The  left ventricle has normal function. The left ventricle has no regional  wall motion abnormalities. There is moderate asymmetric left ventricular   hypertrophy of the basal-septal  segment. Left ventricular diastolic parameters are consistent with Grade  II diastolic dysfunction (pseudonormalization).   2. Right ventricular systolic function is normal. The right ventricular  size is normal. There is mildly elevated pulmonary artery systolic  pressure.   3. Left atrial size was moderately dilated.   4. The mitral valve is degenerative. Mild mitral valve regurgitation. No  evidence of mitral stenosis.   5. Tricuspid valve regurgitation is mild to moderate.   6. The aortic valve is tricuspid. There is mild calcification of the  aortic valve. Aortic valve regurgitation is mild. Aortic valve  sclerosis/calcification is present, without any evidence of aortic  stenosis.   7. Aortic dilatation noted. There is borderline dilatation of the aortic  root, measuring 39 mm.   8. The inferior vena cava is normal in size with greater than 50%  respiratory variability, suggesting right atrial pressure of 3 mmHg.    ASSESSMENT & PLAN CHA2DS2-VASc Score = 4  The patient's score is based upon: CHF History: 0 HTN History: 1 Diabetes History: 0 Stroke History: 0 Vascular Disease History: 1 Age Score: 2 Gender Score: 0       ASSESSMENT AND PLAN: Persistent Atrial Fibrillation / Atrial Flutter (ICD10:  I48.0) The patient's CHA2DS2-VASc score is 4, indicating a 4.8% annual risk of stroke.   S/p successful DCCV on 09/30/2024.  Patient currently appears to be in the V paced rhythm with underlying atrial flutter.  We discussed with his inability to previously tolerate an amiodarone  load that the consideration of reloading amiodarone  would be unfavorable.  I discussed with patient that due to advanced age he is likely not a candidate for ablation.  Considering amiodarone  is unable to keep him in sinus rhythm, I do not have other antiarrhythmic options available at this time.  It would appear unlikely that Tikosyn would help to keep him in sinus  rhythm if he is failing amiodarone .  We discussed rate control strategy but will defer further conversation with primary cardiologist.  Continue Toprol  25 mg daily.   High risk medication monitoring (ICD10: J342684) Patient requires ongoing monitoring for anti-arrhythmic medication which has the potential to cause life threatening arrhythmias or AV block. Qtc stable. Continue amiodarone  200 mg daily.   Secondary Hypercoagulable State (ICD10:  D68.69) The patient is at significant risk for stroke/thromboembolism based upon his CHA2DS2-VASc Score of 4.  Continue Apixaban  (Eliquis ).  No missed doses.    Follow up as scheduled with cardiologist.   Terra Pac, PA-C  [facility] [address] [place], [place] [address] [phone]

## 2024-10-13 NOTE — Progress Notes (Signed)
 Thanks, Thom.  At this point my inclination is to recommend rate control strategy.

## 2024-10-14 ENCOUNTER — Ambulatory Visit (HOSPITAL_COMMUNITY): Payer: Self-pay | Admitting: Internal Medicine

## 2024-10-14 LAB — BASIC METABOLIC PANEL WITH GFR
BUN/Creatinine Ratio: 15 (ref 10–24)
BUN: 18 mg/dL (ref 8–27)
CO2: 21 mmol/L (ref 20–29)
Calcium: 9.1 mg/dL (ref 8.6–10.2)
Chloride: 101 mmol/L (ref 96–106)
Creatinine, Ser: 1.22 mg/dL (ref 0.76–1.27)
Glucose: 97 mg/dL (ref 70–99)
Potassium: 5.2 mmol/L (ref 3.5–5.2)
Sodium: 141 mmol/L (ref 134–144)
eGFR: 57 mL/min/1.73 — ABNORMAL LOW (ref >59)

## 2024-10-18 ENCOUNTER — Other Ambulatory Visit: Payer: Self-pay | Admitting: Cardiovascular Disease

## 2024-10-22 ENCOUNTER — Encounter: Payer: Self-pay | Admitting: Cardiovascular Disease

## 2024-10-22 ENCOUNTER — Ambulatory Visit: Attending: Cardiovascular Disease | Admitting: Cardiovascular Disease

## 2024-10-22 ENCOUNTER — Telehealth: Payer: Self-pay

## 2024-10-22 VITALS — BP 118/60 | HR 71 | Ht 68.0 in | Wt 164.0 lb

## 2024-10-22 DIAGNOSIS — I4819 Other persistent atrial fibrillation: Secondary | ICD-10-CM | POA: Diagnosis not present

## 2024-10-22 DIAGNOSIS — E7800 Pure hypercholesterolemia, unspecified: Secondary | ICD-10-CM | POA: Diagnosis not present

## 2024-10-22 DIAGNOSIS — I495 Sick sinus syndrome: Secondary | ICD-10-CM | POA: Diagnosis not present

## 2024-10-22 DIAGNOSIS — Z950 Presence of cardiac pacemaker: Secondary | ICD-10-CM

## 2024-10-22 DIAGNOSIS — I10 Essential (primary) hypertension: Secondary | ICD-10-CM | POA: Diagnosis not present

## 2024-10-22 DIAGNOSIS — D6869 Other thrombophilia: Secondary | ICD-10-CM

## 2024-10-22 DIAGNOSIS — E039 Hypothyroidism, unspecified: Secondary | ICD-10-CM

## 2024-10-22 DIAGNOSIS — I25810 Atherosclerosis of coronary artery bypass graft(s) without angina pectoris: Secondary | ICD-10-CM

## 2024-10-22 NOTE — Progress Notes (Signed)
 Cardiology Office Note    Date:  10/22/2024   ID:  [name], DOB 10/18/1933, MRN [id number]  PCP:  Rudolpho Norleen BIRCH, MD  Cardiologist:   Jerel Balding, MD   Chief Complaint  Patient presents with   Atrial Fibrillation    History of Present Illness:  [name] is a 89 y.o. male with CAD s/p CABG, sinus node dysfunction and persistent atrial fibrillation, status post dual-chamber permanent pacemaker implantation in 2007 (generator change 2015, Medtronic Adapta, atrial lead 5076, ventricular lead 4092) returning for follow-up after undergoing a second direct-current cardioversion for persistent atrial fibrillation on 09/30/2024 after a 1 month course of amiodarone  (previous early recurrence of atrial fibrillation after cardioversion 07/28/2024).  He is again back in atrial fibrillation.  He feels very poorly on amiodarone .  Symptoms were particularly severe when he was taking the 400 mg daily dose where he had a lot of issues with tremor, unsteadiness and overall malaise.  Also has a persistent nonproductive cough.  Symptoms are a little bit better on the 200 mg daily dose, but he still feels poorly.  He only maintained normal rhythm for about 4-5 days after the cardioversion (only slightly better than the 48 hours he had with the first cardioversion without amiodarone ).  Complains of fatigue, but denies frank dyspnea and has not had chest pain or syncope.  Has not had edema.  On pacemaker check presenting rhythm is atrial fibrillation with intermittent ventricular pacing.  His current battery longevity is estimated at approximately 3 years (Medtronic Adapta dual-chamber device).  As before he has more ventricular pacing and atrial fibrillation that he did while he was in atrial paced rhythm, now up to 32%.  Ventricular rate control is fair, although occasional heart rates in the 120s are seen.  The atrial electrogram showed a relatively regular atrial flutter like rhythm with a cycle  length of 220 ms.  Attempts to overdrive pace it today were repeatedly unsuccessful.  He still has the original leads from 2007 (atrial lead Medtronic 5076, ventricularly Medtronic [id number]) with excellent parameters.  The ventricular lead is not MRI conditional.  Heart rate histogram distribution is appropriate.    It's been about 17 years since he underwent bypass surgery.  It is important to note that he had no angina pectoris before bypass.  He experienced fatigue and dyspnea, feeling washed out with work-up eventually leading to bypass surgery.  We rechecked an echocardiogram and a Lexiscan  Myoview  in June 2023.  He had a normal pattern of myocardial perfusion.  LV systolic function was normal by both echo and quantitative scintigraphy.  His left atrium was moderately dilated.  As before he has a borderline elevated pulmonary artery pressure of about 38 mmHg, without any abnormalities of the right heart.  The ascending aorta is borderline dilated, unchanged from previous echo.  Normal left ventricular systolic function by echo most recently performed in June 2023 last nuclear stress test in 2012 was normal. He underwent bypass surgery in 2007 (Dr. Lucas 2007, LIMA to LAD, SVG to OM, SVG to RCA). His pacemaker is a Medtronic Adapta dual-chamber device implanted in 2015 (leads from 2007). He has treated hyperlipidemia, followed by his primary care physician. He has mild thrombocytopenia without bleeding complications.  Past Medical History:  Diagnosis Date   Arthritis    Hyperlipidemia    Hypertension    Hypothyroidism    Paroxysmal atrial fibrillation (HCC) 11/22/2014   Thrombocytopenia 05/23/2015    Past Surgical History:  Procedure Laterality Date   bilateral carotid duplex ultrasound showed no ICA     stenosis   CARDIAC CATHETERIZATION     Dr. Herminio which showed a 95% ostial LAD stenosis   CARDIOVERSION N/A 07/28/2024   Procedure: CARDIOVERSION;  Surgeon: Francyne Headland, MD;   Location: [facility] INVASIVE CV LAB;  Service: Cardiovascular;  Laterality: N/A;   CARDIOVERSION N/A 09/30/2024   Procedure: CARDIOVERSION;  Surgeon: Francyne Headland, MD;  Location: [facility] INVASIVE CV LAB;  Service: Cardiovascular;  Laterality: N/A;   CORONARY ARTERY BYPASS GRAFT     x3 using a left internal mammary artery   PACEMAKER GENERATOR CHANGE N/A 08/09/2014   Procedure: PACEMAKER GENERATOR CHANGE CHADWICK;  Surgeon: Headland Francyne, MD;  Location: [facility] CATH LAB;  Service: Cardiovascular;  Laterality: N/A;   Placement of permanent pacemaker     US  ECHOCARDIOGRAPHY     lft ventricular ejection fraction was about 60%    Current Medications: Outpatient Medications Prior to Visit  Medication Sig Dispense Refill   apixaban  (ELIQUIS ) 5 MG TABS tablet TAKE 1 TABLET BY MOUTH TWICE DAILY 60 tablet 11   cyanocobalamin (VITAMIN B12) 1000 MCG tablet Take 1,000 mcg by mouth daily.     levothyroxine (SYNTHROID) 125 MCG tablet Take 125 mcg by mouth daily before breakfast.     metoprolol  succinate (TOPROL -XL) 25 MG 24 hr tablet Take 1 tablet (25 mg total) by mouth daily. 90 tablet 3   metoprolol  tartrate (LOPRESSOR ) 25 MG tablet Take 0.5 tablets (12.5 mg total) by mouth 2 (two) times daily as needed (take as needed for palpitations). 60 tablet 1   rosuvastatin  (CRESTOR ) 10 MG tablet TAKE 1 TABLET BY MOUTH DAILY 90 tablet 3   amiodarone  (PACERONE ) 200 MG tablet Take 400 mg daily for 3 weeks then take 200 mg daily. 50 tablet 3   No facility-administered medications prior to visit.     Allergies:   Latex and Sulfa antibiotics  Family History:  The patient's family history includes Hypertension in his mother; Stroke in his mother.   ROS:   Please see the history of present illness.    ROS All other systems are reviewed and are negative.  PHYSICAL EXAM:   VS:  BP 118/60 (BP Location: Left Arm, Patient Position: Sitting, Cuff Size: Normal)   Pulse 71   Ht 5' 8 (1.727 m)   Wt 164 lb (74.4 kg)   SpO2 95%    BMI 24.94 kg/m      General: Alert, oriented x3, no distress, healthy left subclavian pacemaker site Head: no evidence of trauma, PERRL, EOMI, no exophtalmos or lid lag, no myxedema, no xanthelasma; normal ears, nose and oropharynx Neck: normal jugular venous pulsations and no hepatojugular reflux; brisk carotid pulses without delay and no carotid bruits Chest: clear to auscultation, no signs of consolidation by percussion or palpation, normal fremitus, symmetrical and full respiratory excursions Cardiovascular: normal position and quality of the apical impulse, irregular rhythm, normal first and second heart sounds, no murmurs, rubs or gallops Abdomen: no tenderness or distention, no masses by palpation, no abnormal pulsatility or arterial bruits, normal bowel sounds, no hepatosplenomegaly Extremities: no clubbing, cyanosis or edema; 2+ radial, ulnar and brachial pulses bilaterally; 2+ right femoral, posterior tibial and dorsalis pedis pulses; 2+ left femoral, posterior tibial and dorsalis pedis pulses; no subclavian or femoral bruits Neurological: grossly nonfocal except for mild resting tremor Psych: Normal mood and affect    Wt Readings from Last 3 Encounters:  10/22/24 164 lb (74.4 kg)  10/13/24 164 lb (74.4 kg)  09/30/24 170 lb (77.1 kg)      Studies/Labs Reviewed:  04/22/2022 Lexiscan  Myoview    Findings are consistent with no prior ischemia and no prior myocardial infarction. The study is low risk.   No ST deviation was noted.   LV perfusion is normal. There is no evidence of ischemia.   Left ventricular function is abnormal. There was a single regional abnormality. Nuclear stress EF: 55 %. The left ventricular ejection fraction is normal (55-65%). End diastolic cavity size is normal. End systolic cavity size is normal.   Prior study available for comparison from 02/11/2011.   05/03/2022 echocardiogram   1. Left ventricular ejection fraction, by estimation, is 60 to 65%. The   left ventricle has normal function. The left ventricle has no regional  wall motion abnormalities. There is moderate asymmetric left ventricular  hypertrophy of the basal-septal  segment. Left ventricular diastolic parameters are consistent with Grade  II diastolic dysfunction (pseudonormalization).   2. Right ventricular systolic function is normal. The right ventricular  size is normal. There is mildly elevated pulmonary artery systolic  pressure.   3. Left atrial size was moderately dilated.   4. The mitral valve is degenerative. Mild mitral valve regurgitation. No  evidence of mitral stenosis.   5. Tricuspid valve regurgitation is mild to moderate.   6. The aortic valve is tricuspid. There is mild calcification of the  aortic valve. Aortic valve regurgitation is mild. Aortic valve  sclerosis/calcification is present, without any evidence of aortic  stenosis.   7. Aortic dilatation noted. There is borderline dilatation of the aortic  root, measuring 39 mm.   8. The inferior vena cava is normal in size with greater than 50%  respiratory variability, suggesting right atrial pressure of 3 mmHg.   Comparison(s): 05/11/13 EF 55-60%. PA pressure .   EKG: On pacemaker interrogation he has atrial flutter (probably atypical) with a cycle length of about 220 ms and mostly ventricular sensed, some ventricular paced beats.  EKG Interpretation Date/Time:    Ventricular Rate:    PR Interval:    QRS Duration:    QT Interval:    QTC Calculation:   R Axis:      Text Interpretation:           Recent Labs: 06/17/2023 Cholesterol 139, triglycerides 63, HDL 53, LDL 73 Hemoglobin 84.8, potassium 4.7, creatinine 1.2, normal liver function tests TSH was mildly elevated at 7.181 (in February it was 5.013). TSH 4.186, hemoglobin 14.8, potassium 4.6, creatinine 1.1, normal liver function tests  06/17/2024 Hemoglobin 14.8, creatinine 1.2, potassium 5.0, normal liver function  tests Cholesterol 141, triglycerides 75, HDL 53, LDL 73 TSH 5.637  Lipid Panel     Component Value Date/Time   CHOL 162 09/24/2021 0845   TRIG 89 09/24/2021 0845   HDL 52 09/24/2021 0845   CHOLHDL 3.1 09/24/2021 0845   LDLCALC 93 09/24/2021 0845   LABVLDL 17 09/24/2021 0845   BMET    Component Value Date/Time   NA 141 10/13/2024 1150   K 5.2 10/13/2024 1150   CL 101 10/13/2024 1150   CO2 21 10/13/2024 1150   GLUCOSE 97 10/13/2024 1150   GLUCOSE 92 01/13/2010 0628   BUN 18 10/13/2024 1150   CREATININE 1.22 10/13/2024 1150   CALCIUM  9.1 10/13/2024 1150   EGFR 57 (L) 10/13/2024 1150   GFRNONAA 55 (L) 12/09/2017 0901    ASSESSMENT:    1. Persistent atrial fibrillation (HCC)   2.  Coronary artery disease involving coronary bypass graft of native heart without angina pectoris   3. Acquired thrombophilia   4. SSS (sick sinus syndrome) (HCC)   5. Pacemaker   6. Essential hypertension   7. Hypercholesterolemia   8. Acquired hypothyroidism        PLAN:  In order of problems listed above:  AFib: Early recurrence after 2 cardioversion attempts, the second after loading with amiodarone .  Amiodarone  is not well-tolerated and we will stop it.  He does not have a lot of choices for antiarrhythmic therapy due to his history of coronary disease.  We have offered dofetilide but he does not want to be hospitalized for 3 days.  At this point, we need to allow amiodarone  washout before we would consider dofetilide again.  Also briefly discussed ablation, but that would be an unusual procedure at age 46.  Suspect we will manage this going forward as permanent atrial fibrillation with rate control and anticoagulation.  CHADSVasc 4 (age 69, HTN, CAD).  A CAD s/p CABG: Mains asymptomatic, but never really had angina pectoris even before he had bypass surgery.  Low risk recent nuclear stress test in June 2023.  Normal left ventricular systolic function. Eliquis : Denies falls or bleeding  complications.   SSS: Indication for pacemaker implantation before he developed persistent atrial fibrillation. PPM: Normal pacemaker function today.  Continue remote downloads every 3 months.  He has to go to an Internet store to do his downloads, since he has poor reception at home. HTN: Appropriate control.  No changes made. HLP: Target LDL under 70; most recently was close to that at 73.  Continue rosuvastatin . Hypothyroidism: Hopefully the brief course of amiodarone  will not affect treatment for this in any way.   Informed Consent   Shared Decision Making/Informed Consent The risks (stroke, cardiac arrhythmias rarely resulting in the need for a temporary or permanent pacemaker, skin irritation or burns and complications associated with conscious sedation including aspiration, arrhythmia, respiratory failure and death), benefits (restoration of normal sinus rhythm) and alternatives of a direct current cardioversion were explained in detail to Mr. Dahmen and he agrees to proceed.         Medication Adjustments/Labs and Tests Ordered: Current medicines are reviewed at length with the patient today.  Concerns regarding medicines are outlined above.  Medication changes, Labs and Tests ordered today are listed in the Patient Instructions below. Patient Instructions  Medication Instructions:  Stop Amiodarone  *If you need a refill on your cardiac medications before your next appointment, please call your pharmacy*  Lab Work: None ordered If you have labs (blood work) drawn today and your tests are completely normal, you will receive your results only by: MyChart Message (if you have MyChart) OR A paper copy in the mail If you have any lab test that is abnormal or we need to change your treatment, we will call you to review the results.  Testing/Procedures: None ordered  Follow-Up: At [facility], you and your health needs are our priority.  As part of our continuing mission to  provide you with exceptional heart care, our providers are all part of one team.  This team includes your primary Cardiologist (physician) and Advanced Practice Providers or APPs (Physician Assistants and Nurse Practitioners) who all work together to provide you with the care you need, when you need it.  Your next appointment:    3-4 months  Provider:   Jerel Balding, MD    We recommend signing up for  the patient portal called MyChart.  Sign up information is provided on this After Visit Summary.  MyChart is used to connect with patients for Virtual Visits (Telemedicine).  Patients are able to view lab/test results, encounter notes, upcoming appointments, etc.  Non-urgent messages can be sent to your provider as well.   To learn more about what you can do with MyChart, go to [email or web address].      Signed, Jerel Balding, MD  10/22/2024 1:44 PM    [facility] [address] [place], [place], [place]  [address] Phone: [phone]; Fax: [phone]

## 2024-10-22 NOTE — Telephone Encounter (Signed)
 S/w the patient and he was asking about his eGFR- informed him that his most recent value is better than it was a month ago. Instructed him to make sure he is drinking well/staying well hydrated. He said he will try to do better with that, he said that he definitely does not drink enough now.

## 2024-10-22 NOTE — Patient Instructions (Signed)
 Medication Instructions:  Stop Amiodarone  *If you need a refill on your cardiac medications before your next appointment, please call your pharmacy*  Lab Work: None ordered If you have labs (blood work) drawn today and your tests are completely normal, you will receive your results only by: MyChart Message (if you have MyChart) OR A paper copy in the mail If you have any lab test that is abnormal or we need to change your treatment, we will call you to review the results.  Testing/Procedures: None ordered  Follow-Up: At [facility], you and your health needs are our priority.  As part of our continuing mission to provide you with exceptional heart care, our providers are all part of one team.  This team includes your primary Cardiologist (physician) and Advanced Practice Providers or APPs (Physician Assistants and Nurse Practitioners) who all work together to provide you with the care you need, when you need it.  Your next appointment:    3-4 months  Provider:   Jerel Balding, MD    We recommend signing up for the patient portal called MyChart.  Sign up information is provided on this After Visit Summary.  MyChart is used to connect with patients for Virtual Visits (Telemedicine).  Patients are able to view lab/test results, encounter notes, upcoming appointments, etc.  Non-urgent messages can be sent to your provider as well.   To learn more about what you can do with MyChart, go to [email or web address].

## 2024-10-22 NOTE — Telephone Encounter (Signed)
 Pt called in with questions about his labs and would like a call back

## 2024-10-29 MED ORDER — BENZONATATE 100 MG PO CAPS: 100.0000 mg | ORAL_CAPSULE | Freq: Three times a day (TID) | ORAL | 1 refills | Status: DC | PRN

## 2024-10-29 NOTE — Telephone Encounter (Signed)
 This may not be amiodarone  related, but if it is it will take a long time to resolve (a few weeks). Please prescribe benzonatate  100 mg capsules.  Take 1 or 2 every 8 hours as needed for cough.  Make sure to swallow whole and not to chew the capsules.#45, 1 refill

## 2024-10-29 NOTE — Telephone Encounter (Signed)
 Left detailed message for wife advising of new prescription.  Prescription sent to [facility].  Left call back number if any questions.

## 2024-10-29 NOTE — Telephone Encounter (Signed)
 Spoke with wife.  She received message and has picked up prescription.

## 2024-10-29 NOTE — Telephone Encounter (Signed)
 Received call from wife.  Per wife Pt is having a hard hacking cough that is really taking a toll on him and would like Dr. JAYSON to prescribe him something for it.  Per wife Pt is also largely immobile and having shortness of breath and  cough all the bad side effects of amiodarone .    Pt discontinued amiodarone  at last office visit.  Advised would forward to Dr. JAYSON for review.

## 2024-10-31 ENCOUNTER — Other Ambulatory Visit: Payer: Self-pay

## 2024-10-31 ENCOUNTER — Inpatient Hospital Stay (HOSPITAL_COMMUNITY): Admission: EM | Admit: 2024-10-31 | Discharge: 2024-11-11 | Disposition: E

## 2024-10-31 ENCOUNTER — Emergency Department (HOSPITAL_COMMUNITY)

## 2024-10-31 ENCOUNTER — Encounter (HOSPITAL_COMMUNITY): Payer: Self-pay

## 2024-10-31 DIAGNOSIS — I4819 Other persistent atrial fibrillation: Secondary | ICD-10-CM | POA: Diagnosis present

## 2024-10-31 DIAGNOSIS — E039 Hypothyroidism, unspecified: Secondary | ICD-10-CM | POA: Diagnosis present

## 2024-10-31 DIAGNOSIS — I495 Sick sinus syndrome: Secondary | ICD-10-CM | POA: Diagnosis present

## 2024-10-31 DIAGNOSIS — J9601 Acute respiratory failure with hypoxia: Principal | ICD-10-CM | POA: Diagnosis present

## 2024-10-31 DIAGNOSIS — E871 Hypo-osmolality and hyponatremia: Secondary | ICD-10-CM | POA: Insufficient documentation

## 2024-10-31 DIAGNOSIS — J188 Other pneumonia, unspecified organism: Secondary | ICD-10-CM

## 2024-10-31 DIAGNOSIS — Z950 Presence of cardiac pacemaker: Secondary | ICD-10-CM | POA: Diagnosis present

## 2024-10-31 DIAGNOSIS — I2510 Atherosclerotic heart disease of native coronary artery without angina pectoris: Secondary | ICD-10-CM | POA: Diagnosis present

## 2024-10-31 DIAGNOSIS — I10 Essential (primary) hypertension: Secondary | ICD-10-CM | POA: Diagnosis present

## 2024-10-31 DIAGNOSIS — N401 Enlarged prostate with lower urinary tract symptoms: Secondary | ICD-10-CM | POA: Diagnosis present

## 2024-10-31 LAB — I-STAT CG4 LACTIC ACID, ED
Lactic Acid, Venous: 2.1 mmol/L (ref 0.5–1.9)
Lactic Acid, Venous: 1.5 mmol/L (ref 0.5–1.9)

## 2024-10-31 LAB — RESP PANEL BY RT-PCR (RSV, FLU A&B, COVID)  RVPGX2
Influenza A by PCR: NEGATIVE
Influenza B by PCR: NEGATIVE
Resp Syncytial Virus by PCR: NEGATIVE
SARS Coronavirus 2 by RT PCR: NEGATIVE

## 2024-10-31 LAB — BASIC METABOLIC PANEL WITH GFR
Anion gap: 13 (ref 5–15)
BUN: 17 mg/dL (ref 8–23)
CO2: 19 mmol/L — ABNORMAL LOW (ref 22–32)
Calcium: 8.7 mg/dL — ABNORMAL LOW (ref 8.9–10.3)
Chloride: 93 mmol/L — ABNORMAL LOW (ref 98–111)
Creatinine, Ser: 1.03 mg/dL (ref 0.61–1.24)
GFR, Estimated: >60 mL/min
Glucose, Bld: 149 mg/dL — ABNORMAL HIGH (ref 70–99)
Potassium: 4.4 mmol/L (ref 3.5–5.1)
Sodium: 125 mmol/L — ABNORMAL LOW (ref 135–145)

## 2024-10-31 LAB — URINALYSIS, ROUTINE W REFLEX MICROSCOPIC
Glucose, UA: NEGATIVE mg/dL
Ketones, ur: 15 mg/dL — AB
Leukocytes,Ua: NEGATIVE
Nitrite: NEGATIVE
Protein, ur: 100 mg/dL — AB
Specific Gravity, Urine: >1.03 — ABNORMAL HIGH (ref 1.005–1.030)
pH: 6 (ref 5.0–8.0)

## 2024-10-31 LAB — HEPATIC FUNCTION PANEL
ALT: 21 U/L (ref 0–44)
AST: 36 U/L (ref 15–41)
Albumin: 3 g/dL — ABNORMAL LOW (ref 3.5–5.0)
Alkaline Phosphatase: 83 U/L (ref 38–126)
Bilirubin, Direct: 0.4 mg/dL — ABNORMAL HIGH (ref 0.0–0.2)
Indirect Bilirubin: 0.5 mg/dL (ref 0.3–0.9)
Total Bilirubin: 0.9 mg/dL (ref 0.0–1.2)
Total Protein: 6 g/dL — ABNORMAL LOW (ref 6.5–8.1)

## 2024-10-31 LAB — CBC
HCT: 42.1 % (ref 39.0–52.0)
Hemoglobin: 13.8 g/dL (ref 13.0–17.0)
MCH: 28.1 pg (ref 26.0–34.0)
MCHC: 32.8 g/dL (ref 30.0–36.0)
MCV: 85.7 fL (ref 80.0–100.0)
Platelets: 255 K/uL (ref 150–400)
RBC: 4.91 MIL/uL (ref 4.22–5.81)
RDW: 14.5 % (ref 11.5–15.5)
WBC: 15.2 K/uL — ABNORMAL HIGH (ref 4.0–10.5)
nRBC: 0 % (ref 0.0–0.2)

## 2024-10-31 LAB — URINALYSIS, MICROSCOPIC (REFLEX): Bacteria, UA: NONE SEEN

## 2024-10-31 LAB — PRO BRAIN NATRIURETIC PEPTIDE: Pro Brain Natriuretic Peptide: 3903 pg/mL — ABNORMAL HIGH

## 2024-10-31 LAB — TROPONIN T, HIGH SENSITIVITY
Troponin T High Sensitivity: 20 ng/L — ABNORMAL HIGH (ref 0–19)
Troponin T High Sensitivity: 20 ng/L — ABNORMAL HIGH (ref 0–19)

## 2024-10-31 LAB — PROTIME-INR
INR: 1.7 — ABNORMAL HIGH (ref 0.8–1.2)
Prothrombin Time: 20.4 s — ABNORMAL HIGH (ref 11.4–15.2)

## 2024-10-31 MED ORDER — BENZONATATE 100 MG PO CAPS
200.0000 mg | ORAL_CAPSULE | Freq: Three times a day (TID) | ORAL | Status: DC | PRN
  Administered 2024-10-31 – 2024-11-04 (×7): 200 mg via ORAL
  Filled 2024-10-31 (×7): qty 2

## 2024-10-31 MED ORDER — POLYETHYLENE GLYCOL 3350 17 G PO PACK
17.0000 g | PACK | Freq: Every day | ORAL | Status: DC | PRN
  Administered 2024-11-02 – 2024-11-03 (×2): 17 g via ORAL
  Filled 2024-10-31 (×2): qty 1

## 2024-10-31 MED ORDER — ROSUVASTATIN CALCIUM 5 MG PO TABS
10.0000 mg | ORAL_TABLET | Freq: Every day | ORAL | Status: DC
  Administered 2024-10-31 – 2024-11-04 (×5): 10 mg via ORAL
  Filled 2024-10-31 (×5): qty 2

## 2024-10-31 MED ORDER — APIXABAN 5 MG PO TABS
5.0000 mg | ORAL_TABLET | Freq: Two times a day (BID) | ORAL | Status: DC
  Administered 2024-10-31 – 2024-11-04 (×9): 5 mg via ORAL
  Filled 2024-10-31 (×6): qty 1
  Filled 2024-10-31 (×2): qty 2
  Filled 2024-10-31: qty 1

## 2024-10-31 MED ORDER — SODIUM CHLORIDE 0.9 % IV BOLUS
500.0000 mL | Freq: Once | INTRAVENOUS | Status: AC
  Administered 2024-10-31: 500 mL via INTRAVENOUS

## 2024-10-31 MED ORDER — METOPROLOL SUCCINATE ER 25 MG PO TB24
25.0000 mg | ORAL_TABLET | Freq: Every day | ORAL | Status: DC
  Administered 2024-11-01 – 2024-11-04 (×4): 25 mg via ORAL
  Filled 2024-10-31 (×4): qty 1

## 2024-10-31 MED ORDER — SODIUM CHLORIDE 0.9 % IV SOLN
500.0000 mg | Freq: Once | INTRAVENOUS | Status: AC
  Administered 2024-10-31: 500 mg via INTRAVENOUS
  Filled 2024-10-31: qty 5

## 2024-10-31 MED ORDER — FUROSEMIDE 10 MG/ML IJ SOLN
20.0000 mg | Freq: Once | INTRAMUSCULAR | Status: AC
  Administered 2024-10-31: 20 mg via INTRAVENOUS
  Filled 2024-10-31: qty 2

## 2024-10-31 MED ORDER — AZITHROMYCIN 250 MG PO TABS: 500.0000 mg | ORAL_TABLET | Freq: Every day | ORAL | Status: DC

## 2024-10-31 MED ORDER — SODIUM CHLORIDE 0.9 % IV SOLN
1.0000 g | Freq: Once | INTRAVENOUS | Status: AC
  Administered 2024-10-31: 1 g via INTRAVENOUS
  Filled 2024-10-31: qty 10

## 2024-10-31 MED ORDER — ACETAMINOPHEN 325 MG PO TABS: 650.0000 mg | ORAL_TABLET | Freq: Four times a day (QID) | ORAL | Status: DC | PRN

## 2024-10-31 MED ORDER — LEVOTHYROXINE SODIUM 25 MCG PO TABS
125.0000 ug | ORAL_TABLET | Freq: Every day | ORAL | Status: DC
  Administered 2024-11-01 – 2024-11-05 (×5): 125 ug via ORAL
  Filled 2024-10-31 (×5): qty 1

## 2024-10-31 MED ORDER — SODIUM CHLORIDE 0.9 % IV SOLN: 1.0000 g | INTRAVENOUS | Status: DC

## 2024-10-31 MED ORDER — ACETAMINOPHEN 650 MG RE SUPP: 650.0000 mg | Freq: Four times a day (QID) | RECTAL | Status: DC | PRN

## 2024-10-31 MED ORDER — IPRATROPIUM-ALBUTEROL 0.5-2.5 (3) MG/3ML IN SOLN: 3.0000 mL | Freq: Four times a day (QID) | RESPIRATORY_TRACT | Status: DC | PRN

## 2024-10-31 NOTE — ED Notes (Signed)
 Lactic results to katelyn m.rn by at

## 2024-10-31 NOTE — ED Notes (Signed)
 CCMD notified of order for cardiac monitoring.

## 2024-10-31 NOTE — H&P (Cosign Needed Addendum)
 " Date: 10/31/2024               Patient Name:  [name] MRN: [id number]  DOB: 12/20/1932 Age / Sex: 89 y.o., male   PCP: Rudolpho Norleen BIRCH, MD         Medical Service: Internal Medicine Teaching Service         Attending Physician: Dr. Shawn      First Contact: Intern on Call: [phone]       Second Contact: Resident on [phone]                    SUBJECTIVE   Chief Complaint: Shortness of breath   History of Present Illness: [name] is a 89 year old male with a past medical history of history of CAD s/p CABG, sinus node dysfunction s/p PPM 2007, HTN, SVT, and paroxysmal atrial fibrillation that presents to hospital today due to shortness of breath that has been going on for the last 4-5 days. Patient and his wife at bedside states that they initially thought that this was due to his amiodarone  that he took for about 50 days but this medicine has been discontinued. Patient had contacted cardiology and they had sent in prescription for tessalon  Perles that patient stated helped a little bit with cough.  Patient states that he does not have any fevers or chills reported.  Denies any sick contacts.  His cough is productive with some whitish sputum.  He has had some new orthopnea over the last week which his wife reported as their bed is adjustable and she has been having to raise the head of the bed up or for him to build to breathe better over the last week.  Patient does endorse palpitations with exertion but not at rest.  He states that this is not new.  Patient also does endorse chest pain with coughing but states that his only time he feels this. Also endorses 1 month of dysuria and was previously taking Flomax but states that it made him feel dizzy so has not been taking it recently.  He also reports a fatigue over the last month and wife endorses about 10 pound weight loss over 6 months.  He has noted a lack of appetite as well over the last month.  He denies any abdominal pain  and states that his bowel movements have been regular with his last 1 being yesterday.  No blood that he has noted in his bowel movements.  Review of Systems: A complete ROS was negative except as per HPI.   ED Course: Sodium 125 WBC 15.2 Lactic Acid 2.1( before 500 mL bolus)  EKG:  ST and T wave abnormality noted II, III, avF. Appears to be in atrial flutter. Similar to EKG in November  Received 500 mL liter normal saline Ceftriaxone  1 g and azithromycin  500 X-ray showed 100 canis patchy and interstitial opacities within both lungs greatest in the midlung zone and left lung base.  Pulmonary edema versus multifocal pneumonia  Past Medical History: CAD status post CABG Sick sinus syndrome Hypercholesterolemia Essential hypertension Persistent atrial fibrillation status post cardioversion x2  Thrombocytopenia BPH  Meds:  Active Medications[1] Apixaban  5 mg twice daily Vitamin B12 1000 mcg Levothyroxine  125 mcg Metoprolol  succinate 25 mg 24-hour tablet Metoprolol  12.5 mg twice daily as needed( only takes once or twice a month) Rosuvastatin  10 mg Allergies: Allergies as of 10/31/2024 - Review Complete 10/31/2024  Allergen Reaction Noted   Latex  Itching 08/09/2014   Sulfa antibiotics Rash 11/18/2013    Past Surgical History:  Procedure Laterality Date   bilateral carotid duplex ultrasound showed no ICA     stenosis   CARDIAC CATHETERIZATION     Dr. Herminio which showed a 95% ostial LAD stenosis   CARDIOVERSION N/A 07/28/2024   Procedure: CARDIOVERSION;  Surgeon: Francyne Headland, MD;  Location: [facility] IN[facility]ASIVE CV [facility];  Service: Cardiovascular;  Laterality: N/A;   CARDIOVERSION N/A 09/30/2024   Procedure: CARDIOVERSION;  Surgeon: Francyne Headland, MD;  Location: [facility] INVASIVE CV [facility];  Service: Cardiovascular;  Laterality: N/A;   CORONARY ARTERY BYPASS GRAFT     x3 using a left internal mammary artery   PACEMAKER GENERATOR CHANGE N/A 08/09/2014   Procedure: PACEMAKER GENERATOR CHANGE  [place];  Surgeon: Headland Francyne, MD;  Location: [facility] CATH LAB;  Service: Cardiovascular;  Laterality: N/A;   Placement of permanent pacemaker     US  ECHOCARDIOGRAPHY     lft ventricular ejection fraction was about 60%    Social:  Lives With:Wife Joya) Occupation:Retired math teacher  Level of Function: PCP: Rudolpho Norleen BIRCH, MD  Substances: -T: none -A: [name] every once in a while  -D: none   Family History:  Family History  Problem Relation Age of Onset   Hypertension Mother    Stroke Mother    OBJECTIVE:   Physical Exam: Blood pressure (!) 161/74, pulse 98, temperature 98.5 F (36.9 C), temperature source Oral, resp. rate 20, height 5' 8 (1.727 m), weight 72.6 kg, SpO2 99%.  Constitutional: well-appearing male sitting in bed, in no acute distress HENT: normocephalic atraumatic, mucous membranes moist Cardiovascular: regular rate and rhythm, no m/r/g, no JVD Pulmonary/Chest: normal work of breathing on room air,  positive crackles in both lobes posteriorly to the mid lobe, 95% on 2L Abdominal: soft, non-tender, non-distended. Mild Suprapubic tenderness, Negative fluid wave Neurological: alert & oriented x 3 MSK: no gross abnormalities. No pitting edema bilaterally  Skin: warm and dry Psych: Normal mood and affect  Labs:    Latest Ref Rng & Units 10/31/2024    1:14 PM 09/14/2024   11:43 AM 07/01/2024   10:11 AM  CBC  WBC 4.0 - 10.5 K/uL 15.2  7.8  7.1   Hemoglobin 13.0 - 17.0 g/dL 86.1  86.1  84.7   Hematocrit 39.0 - 52.0 % 42.1  42.3  47.6   Platelets 150 - 400 K/uL 255  139  189         Latest Ref Rng & Units 10/31/2024    1:14 PM 10/13/2024   11:50 AM 09/14/2024   11:43 AM  CMP  Glucose 70 - 99 mg/dL 850  97  90   BUN 8 - 23 mg/dL 17  18  13    Creatinine 0.61 - 1.24 mg/dL 8.96  8.77  8.63   Sodium 135 - 145 mmol/L 125  141  137   Potassium 3.5 - 5.1 mmol/L 4.4  5.2  4.4   Chloride 98 - 111 mmol/L 93  101  103   CO2 22 - 32 mmol/L 19  21  28     Calcium  8.9 - 10.3 mg/dL 8.7  9.1  9.1       Imaging: DG Chest 2 View Result Date: 10/31/2024 CLINICAL DATA:  Shortness of breath.  Weakness. EXAM: CHEST - 2 VIEW COMPARISON:  Remote radiograph 01/13/2010 FINDINGS: Left-sided pacemaker in place. Post median sternotomy and CABG. The heart is mildly enlarged. Heterogeneous patchy and interstitial opacities within  both lungs, greatest in the mid lung zone and left lung base. There may be small pleural effusions. No pneumothorax. On limited assessment, no acute osseous findings. IMPRESSION: 1. Heterogeneous patchy and interstitial opacities within both lungs, greatest in the mid lung zone and left lung base. Findings may represent pulmonary edema or multifocal pneumonia. 2. Mild cardiomegaly.  Suspected small pleural effusions. Electronically Signed   By: Andrea Gasman M.D.   On: 10/31/2024 13:29      EKG: personally reviewed my interpretation is ST and T wave abnormality noted II, III, avF. Appears to be in atrial flutter. Similar to EKG in November  ASSESSMENT & PLAN:   Assessment & Plan by Problem: Principal Problem:   Acute hypoxic respiratory failure (HCC) Active Problems:   CAD s/p CABG 2007   SSS (sick sinus syndrome) [facility])   Pacemaker - Medtronic Enrhythm 2007   Essential hypertension   Acquired hypothyroidism   Benign prostatic hyperplasia with urinary hesitancy   Persistent atrial fibrillation (HCC)   Hyponatremia   DERRAN SEAR is a 89 year old male with a past medical history of history of CAD s/p CABG, sinus node dysfunction s/p PPM 2007, HTN, SVT, and paroxysmal atrial fibrillation that presents to hospital today due to shortness of breath that has been going on for the last 4-5 days and who is being admitted for acute hypoxic respiratory failure.   Acute Hypoxic Respiratory Failure CAP Leukocytosis  Patient is presenting with shortness of breath and cough for the last 3 days.  He he stated that the cough has been  productive with white sputum.  Does not use any home oxygen at all.  Although this has been worsening in the last few days he reports that he feels he has been having shortness of breath for the last month.  He is having some chest pain with cough but not otherwise.  Lower differential with his vitals for pulmonary embolism.  Differential is still acute onset heart failure.  Patient does endorse some orthopnea otherwise his volume exam leads us  to believe that he is euvolemic.  Still like to rule this out with BNP.  With chest x-ray showing multifocal pneumonia and leukocytosis and cough with productive sputum will treat for community acquired pneumonia.  Respiratory panel negative. Plan: -Continue Rocephin  1/5 days and azithromycin  1/3 days for cap -Ambulatory pulse ox - Wean oxygen as tolerated - Incentive spirometer and flutter valve - BNP 3,903. No previous baseline.   - echo ordered.  - strict Is &os and daily weights   - lasix  IV 20 mg  - Follow-up blood cultures - With EKG findings also like to rule out any ACS so will order troponin.  Lactic acidosis  2.1. Could likely be due to infection.  Have given 500 mL bolus to improve this and will continue to trend.   Hyponatremia Patients sodium was 125. Could be in the setting of decreased PO intake.  Mentation is intact and is asymptomatic.  Continue to monitor with BMP. If this does not improve with fluids and PO intake will likely need further workup. Also on the differential could be volume overload but does not appear to be on physical exam.  - encourage PO intake   Persistent Afib s/p Cardioversion on 09/2024 Patient has been cardioverted twice without success. Irregular rhythm on exam today, rate controlled.  Patient is taking metoprolol  succinate 24-hour 25 mg.  Patient does have a as needed metoprolol  tartrate but states that he rarely takes  it. - Will continue metoprolol  succinate 24 hours for 25 mg   Sick Sinus Syndrome  S/p  pacemaker  As noted per cardiology note on 12/12, patient is atrial paced rhythm up to 32% of the time. Ventricular pacing and atrial fibrillation seen most of the time.  - No acute concern inpatient.   BPH Dysuria vs LUTS Patient states that he has been having dysuria for the last month. He states that he was taking tamsulosin in the outpatient setting but it made him dizzy so he stopped this. With leukocytosis would like to rule out UTI - UA - bladder scan to look for retention   HTN No acute concerns at this time.  Management with metoprolol  25 mg 24 hour at this time.   HLD Continue crestor  10 mg   Hypothyroidism  Last TSH: 5.637 four months ago. Will recheck TSH as patient reports decreased appetite and weight loss of about 10 lbs in the last 6 months.  - Continue levothyroxine  125 mcg at breakfast  -Check TSH tomorrow morning     Diet: Normal VTE: NOAC Code: DNR/DNI  Prior to Admission Living Arrangement: Home, living Wife Anticipated Discharge Location: Home Barriers to Discharge: Acute hypoxic respiratory Failure   Dispo: Admit patient to Observation with expected length of stay less than 2 midnights.  Signed:   Melannie Metzner D'Mello, DO  Internal Medicine Resident PGY-1 10/31/2024, 5:30 PM   Please contact the on call pager at [phone]      [1]  Current Meds  Medication Sig   apixaban  (ELIQUIS ) 5 MG TABS tablet TAKE 1 TABLET BY MOUTH TWICE DAILY   benzonatate  (TESSALON ) 100 MG capsule Take 1-2 capsules (100-200 mg total) by mouth 3 (three) times daily as needed for cough.   cyanocobalamin  (VITAMIN B12) 1000 MCG tablet Take 1,000 mcg by mouth daily.   levothyroxine  (SYNTHROID ) 125 MCG tablet Take 125 mcg by mouth daily before breakfast.   metoprolol  succinate (TOPROL -XL) 25 MG 24 hr tablet Take 1 tablet (25 mg total) by mouth daily.   rosuvastatin  (CRESTOR ) 10 MG tablet TAKE 1 TABLET BY MOUTH DAILY   "

## 2024-10-31 NOTE — ED Triage Notes (Signed)
 Pt bib ems from home c/o sob requiring oxygen. Pt states cardiologist felt his low oxygen was d/t afib. Pt was placed on amiodarone . The first dose was ineffective, so dose was doubled. Pt states after adjustment he has gotten worse. Pt O2 82% with exertion, 92% sitting, and 98% on 2L. No respiratory hx. Pt states he was sick recently and felt weak as well.   BP 170/90 HR 90-100 RR 16 2L O2 98%  500 cc NS   18g LAC

## 2024-10-31 NOTE — ED Provider Notes (Signed)
 " [place] EMERGENCY DEPARTMENT AT [facility] Provider Note   CSN: [id number] Arrival date & time: 10/31/24  1228     Patient presents with: Shortness of Breath and Weakness   [name] is a 89 y.o. male.   HPI 89 year old male presents with generalized weakness and shortness of breath.  The patient has been having a cough for about a week as well as shortness of breath with exertion.  Has been generally fatigued, generally weak and with poor appetite.  Son noticed today that he had low O2 sats after coming back from walking to the bathroom.  Sats were in the 80s.  He does not normally wear oxygen.  Prior to Admission medications  Medication Sig Start Date End Date Taking? Authorizing Provider  apixaban  (ELIQUIS ) 5 MG TABS tablet TAKE 1 TABLET BY MOUTH TWICE DAILY 09/17/24   Croitoru, Jerel, MD  benzonatate  (TESSALON ) 100 MG capsule Take 1-2 capsules (100-200 mg total) by mouth 3 (three) times daily as needed for cough. 10/29/24   Croitoru, Mihai, MD  cyanocobalamin  (VITAMIN B12) 1000 MCG tablet Take 1,000 mcg by mouth daily. 04/23/22   [provider]  levothyroxine  (SYNTHROID ) 125 MCG tablet Take 125 mcg by mouth daily before breakfast. 09/21/15   [provider]  metoprolol  succinate (TOPROL -XL) 25 MG 24 hr tablet Take 1 tablet (25 mg total) by mouth daily. 08/19/24   Croitoru, Mihai, MD  metoprolol  tartrate (LOPRESSOR ) 25 MG tablet Take 0.5 tablets (12.5 mg total) by mouth 2 (two) times daily as needed (take as needed for palpitations). 05/10/24 05/10/25  Terra Fairy PARAS, PA-C  rosuvastatin  (CRESTOR ) 10 MG tablet TAKE 1 TABLET BY MOUTH DAILY 10/21/24   Croitoru, Mihai, MD    Allergies: Latex and Sulfa antibiotics    Review of Systems  Constitutional:  Positive for fatigue and unexpected weight change. Negative for fever.  Respiratory:  Positive for cough and shortness of breath.   Cardiovascular:  Negative for chest pain and leg swelling.   Gastrointestinal:  Negative for vomiting.    Updated Vital Signs BP (!) 161/74 (BP Location: Right Arm)   Pulse 98   Temp 98.5 F (36.9 C) (Oral)   Resp 20   Ht 5' 8 (1.727 m)   Wt 72.6 kg   SpO2 99% Comment: 2L [place]  BMI 24.33 kg/m   Physical Exam Vitals and nursing note reviewed.  Constitutional:      General: He is not in acute distress.    Appearance: He is well-developed. He is not ill-appearing or diaphoretic.  HENT:     Head: Normocephalic and atraumatic.  Cardiovascular:     Rate and Rhythm: Normal rate. Rhythm irregular.     Heart sounds: Normal heart sounds.  Pulmonary:     Effort: Pulmonary effort is normal.     Breath sounds: Rales present.  Abdominal:     Palpations: Abdomen is soft.     Tenderness: There is no abdominal tenderness.  Musculoskeletal:     Right lower leg: No edema.     Left lower leg: No edema.  Skin:    General: Skin is warm and dry.  Neurological:     Mental Status: He is alert.     (all labs ordered are listed, but only abnormal results are displayed) Labs Reviewed  BASIC METABOLIC PANEL WITH GFR - Abnormal; Notable for the following components:      Result Value   Sodium 125 (*)    Chloride  93 (*)    CO2 19 (*)    Glucose, Bld 149 (*)    Calcium  8.7 (*)    All other components within normal limits  CBC - Abnormal; Notable for the following components:   WBC 15.2 (*)    All other components within normal limits  PROTIME-INR - Abnormal; Notable for the following components:   Prothrombin Time 20.4 (*)    INR 1.7 (*)    All other components within normal limits  RESP PANEL BY RT-PCR (RSV, FLU A&B, COVID)  RVPGX2  CULTURE, BLOOD (ROUTINE X 2)  CULTURE, BLOOD (ROUTINE X 2)  I-STAT CG4 LACTIC ACID, ED    EKG: EKG Interpretation Date/Time:  Sunday October 31 2024 12:48:53 EST Ventricular Rate:  108 PR Interval:    QRS Duration:  96 QT Interval:  366 QTC Calculation: 490 R Axis:   100  Text Interpretation: Atrial  flutter with variable A-V block Rightward axis ST & T wave abnormality, consider inferior ischemia ST & T wave abnormality, consider anterolateral ischemia Confirmed by Freddi Hamilton [phone]) on 10/31/2024 2:27:51 PM  Radiology: DG Chest 2 View Result Date: 10/31/2024 CLINICAL DATA:  Shortness of breath.  Weakness. EXAM: CHEST - 2 VIEW COMPARISON:  Remote radiograph 01/13/2010 FINDINGS: Left-sided pacemaker in place. Post median sternotomy and CABG. The heart is mildly enlarged. Heterogeneous patchy and interstitial opacities within both lungs, greatest in the mid lung zone and left lung base. There may be small pleural effusions. No pneumothorax. On limited assessment, no acute osseous findings. IMPRESSION: 1. Heterogeneous patchy and interstitial opacities within both lungs, greatest in the mid lung zone and left lung base. Findings may represent pulmonary edema or multifocal pneumonia. 2. Mild cardiomegaly.  Suspected small pleural effusions. Electronically Signed   By: Andrea Gasman M.D.   On: 10/31/2024 13:29     Procedures   Medications Ordered in the ED  sodium chloride  0.9 % bolus 500 mL (has no administration in time range)  cefTRIAXone  (ROCEPHIN ) 1 g in sodium chloride  0.9 % 100 mL IVPB (has no administration in time range)  azithromycin  (ZITHROMAX ) 500 mg in sodium chloride  0.9 % 250 mL IVPB (has no administration in time range)                                    Medical Decision Making Amount and/or Complexity of Data Reviewed Labs: ordered.    Details: Leukocytosis Radiology: ordered and independent interpretation performed.    Details: Pneumonia ECG/medicine tests: ordered and independent interpretation performed.    Details: Irregular rhythm  Risk Decision regarding hospitalization.   Patient appears to have pneumonia.  He also has hyponatremia and an elevated WBC.  Lactate is barely elevated at 2.1.  Started on broad antibiotics and small fluid bolus given.   Discussed with the internal medicine teaching service, who will admit for respiratory failure with pneumonia.  No respiratory distress or need for BiPAP/intubation at this time.     Final diagnoses:  Acute respiratory failure with hypoxia [facility])  Multifocal pneumonia    ED Discharge Orders     None          Freddi Hamilton, MD 10/31/24 2030  "

## 2024-11-01 ENCOUNTER — Observation Stay (HOSPITAL_COMMUNITY)

## 2024-11-01 ENCOUNTER — Inpatient Hospital Stay (HOSPITAL_COMMUNITY)

## 2024-11-01 DIAGNOSIS — Z9981 Dependence on supplemental oxygen: Secondary | ICD-10-CM

## 2024-11-01 DIAGNOSIS — Z79890 Hormone replacement therapy (postmenopausal): Secondary | ICD-10-CM | POA: Diagnosis not present

## 2024-11-01 DIAGNOSIS — I10 Essential (primary) hypertension: Secondary | ICD-10-CM | POA: Diagnosis not present

## 2024-11-01 DIAGNOSIS — Z950 Presence of cardiac pacemaker: Secondary | ICD-10-CM

## 2024-11-01 DIAGNOSIS — E089 Diabetes mellitus due to underlying condition without complications: Secondary | ICD-10-CM

## 2024-11-01 DIAGNOSIS — E785 Hyperlipidemia, unspecified: Secondary | ICD-10-CM

## 2024-11-01 DIAGNOSIS — Z79899 Other long term (current) drug therapy: Secondary | ICD-10-CM | POA: Diagnosis not present

## 2024-11-01 DIAGNOSIS — R300 Dysuria: Secondary | ICD-10-CM

## 2024-11-01 DIAGNOSIS — J9601 Acute respiratory failure with hypoxia: Secondary | ICD-10-CM

## 2024-11-01 DIAGNOSIS — I4891 Unspecified atrial fibrillation: Secondary | ICD-10-CM | POA: Diagnosis not present

## 2024-11-01 DIAGNOSIS — I480 Paroxysmal atrial fibrillation: Secondary | ICD-10-CM | POA: Diagnosis not present

## 2024-11-01 DIAGNOSIS — N400 Enlarged prostate without lower urinary tract symptoms: Secondary | ICD-10-CM

## 2024-11-01 DIAGNOSIS — Z792 Long term (current) use of antibiotics: Secondary | ICD-10-CM | POA: Diagnosis not present

## 2024-11-01 LAB — BASIC METABOLIC PANEL WITH GFR
Anion gap: 9 (ref 5–15)
BUN: 20 mg/dL (ref 8–23)
CO2: 25 mmol/L (ref 22–32)
Calcium: 8.3 mg/dL — ABNORMAL LOW (ref 8.9–10.3)
Chloride: 96 mmol/L — ABNORMAL LOW (ref 98–111)
Creatinine, Ser: 1.16 mg/dL (ref 0.61–1.24)
GFR, Estimated: >60 mL/min
Glucose, Bld: 115 mg/dL — ABNORMAL HIGH (ref 70–99)
Potassium: 4.1 mmol/L (ref 3.5–5.1)
Sodium: 130 mmol/L — ABNORMAL LOW (ref 135–145)

## 2024-11-01 LAB — MAGNESIUM: Magnesium: 1.9 mg/dL (ref 1.7–2.4)

## 2024-11-01 LAB — TSH: TSH: 3.09 u[IU]/mL (ref 0.350–4.500)

## 2024-11-01 LAB — ECHOCARDIOGRAM COMPLETE
Height: 68 in
S' Lateral: 2.7 cm
Weight: 2560 [oz_av]

## 2024-11-01 LAB — CBC
HCT: 38.7 % — ABNORMAL LOW (ref 39.0–52.0)
Hemoglobin: 12.7 g/dL — ABNORMAL LOW (ref 13.0–17.0)
MCH: 28.1 pg (ref 26.0–34.0)
MCHC: 32.8 g/dL (ref 30.0–36.0)
MCV: 85.6 fL (ref 80.0–100.0)
Platelets: 221 K/uL (ref 150–400)
RBC: 4.52 MIL/uL (ref 4.22–5.81)
RDW: 14.6 % (ref 11.5–15.5)
WBC: 11.8 K/uL — ABNORMAL HIGH (ref 4.0–10.5)
nRBC: 0 % (ref 0.0–0.2)

## 2024-11-01 LAB — PROCALCITONIN: Procalcitonin: 0.31 ng/mL

## 2024-11-01 MED ORDER — SODIUM CHLORIDE 0.9 % IV SOLN
1.0000 g | INTRAVENOUS | Status: DC
  Administered 2024-11-02 – 2024-11-03 (×2): 1 g via INTRAVENOUS
  Filled 2024-11-01 (×3): qty 10

## 2024-11-01 MED ORDER — AZITHROMYCIN 500 MG PO TABS
500.0000 mg | ORAL_TABLET | Freq: Every day | ORAL | Status: AC
  Administered 2024-11-01 – 2024-11-02 (×2): 500 mg via ORAL
  Filled 2024-11-01 (×2): qty 1

## 2024-11-01 MED ORDER — SODIUM CHLORIDE 0.9 % IV SOLN: 1.0000 g | INTRAVENOUS | Status: DC

## 2024-11-01 MED ORDER — FUROSEMIDE 10 MG/ML IJ SOLN
40.0000 mg | Freq: Once | INTRAMUSCULAR | Status: AC
  Administered 2024-11-01: 40 mg via INTRAVENOUS
  Filled 2024-11-01: qty 4

## 2024-11-01 MED ORDER — AZITHROMYCIN 250 MG PO TABS: 500.0000 mg | ORAL_TABLET | Freq: Every day | ORAL | Status: DC

## 2024-11-01 MED ORDER — SODIUM CHLORIDE 0.9 % IV SOLN
1.0000 g | INTRAVENOUS | Status: DC
  Administered 2024-11-01: 1 g via INTRAVENOUS
  Filled 2024-11-01: qty 10

## 2024-11-01 MED ORDER — MAGNESIUM SULFATE 2 GM/50ML IV SOLN
2.0000 g | Freq: Once | INTRAVENOUS | Status: AC
  Administered 2024-11-01: 2 g via INTRAVENOUS
  Filled 2024-11-01: qty 50

## 2024-11-01 NOTE — ED Notes (Signed)
 Increased O2 to 4L [place] due to SpO2 88%.

## 2024-11-01 NOTE — TOC Initial Note (Signed)
 Transition of Care [facility]) - Initial/Assessment Note    Patient Details  Name: [name] MRN: [id number] Date of Birth: 1933/08/28  Transition of Care [facility]) CM/SW Contact:    Ian JONELLE [name], RN Phone Number: 11/01/2024, 4:00 PM  Clinical Narrative:                 CM met with the patient and wife at the bedside to discuss patient's needs for home.  The patient admitted to the hospital with simple pleurisy.  Patient lives with his spouse and remains on oxygen in the hospital.  Patient is normally on RA at home prior to admission.  Patient has DME at the home that includes shower chair, RW, transport chair, adjustable hospital bed and bathroom scale.  Patient was offered Medicaid choice regarding home health services and patient's wife did not have a preference.  Patient was faxed out in the hub.  [facility] accepted the patient for  [facility] Pt, OT services.  Patient's wife states that she is hoping that patient can return home without need for home oxygen.  CM will continue to follow the patient for IP Care management needs when stable to return home.        Patient Goals and CMS Choice            Expected Discharge Plan and Services                                              Prior Living Arrangements/Services                       Activities of Daily Living   ADL Screening (condition at time of admission) Independently performs ADLs?: Yes (appropriate for developmental age) Is the patient deaf or have difficulty hearing?: No Does the patient have difficulty seeing, even when wearing glasses/contacts?: No Does the patient have difficulty concentrating, remembering, or making decisions?: No  Permission Sought/Granted                  Emotional Assessment              Admission diagnosis:  Acute respiratory failure with hypoxia (HCC) [J96.01] Multifocal pneumonia [J18.8] Acute hypoxic respiratory failure (HCC) [J96.01] Patient  Active Problem List   Diagnosis Date Noted   Acute hypoxic respiratory failure (HCC) 10/31/2024   Hyponatremia 10/31/2024   Persistent atrial fibrillation (HCC) 07/28/2024   Pacemaker reprogramming/check 07/28/2024   Tachycardia-bradycardia syndrome (HCC) 07/28/2024   Goiter diffuse 08/20/2023   Benign prostatic hyperplasia with urinary hesitancy 07/23/2023   Acquired thrombophilia 07/22/2022   Acquired hypothyroidism 07/22/2022   Elevated PSA 10/23/2018   Coronary artery disease involving coronary bypass graft of native heart without angina pectoris 07/16/2018   Lumbar radiculopathy 04/14/2018   Radiculopathy of lumbosacral region 02/25/2018   Right hip pain 02/25/2018   Long term current use of anticoagulant 06/12/2017   Thrombocytopenia 05/23/2015   Paroxysmal atrial fibrillation (HCC) 11/22/2014   CAD s/p CABG 2007 11/19/2013   SSS (sick sinus syndrome) (HCC) 11/19/2013   Pacemaker - Medtronic Enrhythm 2007 11/19/2013   Hypercholesterolemia 11/19/2013   Essential hypertension 11/19/2013   SVT (supraventricular tachycardia) 11/19/2013   PCP:  Ian Norleen BIRCH, MD Pharmacy:   [facility] - [place], [place] - [address] [place] [address] Phone:  [phone] Fax: [phone]     Social Drivers of Health (SDOH) Social History: SDOH Screenings   Food Insecurity: No Food Insecurity (11/01/2024)  Housing: Low Risk (11/01/2024)  Transportation Needs: No Transportation Needs (11/01/2024)  Utilities: Not At Risk (11/01/2024)  Financial Resource Strain: Low Risk  (06/24/2024)   Received from [facility]  Social Connections: Moderately Isolated (11/01/2024)  Tobacco Use: Low Risk (10/31/2024)   SDOH Interventions:     Readmission Risk Interventions    11/01/2024    3:58 PM  Readmission Risk Prevention Plan  Post Dischage Appt Complete  Medication Screening Complete  Transportation Screening Complete

## 2024-11-01 NOTE — Progress Notes (Addendum)
 "  HD#0 SUBJECTIVE:  Patient Summary: [name] is a 89 year old male with a past medical history of history of CAD s/p CABG, sinus node dysfunction s/p PPM 2007, HTN, SVT, and paroxysmal atrial fibrillation that presents to hospital today due to shortness of breath that has been going on for the last 4-5 days and who is being admitted for acute hypoxic respiratory failure.   Overnight Events: no overnight events  Interim History: Patient states that his breathing is better today.  His daughter stated that he was breathing a lot better last night.  He did state that he got up to urinate quite a bit.  OBJECTIVE:  Vital Signs: Vitals:   11/01/24 0400 11/01/24 0500 11/01/24 0528 11/01/24 0822  BP: (!) 136/94 138/72  127/68  Pulse: 83 86 93 87  Resp: (!) 25 (!) 30 (!) 27 (!) 26  Temp:   99.3 F (37.4 C) 97.9 F (36.6 C)  TempSrc:   Oral Axillary  SpO2: 95% 92% 92% (!) 89%  Weight:      Height:       Supplemental O2: Nasal Cannula SpO2: (!) 89 % O2 Flow Rate (L/min): 4 L/min  Filed Weights   10/31/24 1240  Weight: 72.6 kg     Intake/Output Summary (Last 24 hours) at 11/01/2024 9043 Last data filed at 11/01/2024 0143 Gross per 24 hour  Intake --  Output 1000 ml  Net -1000 ml   Net IO Since Admission: -1,000 mL [11/01/24 0956]  Physical Exam: Physical Exam Constitutional: well-appearing male sitting in bed, in no acute distress HENT: normocephalic atraumatic, mucous membranes moist Cardiovascular: regular rate and rhythm, no murmur, no JVD up to mid neck Pulmonary/Chest: Tachypnea, bibasilar crackles noted, 92% on 4L Abdominal: soft, non-tender, non-distended Negative fluid wave Neurological: alert & oriented x 3 MSK: no gross abnormalities. No pitting edema bilaterally  Skin: warm and dry Psych: Normal mood and affect  Patient Lines/Drains/Airways Status     Active Line/Drains/Airways     Name Placement date Placement time Site Days   Peripheral IV 10/31/24 20  G Left Antecubital 10/31/24  1456  Antecubital  1   External Urinary Catheter 10/31/24  2118  --  1            Pertinent labs and imaging:      Latest Ref Rng & Units 11/01/2024    1:29 AM 10/31/2024    1:14 PM 09/14/2024   11:43 AM  CBC  WBC 4.0 - 10.5 K/uL 11.8  15.2  7.8   Hemoglobin 13.0 - 17.0 g/dL 87.2  86.1  86.1   Hematocrit 39.0 - 52.0 % 38.7  42.1  42.3   Platelets 150 - 400 K/uL 221  255  139        Latest Ref Rng & Units 11/01/2024    1:29 AM 10/31/2024    4:13 PM 10/31/2024    1:14 PM  CMP  Glucose 70 - 99 mg/dL 884   850   BUN 8 - 23 mg/dL 20   17   Creatinine 9.38 - 1.24 mg/dL 8.83   8.96   Sodium 864 - 145 mmol/L 130   125   Potassium 3.5 - 5.1 mmol/L 4.1   4.4   Chloride 98 - 111 mmol/L 96   93   CO2 22 - 32 mmol/L 25   19   Calcium  8.9 - 10.3 mg/dL 8.3   8.7   Total Protein 6.5 - 8.1 g/dL  6.0    Total Bilirubin 0.0 - 1.2 mg/dL  0.9    Alkaline Phos 38 - 126 U/L  83    AST 15 - 41 U/L  36    ALT 0 - 44 U/L  21      DG Chest 2 View Result Date: 10/31/2024 CLINICAL DATA:  Shortness of breath.  Weakness. EXAM: CHEST - 2 VIEW COMPARISON:  Remote radiograph 01/13/2010 FINDINGS: Left-sided pacemaker in place. Post median sternotomy and CABG. The heart is mildly enlarged. Heterogeneous patchy and interstitial opacities within both lungs, greatest in the mid lung zone and left lung base. There may be small pleural effusions. No pneumothorax. On limited assessment, no acute osseous findings. IMPRESSION: 1. Heterogeneous patchy and interstitial opacities within both lungs, greatest in the mid lung zone and left lung base. Findings may represent pulmonary edema or multifocal pneumonia. 2. Mild cardiomegaly.  Suspected small pleural effusions. Electronically Signed   By: Andrea Gasman M.D.   On: 10/31/2024 13:29    ASSESSMENT/PLAN:  Assessment: Principal Problem:   Acute hypoxic respiratory failure (HCC) Active Problems:   CAD s/p CABG 2007   SSS (sick  sinus syndrome) [facility])   Pacemaker - Medtronic Enrhythm 2007   Essential hypertension   Acquired hypothyroidism   Benign prostatic hyperplasia with urinary hesitancy   Persistent atrial fibrillation (HCC)   Hyponatremia  [name] is a 89 year old male with a past medical history of history of CAD s/p CABG, sinus node dysfunction s/p PPM 2007, HTN, SVT, and paroxysmal atrial fibrillation that presents to hospital today due to shortness of breath that has been going on for the last 4-5 days and who is being admitted for acute hypoxic respiratory failure.   Plan: # Acute hypoxic respiratory failure Although patient's wife stated that his symptoms been going on for the last 3 days he stated that he has been having shortness of breath for the last month.  He was on amiodarone  for about 50 days previously.  Yesterday after BNP came back elevated at almost 4000 we decided to give IV Lasix  20 mg.  With patient's symptomatic improvement, do think that this is more likely in setting of volume overload.  Echo pending.  Did order a procalcitonin and came back at 0.31.  On 4 L of oxygen -Will discontinue Rocephin  and azithromycin  at this time. -Ambulatory pulse ox - wean oxygen as tolerated - Incentive spirometer and flutter valve - Strict I's and O's and daily weights  -Will give IV Lasix  40 mg -With patient's new oxygen requirement if not improving with diuresis, consider CT chest without contrast   Hyponatremia Improved today to 130.  in the setting of decreased p.o. intake but also could have been due to volume overload. - Continue to monitor with BMP - Will also order urine Legionella antigen  Persistent Afib s/p Cardioversion on 09/2024  Rate controlled - Will continue metoprolol  succinate 24 hours for 25 mg    Sick Sinus Syndrome  S/p pacemaker  As noted per cardiology note on 12/12, patient is atrial paced rhythm up to 32% of the time. Ventricular pacing and atrial fibrillation seen  most of the time.  - No acute concern inpatient.    BPH Dysuria vs LUTS Bladder scan did not show any signs of infection.  UA showed small bilirubin, 15 ketones and protein.  Acute concerns for infection at this time.    HTN No acute concerns at this time.  Management with metoprolol  25  mg 24 hour at this time.    HLD Continue crestor  10 mg    Hypothyroidism  TSH 3.090.  Within normal limits - Continue levothyroxine  125 mcg at breakfast    Best Practice: Diet: Regular diet IVF: none VTE: Apixaban  5 mg BID Code: DNR/DNI  Disposition planning: Therapy Recs: Pending Family Contact: daughter and wife, at bedside. DISPO: pending   Signature:  Krystn Dermody [facility]  9:56 AM, 11/01/2024  On Call pager [phone]  "

## 2024-11-01 NOTE — Evaluation (Signed)
 Occupational Therapy Evaluation Patient Details Name: [name] MRN: [id number] DOB: October 02, 1933 Today's Date: 11/01/2024   History of Present Illness   Pt is an 89 year old man who presented to [facility] on 10/[facility]/24 with acute hypoxic respiratory failure, cough, generalized weakness, fatigue and recent weight loss. Respiratory likely due to volume overload. PMH: CAD s/p CABG, SSS s/p PCM, HTN, SVT, BPH, hypothyroidism.     Clinical Impressions Limited evaluation due to fatigue. Pt had just transferred from ED and had not slept well last night. Pt is typically independent in mobility and ADLs, assisted by his supportive wife for IADLs. Pt is currently generally weak with decreased activity tolerance and dependence on 4L O2. He demonstrates baseline UE tremor which has recently worsened. Set up 3 in 1 for use bedside as pt had significant drop in O2 saturation during earlier PT visit. Will follow acutely for ADL training and education in energy conservation strategies. Recommend HHOT upon discharge.      If plan is discharge home, recommend the following:   A little help with bathing/dressing/bathroom;A little help with walking and/or transfers;Assistance with cooking/housework;Direct supervision/assist for medications management;Direct supervision/assist for financial management;Assist for transportation;Help with stairs or ramp for entrance     Functional Status Assessment   Patient has had a recent decline in their functional status and demonstrates the ability to make significant improvements in function in a reasonable and predictable amount of time.     Equipment Recommendations   None recommended by OT     Recommendations for Other Services         Precautions/Restrictions   Precautions Precautions: Fall;Other (comment) Precaution/Restrictions Comments: watch O2 Restrictions Weight Bearing Restrictions Per Provider Order: No     Mobility Bed Mobility Overal bed  mobility: Needs Assistance Bed Mobility: Supine to Sit, Sit to Supine     Supine to sit: Min assist Sit to supine: Min assist        Transfers                          Balance                                           ADL either performed or assessed with clinical judgement   ADL Overall ADL's : Needs assistance/impaired Eating/Feeding: Independent Eating/Feeding Details (indicate cue type and reason): pt has weighted utensils at home which wife reports is helpful Grooming: Set up;Sitting   Upper Body Bathing: Set up;Sitting       Upper Body Dressing : Set up;Sitting                           Vision Baseline Vision/History: 1 Wears glasses Patient Visual Report: No change from baseline       Perception         Praxis         Pertinent Vitals/Pain Pain Assessment Pain Assessment: No/denies pain     Extremity/Trunk Assessment Upper Extremity Assessment Upper Extremity Assessment: Right hand dominant;Generalized weakness;RUE deficits/detail;LUE deficits/detail RUE Deficits / Details: tremor RUE Coordination: decreased fine motor LUE Deficits / Details: tremor LUE Coordination: decreased fine motor   Lower Extremity Assessment Lower Extremity Assessment: Defer to PT evaluation       Communication Communication Communication: Impaired Factors Affecting Communication: Hearing impaired  Cognition Arousal: Alert Behavior During Therapy: WFL for tasks assessed/performed Cognition: Difficult to assess (HOH) Difficult to assess due to: Hard of hearing/deaf           OT - Cognition Comments: appears intact                 Following commands: Intact       Cueing  General Comments   Cueing Techniques: Verbal cues  SpO2 95% on 4L at rest, 93% on 2L at rest, 82% on RA with bed mobility, and 87% on 4L with amb   Exercises     Shoulder Instructions      Home Living Family/patient expects to be  discharged to:: Private residence Living Arrangements: Spouse/significant other Available Help at Discharge: Family;Available 24 hours/day Type of Home: House Home Access: Stairs to enter [employer] of Steps: 4 Entrance Stairs-Rails: Right;Left;Can reach both Home Layout: Multi-level;Able to live on main level with bedroom/bathroom     Bathroom Shower/Tub: [employer] Toilet: Handicapped height     Home Equipment: Shower seat - built [employer]raiser (2 wheels);Transport chair          Prior Functioning/Environment Prior Level of Function : Independent/Modified Independent             Mobility Comments: No assistive device ADLs Comments: wife assists with managing appointments/meds    OT Problem List: Decreased strength;Decreased activity tolerance;Cardiopulmonary status limiting activity   OT Treatment/Interventions:        OT Goals(Current goals can be found in the care plan section)   Acute Rehab OT Goals OT Goal Formulation: With patient/family Time For Goal Achievement: 11/15/24 Potential to Achieve Goals: Good ADL Goals Pt Will Perform Grooming: with supervision;standing Pt Will Perform Upper Body Dressing: with set-up;sitting Pt Will Perform Lower Body Dressing: with supervision;sit to/from stand Pt Will Transfer to Toilet: with supervision;ambulating;bedside commode (BSC over toilet) Pt Will Perform Toileting - Clothing Manipulation and hygiene: with supervision;sit to/from stand Additional ADL Goal #1: Pt will generalize energy conservation strategies and pursed lip breathing techniques in ADLs and mobility.   OT Frequency:  Min 2X/week    Co-evaluation              AM-PAC OT 6 Clicks Daily Activity     Outcome Measure Help from another person eating meals?: None Help from another person taking care of personal grooming?: A Little Help from another person toileting, which includes using toliet, bedpan, or urinal?:  A Little Help from another person bathing (including washing, rinsing, drying)?: A Little Help from another person to put on and taking off regular upper body clothing?: A Little Help from another person to put on and taking off regular lower body clothing?: A Little 6 Click Score: 19   End of Session Equipment Utilized During Treatment: Oxygen  Activity Tolerance: Patient limited by fatigue Patient left: in bed;with call bell/phone within reach  OT Visit Diagnosis: Unsteadiness on feet (R26.81);Muscle weakness (generalized) (M62.81);Other (comment) (decreased activity tolerance)                Time: 8555-8547 OT Time Calculation (min): 8 min Charges:  OT General Charges $OT Visit: 1 Visit [name], OTR/L Acute Rehabilitation Services Office: [phone]   [name] 11/01/2024, 4:02 PM

## 2024-11-01 NOTE — Progress Notes (Signed)
 Echocardiogram 2D Echocardiogram has been performed.  [name] 11/01/2024, 10:55 AM

## 2024-11-01 NOTE — Evaluation (Signed)
 Physical Therapy Evaluation Patient Details Name: [name] MRN: [id number] DOB: November 29, 1932 Today's Date: 11/01/2024  History of Present Illness  Pt is an 89 year old man who presented to [facility] on 10/31/24 with acute hypoxic respiratory failure, cough, generalized weakness, fatigue and recent weight loss. Respiratory likely due to volume overload. PMH: CAD s/p CABG, SSS s/p PCM, HTN, SVT, BPH, hypothyroidism.  Clinical Impression  Pt admitted with above diagnosis and presents to PT with functional limitations due to deficits listed below (See PT problem list). Pt needs skilled PT to maximize independence and safety. Pt lives with wife and is typically independent. Limited to very short distance amb today due to dyspnea and fatigue. Hopefully will progress fairly quickly and be able to return home with wife. If not will need to reassess plan.           If plan is discharge home, recommend the following: A lot of help with walking and/or transfers;A lot of help with bathing/dressing/bathroom;Assist for transportation;Help with stairs or ramp for entrance;Assistance with cooking/housework   Can travel by private vehicle        Equipment Recommendations None recommended by PT  Recommendations for Other Services       Functional Status Assessment Patient has had a recent decline in their functional status and demonstrates the ability to make significant improvements in function in a reasonable and predictable amount of time.     Precautions / Restrictions Precautions Precautions: Fall;Other (comment) Precaution/Restrictions Comments: watch O2 Restrictions Weight Bearing Restrictions Per Provider Order: No      Mobility  Bed Mobility Overal bed mobility: Needs Assistance Bed Mobility: Supine to Sit, Sit to Supine     Supine to sit: Min assist Sit to supine: Min assist   General bed mobility comments: Assist to elevate trunk into sitting and bring hips to EOB. Assist to bring  legs back up into bed.    Transfers Overall transfer level: Needs assistance Equipment used: Rolling walker (2 wheels) Transfers: Sit to/from Stand Sit to Stand: Min assist           General transfer comment: Assist to power up and for balance    Ambulation/Gait Ambulation/Gait assistance: Min assist Gait Distance (Feet): 10 Feet Assistive device: Rolling walker (2 wheels) Gait Pattern/deviations: Step-to pattern, Decreased step length - right, Decreased step length - left, Shuffle, Trunk flexed Gait velocity: decr Gait velocity interpretation: <1.31 ft/sec, indicative of household ambulator   General Gait Details: Assist for balance and support  Stairs            Wheelchair Mobility     Tilt Bed    Modified Rankin (Stroke Patients Only)       Balance Overall balance assessment: Needs assistance Sitting-balance support: Bilateral upper extremity supported, Feet supported Sitting balance-Leahy Scale: Poor Sitting balance - Comments: min assist for static Postural control: Posterior lean Standing balance support: Bilateral upper extremity supported, During functional activity, Reliant on assistive device for balance Standing balance-Leahy Scale: Poor Standing balance comment: walker and min assist                             Pertinent Vitals/Pain Pain Assessment Pain Assessment: No/denies pain    Home Living Family/patient expects to be discharged to:: Private residence Living Arrangements: Spouse/significant other Available Help at Discharge: Family;Available 24 hours/day Type of Home: House Home Access: Stairs to enter Entrance Stairs-Rails: Right;Left;Can reach both Entrance Stairs-Number of Steps: 4  Home Layout: Multi-level;Able to live on main level with bedroom/bathroom Home Equipment: Shower seat - built [employer]raiser (2 wheels);Transport chair      Prior Function Prior Level of Function : Independent/Modified Independent              Mobility Comments: No assistive device       Extremity/Trunk Assessment   Upper Extremity Assessment Upper Extremity Assessment: Defer to OT evaluation    Lower Extremity Assessment Lower Extremity Assessment: Generalized weakness       Communication   Communication Communication: Impaired Factors Affecting Communication: Hearing impaired    Cognition Arousal: Alert Behavior During Therapy: WFL for tasks assessed/performed   PT - Cognitive impairments: No apparent impairments                         Following commands: Intact       Cueing Cueing Techniques: Verbal cues     General Comments General comments (skin integrity, edema, etc.): SpO2 95% on 4L at rest, 93% on 2L at rest, 82% on RA with bed mobility, and 87% on 4L with amb    Exercises     Assessment/Plan    PT Assessment Patient needs continued PT services  PT Problem List Decreased strength;Decreased activity tolerance;Decreased balance;Decreased mobility;Cardiopulmonary status limiting activity       PT Treatment Interventions DME instruction;Gait training;Stair training;Functional mobility training;Therapeutic activities;Therapeutic exercise;Balance training;Patient/family education    PT Goals (Current goals can be found in the Care Plan section)  Acute Rehab PT Goals Patient Stated Goal: return home PT Goal Formulation: With patient Time For Goal Achievement: 11/15/24 Potential to Achieve Goals: Good    Frequency Min 2X/week     Co-evaluation               AM-PAC PT 6 Clicks Mobility  Outcome Measure Help needed turning from your back to your side while in a flat bed without using bedrails?: A Little Help needed moving from lying on your back to sitting on the side of a flat bed without using bedrails?: A Little Help needed moving to and from a bed to a chair (including a wheelchair)?: A Little Help needed standing up from a chair using your arms (e.g.,  wheelchair or bedside chair)?: A Little Help needed to walk in hospital room?: Total Help needed climbing 3-5 steps with a railing? : Total 6 Click Score: 14    End of Session Equipment Utilized During Treatment: Gait belt;Oxygen Activity Tolerance: Patient limited by fatigue Patient left: in bed;with call bell/phone within reach;with family/visitor present   PT Visit Diagnosis: Unsteadiness on feet (R26.81);Other abnormalities of gait and mobility (R26.89);Muscle weakness (generalized) (M62.81)    Time: 1132-1200 PT Time Calculation (min) (ACUTE ONLY): 28 min   Charges:   PT Evaluation $PT Eval Moderate Complexity: 1 Mod PT Treatments $Gait Training: 8-22 mins PT General Charges $$ ACUTE PT VISIT: 1 Visit         [facility] PT Acute Rehabilitation Services Office [phone]   Rodgers ORN [facility] 11/01/2024, 3:00 PM

## 2024-11-01 NOTE — Hospital Course (Addendum)
#   Acute hypoxic respiratory failure Although patient's wife stated that his symptoms been going on for the last 3 days he stated that he has been having shortness of breath for the last month. He was on amiodarone  for about 50 days previously. 12/21 after BNP came back elevated at almost 4000 we decided to give IV Lasix  20 mg. With patient's symptomatic improvement, do think that this is more likely in setting of volume overload. Echo pending. Did order a procalcitonin and came back at 0.31.  Decided to discontinue CAP coverage due to likelihood of volume overload.   Hyponatremia  125 on admission and improved today to 130 Ordered urine Legionella antigen due to possible concern for pneumonia.  Monitor with BMP  Lactic acidosis 2.1 on admission.  Resolved with 500 mL liter normal saline bolus.   Persistent Afib s/p Cardioversion on 09/2024  Rate controlled - Will continue metoprolol  succinate 24 hours for 25 mg   Sick Sinus Syndrome  S/p pacemaker  As noted per cardiology note on 12/12, patient is atrial paced rhythm up to 32% of the time. Ventricular pacing and atrial fibrillation seen most of the time.  - No acute concern inpatient.    BPH Dysuria vs LUTS Bladder scan did not show any signs of infection.  UA showed small bilirubin, 15 ketones and protein.  Acute concerns for infection at this time.     HTN No acute concerns at this time.  Management with metoprolol  25 mg 24 hour at this time.    HLD Continue crestor  10 mg    Hypothyroidism  TSH 3.090.  Within normal limits - Continue levothyroxine  125 mcg at breakfast     12/23: no problems breathing at rest, no orthopnea or PND. With exertion has SOB. Hasn't tried to get up today to walk around. Got up to commode only, but states he could likely walk to bathroom without SOB. Wife is bedside. Denies pain, headaches. At home, had dry cough with SOB. Wife said he was slow in movement and lethargic with lack of energy. Never  needed O2 at home. Currently on 4L. Sleep last night was somewhat ok. Says it burns when he pees, which is not new. Hasd BM this morning, normal and no blood.

## 2024-11-02 DIAGNOSIS — I4819 Other persistent atrial fibrillation: Secondary | ICD-10-CM

## 2024-11-02 DIAGNOSIS — I495 Sick sinus syndrome: Secondary | ICD-10-CM | POA: Diagnosis not present

## 2024-11-02 DIAGNOSIS — E871 Hypo-osmolality and hyponatremia: Secondary | ICD-10-CM

## 2024-11-02 DIAGNOSIS — Z950 Presence of cardiac pacemaker: Secondary | ICD-10-CM | POA: Diagnosis not present

## 2024-11-02 DIAGNOSIS — J189 Pneumonia, unspecified organism: Secondary | ICD-10-CM | POA: Diagnosis not present

## 2024-11-02 DIAGNOSIS — E039 Hypothyroidism, unspecified: Secondary | ICD-10-CM | POA: Diagnosis not present

## 2024-11-02 DIAGNOSIS — I2510 Atherosclerotic heart disease of native coronary artery without angina pectoris: Secondary | ICD-10-CM | POA: Diagnosis not present

## 2024-11-02 DIAGNOSIS — R410 Disorientation, unspecified: Secondary | ICD-10-CM

## 2024-11-02 DIAGNOSIS — R300 Dysuria: Secondary | ICD-10-CM | POA: Diagnosis not present

## 2024-11-02 DIAGNOSIS — N401 Enlarged prostate with lower urinary tract symptoms: Secondary | ICD-10-CM | POA: Diagnosis not present

## 2024-11-02 DIAGNOSIS — I10 Essential (primary) hypertension: Secondary | ICD-10-CM | POA: Diagnosis not present

## 2024-11-02 DIAGNOSIS — J9601 Acute respiratory failure with hypoxia: Secondary | ICD-10-CM | POA: Diagnosis not present

## 2024-11-02 LAB — CBC
HCT: 40.2 % (ref 39.0–52.0)
Hemoglobin: 13.4 g/dL (ref 13.0–17.0)
MCH: 28 pg (ref 26.0–34.0)
MCHC: 33.3 g/dL (ref 30.0–36.0)
MCV: 84.1 fL (ref 80.0–100.0)
Platelets: 230 K/uL (ref 150–400)
RBC: 4.78 MIL/uL (ref 4.22–5.81)
RDW: 14.6 % (ref 11.5–15.5)
WBC: 11.9 K/uL — ABNORMAL HIGH (ref 4.0–10.5)
nRBC: 0 % (ref 0.0–0.2)

## 2024-11-02 LAB — BASIC METABOLIC PANEL WITH GFR
Anion gap: 10 (ref 5–15)
BUN: 23 mg/dL (ref 8–23)
CO2: 25 mmol/L (ref 22–32)
Calcium: 8.4 mg/dL — ABNORMAL LOW (ref 8.9–10.3)
Chloride: 96 mmol/L — ABNORMAL LOW (ref 98–111)
Creatinine, Ser: 1.05 mg/dL (ref 0.61–1.24)
GFR, Estimated: >60 mL/min
Glucose, Bld: 124 mg/dL — ABNORMAL HIGH (ref 70–99)
Potassium: 4.1 mmol/L (ref 3.5–5.1)
Sodium: 131 mmol/L — ABNORMAL LOW (ref 135–145)

## 2024-11-02 LAB — MAGNESIUM: Magnesium: 2.3 mg/dL (ref 1.7–2.4)

## 2024-11-02 MED ORDER — PREDNISONE 20 MG PO TABS
40.0000 mg | ORAL_TABLET | Freq: Every day | ORAL | Status: DC
  Administered 2024-11-02: 40 mg via ORAL
  Filled 2024-11-02: qty 2

## 2024-11-02 NOTE — Consult Note (Signed)
 "  NAME:  [name], MRN:  [id number], DOB:  [birth year], LOS: 1 ADMISSION DATE:  10/31/2024, CONSULTATION DATE:  11/02/2024 REFERRING MD:   , CHIEF COMPLAINT:  Acute hypoxic respiratory failure  History of Present Illness:   The patient, with atrial fibrillation on eliquis , presents with shortness of breath and fatigue and abnormal CT  Dyspnea and fatigue - Shortness of breath and fatigue, improved compared to initial presentation - Oxygen saturation drops when sitting up - No history of pneumonia, COPD, or asthma  Cough - Persistent hacking cough, worse when drinking liquids - Cough improves when drinking slowly - Cough developed as a side effect of amiodarone   Abnormal chest imaging - Recent CT scan abnormal - Chest x-ray in September showed congestion, possibly due to bronchitis  Medication history - Amiodarone  started October 20 at 400 mg for three weeks, then 200 mg; discontinued December 12 due to side effects including cough - Eliquis  for atrial fibrillation  Environmental and occupational exposures - Never smoked - No asbestos, chemical, mold, hot tub, jacuzzi, feather bedding, or pet exposures - Retired chief of staff; previous work in human resources officer  Pertinent  Medical History    has a past medical history of Arthritis, Hyperlipidemia, Hypertension, Hypothyroidism, Paroxysmal atrial fibrillation (HCC) (11/22/2014), and Thrombocytopenia (05/23/2015).   Significant Hospital Events: Including procedures, antibiotic start and stop dates in addition to other pertinent events     Interim History / Subjective:    Objective    Blood pressure (!) 145/71, pulse 78, temperature (!) 97.3 F (36.3 C), resp. rate 18, height 5' 8 (1.727 m), weight 74.3 kg, SpO2 93%.        Intake/Output Summary (Last 24 hours) at 11/02/2024 1517 Last data filed at 11/02/2024 0411 Gross per 24 hour  Intake --  Output 400 ml  Net -400 ml   Filed Weights   10/31/24 1240  11/02/24 0609  Weight: 72.6 kg 74.3 kg    Examination: Blood pressure (!) 145/71, pulse 78, temperature (!) 97.3 F (36.3 C), resp. rate 18, height 5' 8 (1.727 m), weight 74.3 kg, SpO2 93%. Gen:      No acute distress HEENT:  EOMI, sclera anicteric Neck:     No masses; no thyromegaly Lungs:    Clear to auscultation bilaterally; normal respiratory effort CV:         Regular rate and rhythm; no murmurs Abd:      + bowel sounds; soft, non-tender; no palpable masses, no distension Ext:    No edema; adequate peripheral perfusion Neuro: alert and oriented x 3 Psych: normal mood and affect   Resolved problem list   Assessment and Plan  Bilateral pneumonia vs. amiodarone -induced pneumonitis CT scan reveals bilateral infiltrates suggestive of pneumonia. Differential diagnosis includes amiodarone -induced pneumonitis due to recent amiodarone  use for atrial fibrillation management. Symptoms include fatigue, dyspnea, and a hacking cough, potentially exacerbated by amiodarone . Amiodarone  can cause lung inflammation, complicating the diagnosis. He has been off amiodarone  since December 12th.  Unclear if he has underlying interstitial lung disease.  Noted abnormal chest x-ray in September but I am unable to view images and not sure if it is related to this presentation. - Continue current antibiotics to ensure complete treatment if infection is present. - Initiated prednisone  40 mg with a slow taper by 10mg  every week to address potential inflammation. - Ordered baseline CTD serologies. - Will schedule follow-up clinic appointment to reassess condition and get high-resolution CT to assess underlying interstitial lung disease.  Signature:  Ahmon Tosi MD [facility] See Amion for pager  If no response to pager , please call [phone] until 7pm After 7:00 pm call Elink  [phone] 11/02/2024, 3:23 PM        "

## 2024-11-02 NOTE — Progress Notes (Addendum)
 "   HD#1  SUBJECTIVE:  Patient Summary:[name] is an 89 year old man with a history of CAD s/p CABG, sick sinus syndrome s/p PPM, persistent atrial fibrillation on anticoagulation, hypertension, and hypothyroidism who presented with subacute worsening dyspnea and hypoxia, initially concerning for pneumonia versus cardiac etiology, now felt to be primarily volume-mediated respiratory failure with improvement after diuresis and antibiotic.  Overnight Events: NAEON  Interim History: Patient was sleeping in the bed in no acute distress, has nasal cannula on 4 L oxygen and without shortness of breath.  Endorsed respiratory distress with motion, denied any respiratory symptoms while resting.  Denied orthopnea, chest pain.  Had normal urination at bowel movement  OBJECTIVE:  Vital Signs: Vitals:   11/01/24 2146 11/02/24 0023 11/02/24 0405 11/02/24 0609  BP: (!) 110/99 (!) 141/80 135/67   Pulse: 72 82 87   Resp: 18 18 18    Temp: 100.2 F (37.9 C) 99.1 F (37.3 C) (!) 97.5 F (36.4 C)   TempSrc: Oral Oral Axillary   SpO2: 98% 94% 97%   Weight:    74.3 kg  Height:       Supplemental O2: Nasal Cannula SpO2: 97 % O2 Flow Rate (L/min): 4 L/min  Filed Weights   10/31/24 1240 11/02/24 0609  Weight: 72.6 kg 74.3 kg     Intake/Output Summary (Last 24 hours) at 11/02/2024 9378 Last data filed at 11/02/2024 0411 Gross per 24 hour  Intake --  Output 1300 ml  Net -1300 ml   Net IO Since Admission: -2,300 mL [11/02/24 0621]  Physical Exam: Physical Exam Vitals: Afebrile, BP stable, irregularly irregular rhythm with controlled rate, SpO2 mid 90s on 4 lit [name] with exertional desaturation General: Elderly male sitting upright in bed, alert, no acute distress HEENT: Normocephalic, mucous membranes moist Neck: Supple, no JVD Cardiac: Irregularly irregular rhythm, normal S1 and S2, no murmurs Pulmonary: Bibasilar crackles, normal work of breathing at rest Abdomen: Soft, non tender, non  distended Extremities: Warm, well perfused, no edema Neuro: Alert and oriented, no focal deficits  Patient Lines/Drains/Airways Status     Active Line/Drains/Airways     Name Placement date Placement time Site Days   Peripheral IV 10/31/24 20 G Left Antecubital 10/31/24  1456  Antecubital  2   External Urinary Catheter 10/31/24  2118  --  2            Pertinent labs and imaging:      Latest Ref Rng & Units 11/02/2024    2:38 AM 11/01/2024    1:29 AM 10/31/2024    1:14 PM  CBC  WBC 4.0 - 10.5 K/uL 11.9  11.8  15.2   Hemoglobin 13.0 - 17.0 g/dL 86.5  87.2  86.1   Hematocrit 39.0 - 52.0 % 40.2  38.7  42.1   Platelets 150 - 400 K/uL 230  221  255        Latest Ref Rng & Units 11/02/2024    2:38 AM 11/01/2024    1:29 AM 10/31/2024    4:13 PM  CMP  Glucose 70 - 99 mg/dL 875  884    BUN 8 - 23 mg/dL 23  20    Creatinine 9.38 - 1.24 mg/dL 8.94  8.83    Sodium 864 - 145 mmol/L 131  130    Potassium 3.5 - 5.1 mmol/L 4.1  4.1    Chloride 98 - 111 mmol/L 96  96    CO2 22 - 32 mmol/L 25  25  Calcium  8.9 - 10.3 mg/dL 8.4  8.3    Total Protein 6.5 - 8.1 g/dL   6.0   Total Bilirubin 0.0 - 1.2 mg/dL   0.9   Alkaline Phos 38 - 126 U/L   83   AST 15 - 41 U/L   36   ALT 0 - 44 U/L   21     ECHOCARDIOGRAM COMPLETE Result Date: 11/01/2024    ECHOCARDIOGRAM REPORT   Patient Name:   [name] Date of Exam: 11/01/2024 Medical Rec #:  [id number]    Height:       68.0 in Accession #:    [id number]   Weight:       160.0 lb Date of Birth:  1932-11-19     BSA:          1.859 m Patient Age:    89 years     BP:           120/67 mmHg Patient Gender: M            HR:           88 bpm. Exam Location:  Inpatient Procedure: 2D Echo, Cardiac Doppler and Color Doppler (Both Spectral and Color            Flow Doppler were utilized during procedure). Indications:    Atrial Fibrillation  History:        Patient has prior history of Echocardiogram examinations, most                 recent 05/03/2022.  CAD, Pacemaker and Prior CABG,                 Arrythmias:Atrial Fibrillation; Risk Factors:Hypertension.  Sonographer:    Merlynn Argyle Referring Phys: EMILY BENDER IMPRESSIONS  1. Left ventricular ejection fraction, by estimation, is 60 to 65%. The left ventricle has normal function. The left ventricle has no regional wall motion abnormalities. There is mild concentric left ventricular hypertrophy. Left ventricular diastolic parameters are indeterminate.  2. Right ventricular systolic function is normal. The right ventricular size is normal. There is mildly elevated pulmonary artery systolic pressure. The estimated right ventricular systolic pressure is 41.2 mmHg.  3. Left atrial size was severely dilated.  4. The mitral valve is degenerative. Trivial mitral valve regurgitation. No evidence of mitral stenosis.  5. Tricuspid valve regurgitation is moderate.  6. The aortic valve is tricuspid. There is mild calcification of the aortic valve. Aortic valve regurgitation is mild. Aortic valve sclerosis/calcification is present, without any evidence of aortic stenosis.  7. Aortic dilatation noted. There is borderline dilatation of the aortic root, measuring 39 mm.  8. The inferior vena cava is normal in size with greater than 50% respiratory variability, suggesting right atrial pressure of 3 mmHg. FINDINGS  Left Ventricle: Left ventricular ejection fraction, by estimation, is 60 to 65%. The left ventricle has normal function. The left ventricle has no regional wall motion abnormalities. The left ventricular internal cavity size was normal in size. There is  mild concentric left ventricular hypertrophy. Left ventricular diastolic parameters are indeterminate. Right Ventricle: The right ventricular size is normal. No increase in right ventricular wall thickness. Right ventricular systolic function is normal. There is mildly elevated pulmonary artery systolic pressure. The tricuspid regurgitant velocity is 3.09  m/s, and with  an assumed right atrial pressure of 3 mmHg, the estimated right ventricular systolic pressure is 41.2 mmHg. Left Atrium: Left atrial size was severely dilated. Right Atrium: Right  atrial size was normal in size. Pericardium: There is no evidence of pericardial effusion. Mitral Valve: The mitral valve is degenerative in appearance. Trivial mitral valve regurgitation. No evidence of mitral valve stenosis. Tricuspid Valve: The tricuspid valve is normal in structure. Tricuspid valve regurgitation is moderate . No evidence of tricuspid stenosis. Aortic Valve: The aortic valve is tricuspid. There is mild calcification of the aortic valve. Aortic valve regurgitation is mild. Aortic valve sclerosis/calcification is present, without any evidence of aortic stenosis. Pulmonic Valve: The pulmonic valve was normal in structure. Pulmonic valve regurgitation is mild. No evidence of pulmonic stenosis. Aorta: Aortic dilatation noted. There is borderline dilatation of the aortic root, measuring 39 mm. Venous: The inferior vena cava is normal in size with greater than 50% respiratory variability, suggesting right atrial pressure of 3 mmHg. IAS/Shunts: No atrial level shunt detected by color flow Doppler. Additional Comments: A device lead is visualized.  LEFT VENTRICLE PLAX 2D LVIDd:         3.80 cm LVIDs:         2.70 cm LV PW:         1.10 cm LV IVS:        1.40 cm LVOT diam:     1.90 cm LV SV:         46 LV SV Index:   25 LVOT Area:     2.84 cm  RIGHT VENTRICLE             IVC RV Basal diam:  3.80 cm     IVC diam: 1.40 cm RV S prime:     13.10 cm/s TAPSE (M-mode): 1.0 cm LEFT ATRIUM             Index        RIGHT ATRIUM           Index LA diam:        4.80 cm 2.58 cm/m   RA Area:     19.10 cm LA Vol (A2C):   66.7 ml 35.88 ml/m  RA Volume:   52.00 ml  27.97 ml/m LA Vol (A4C):   84.7 ml 45.57 ml/m LA Biplane Vol: 77.7 ml 41.80 ml/m  AORTIC VALVE LVOT Vmax:   86.20 cm/s LVOT Vmean:  67.900 cm/s LVOT VTI:    0.162 m  AORTA Ao  Root diam: 3.90 cm Ao Asc diam:  3.30 cm TRICUSPID VALVE TR Peak grad:   38.2 mmHg TR Vmax:        309.00 cm/s  SHUNTS Systemic VTI:  0.16 m Systemic Diam: 1.90 cm Ian Fuel MD Electronically signed by Ian Fuel MD Signature Date/Time: 11/01/2024/10:50:44 PM    Final    CT CHEST WO CONTRAST Result Date: 11/01/2024 CLINICAL DATA:  Hypoxia. EXAM: CT CHEST WITHOUT CONTRAST TECHNIQUE: Multidetector CT imaging of the chest was performed following the standard protocol without IV contrast. RADIATION DOSE REDUCTION: This exam was performed according to the departmental dose-optimization program which includes automated exposure control, adjustment of the mA and/or kV according to patient size and/or use of iterative reconstruction technique. COMPARISON:  Chest radiograph dated 10/31/2024. FINDINGS: Evaluation of this exam is limited in the absence of intravenous contrast. Cardiovascular: There is no cardiomegaly or pericardial effusion. There is 3 vessel coronary vascular calcification. Left pectoral pacemaker device. Moderate atherosclerotic calcification of the thoracic aorta. No aneurysmal dilatation. The central pulmonary arteries are grossly unremarkable Mediastinum/Nodes: No hilar or mediastinal adenopathy. The esophagus is grossly unremarkable. No mediastinal fluid collection. Lungs/Pleura: Small  bilateral pleural effusions. Bilateral patchy ground-glass density and areas of consolidation most consistent with multifocal pneumonia. An area of pleural based thickening and anterior left upper lobe (23/2) may represent a small pleural effusion or an area of airspace disease. A mass is not excluded. Follow-up to resolution recommended. There is no pneumothorax. The central airways are patent. Upper Abdomen: Cholecystectomy. Musculoskeletal: Osteopenia with degenerative changes of the spine. Median sternotomy wires. No acute osseous pathology. IMPRESSION: 1. Multifocal pneumonia. Follow-up to resolution  recommended. 2. Small bilateral pleural effusions. 3.  Aortic Atherosclerosis (ICD10-I70.0). Electronically Signed   By: Vanetta Chou M.D.   On: 11/01/2024 18:10    ASSESSMENT/PLAN:  Assessment: Principal Problem:   Acute hypoxic respiratory failure (HCC) Active Problems:   CAD s/p CABG 2007   SSS (sick sinus syndrome) (HCC)   Pacemaker - [employer] Enrhythm 2007   Essential hypertension   Acquired hypothyroidism   Benign prostatic hyperplasia with urinary hesitancy   Persistent atrial fibrillation (HCC)   Hyponatremia   Plan: [name] is an 89 year old man with persistent atrial fibrillation on apixaban  status post failed cardioversion and amiodarone  intolerance, sick sinus syndrome status post permanent pacemaker, CAD status post CABG with preserved LV systolic function, hypertension, and hypothyroidism, who presents with one month of progressive dyspnea and cough with acute worsening over one week, found to have acute hypoxic respiratory failure with bilateral interstitial and patchy pulmonary opacities and leukocytosis, clinically euvolemic with preserved EF, felt most consistent with multifocal CAP or inflammatory lung disease rather than heart failure or amiodarone  toxicity, now improving at rest but with persistent exertional hypoxia requiring supplemental oxygen.  #Acute Hypoxic Respiratory Failure [name] presents with subacute shortness of breath that began about one month ago and acutely worsened over the week prior to admission. His symptoms were initially accompanied by a dry cough that has since improved. During hospitalization, his breathing has steadily improved at rest, but he continues to demonstrate significant exertional hypoxia, with desaturation into the mid 80s when standing or ambulating despite supplemental oxygen. This pattern suggests a persistent pulmonary process rather than a purely cardiac cause.  Importantly, there is no clinical evidence of volume  overload. He has no peripheral edema, no JVD, normal IVC on echocardiogram, and cardiology documentation from earlier this month explicitly notes absence of edema or dyspnea at that time. His echocardiogram shows preserved systolic function with chronic atrial enlargement but no acute decompensated heart failure. While BNP is elevated, this is likely reflective of chronic atrial fibrillation and age rather than acute volume excess.  Chest imaging shows bilateral interstitial and patchy opacities. Notably, similar inflammatory and interstitial changes were present on chest imaging from September, predating amiodarone  initiation, which lowers concern for amiodarone  pulmonary toxicity as a primary driver and raising concern for possible IPF, ILD.  Infectious evaluation so far is negative for COVID, influenza, and RSV, with extended respiratory pathogen testing pending. Given leukocytosis on admission, imaging findings, cough history, and persistent exertional oxygen requirement, multifocal pneumonia or inflammatory lung disease remains the leading etiology.  Overall, his hypoxic respiratory failure is felt to be primarily pulmonary and infectious or inflammatory in nature, with gradual improvement but incomplete resolution.  Plan - Pulmonology consult  - Continue ceftriaxone  and azithromycin  for community acquired pneumonia - Supplemental oxygen with weaning as tolerated - Daily ambulatory pulse oximetry - Incentive spirometry and pulmonary hygiene - Follow respiratory pathogen panel - No further diuresis at this time  #Community Acquired Pneumonia Presentation with cough, leukocytosis, and bilateral  infiltrates on chest imaging is consistent with pneumonia. Although initial improvement occurred early, he continues to have exertional hypoxia, supporting continuation of treatment. Plan - Continue ceftriaxone  and azithromycin  - Monitor CBC and fever curve - Reassess need for antibiotics based on  clinical trajectory  #Hyponatremia Hyponatremia on admission likely reflects decreased oral intake and acute illness rather than hypervolemia. He has remained clinically euvolemic on exam, and sodium has been improving with supportive care. Plan - Encourage oral intake - Daily BMP - No IV fluids unless sodium worsens or symptoms develop  #Persistent Atrial Fibrillation He has persistent atrial fibrillation with early recurrence after two cardioversions, including one after amiodarone  loading. He experienced significant side effects on amiodarone , including tremor, malaise, and cough, and this medication was appropriately discontinued by cardiology. Given his age, CAD, and patient preference, rhythm control options are limited, and the plan is long term rate control with anticoagulation. He remains rate controlled at rest. Plan - Continue metoprolol  succinate 25 mg daily - Continue apixaban  5 mg twice daily - Telemetry monitoring while inpatient  #Sick Sinus Syndrome Status Post Pacemaker Pacemaker was placed prior to development of persistent atrial fibrillation. Recent interrogation shows appropriate function with intermittent ventricular pacing. No device related concerns at this time. Plan - No inpatient intervention - Continue routine outpatient pacemaker follow up  #Benign Prostatic Hyperplasia With LUTS He reports one month of dysuria and lower urinary tract symptoms. He previously stopped tamsulosin due to dizziness. No evidence of acute urinary retention on exam. Plan - Bladder scan if symptoms worsen - Avoid alpha blockers inpatient given prior intolerance  #Coronary Artery Disease Status Post CABG He remains asymptomatic from an ischemic standpoint. Prior stress testing was low risk, and echocardiogram shows preserved systolic function. Plan - Continue rosuvastatin  - Continue secondary prevention  #Hypothyroidism He is on stable levothyroxine  dosing. Given reported weight  loss and fatigue, reassessment of thyroid  function is reasonable. Plan - Continue levothyroxine  125 mcg daily   #Hypertension Blood pressures have been acceptable during hospitalization. Plan - Continue metoprolol  - Monitor vitals  Best Practice: Diet: Regular diet IVF: None VTE: Apixaban   Code: DNR  Disposition planning: Therapy Recs: HHOT/PT Family Contact:  at bedside. DISPO: Anticipated discharge in 2 days to  pending medical readiness.  Signature:  [name]  6:21 AM, 11/02/2024  On Call pager [phone]  "

## 2024-11-02 NOTE — Progress Notes (Signed)
 Physical Therapy Treatment Patient Details Name: [name] MRN: [id number] DOB: May 14, 1933 Today's Date: 11/02/2024   History of Present Illness Pt is an 89 year old man who presented to [facility] on 10/31/24 with acute hypoxic respiratory failure, cough, generalized weakness, fatigue and recent weight loss. Respiratory likely due to volume overload. PMH: CAD s/p CABG, SSS s/p PCM, HTN, SVT, BPH, hypothyroidism.    PT Comments  Pt received in supine and agreeable to session with wife present and supportive throughout. Pt able to perform bed mobility, transfers, and short ambulation distance with min A. Pt desats to 78% on 6L during ambulation requiring >5 mins and increase to 7L to improve to >88%. Pt noted to cough with deep breaths and reports pain in R side. Pt continues to benefit from PT services to progress toward functional mobility goals.     If plan is discharge home, recommend the following: A lot of help with walking and/or transfers;A lot of help with bathing/dressing/bathroom;Assist for transportation;Help with stairs or ramp for entrance;Assistance with cooking/housework   Can travel by private vehicle        Equipment Recommendations  None recommended by PT    Recommendations for Other Services       Precautions / Restrictions Precautions Precautions: Fall;Other (comment) Precaution/Restrictions Comments: watch O2 Restrictions Weight Bearing Restrictions Per Provider Order: No     Mobility  Bed Mobility Overal bed mobility: Needs Assistance Bed Mobility: Supine to Sit     Supine to sit: Contact guard, HOB elevated, Used rails     General bed mobility comments: increased time    Transfers Overall transfer level: Needs assistance Equipment used: Rolling walker (2 wheels) Transfers: Sit to/from Stand Sit to Stand: Min assist           General transfer comment: cues for hand placement and light min A for anterior weight shift     Ambulation/Gait Ambulation/Gait assistance: Min assist Gait Distance (Feet): 10 Feet Assistive device: Rolling walker (2 wheels) Gait Pattern/deviations: Step-to pattern, Decreased step length - right, Decreased step length - left, Shuffle, Trunk flexed, Decreased stride length, Narrow base of support       General Gait Details: Very short steps with low foot clearance. Assist for balance and line management. Cues for obstacle negotiation   Stairs             Wheelchair Mobility     Tilt Bed    Modified Rankin (Stroke Patients Only)       Balance Overall balance assessment: Needs assistance Sitting-balance support: Bilateral upper extremity supported, Feet supported Sitting balance-Leahy Scale: Fair Sitting balance - Comments: CGA sitting EOB   Standing balance support: Bilateral upper extremity supported, During functional activity, Reliant on assistive device for balance Standing balance-Leahy Scale: Poor Standing balance comment: with RW support                            Communication Communication Communication: Impaired Factors Affecting Communication: Hearing impaired  Cognition Arousal: Alert Behavior During Therapy: WFL for tasks assessed/performed   PT - Cognitive impairments: No apparent impairments                         Following commands: Intact      Cueing Cueing Techniques: Verbal cues  Exercises      General Comments General comments (skin integrity, edema, etc.): SpO2 90% on 4L sitting EOB and down to 78%  on 6L with ambulation. Pt requires extended time, cues for pursed lip breathing, and increase to 7L for improvement to >88%      Pertinent Vitals/Pain Pain Assessment Pain Assessment: Faces Faces Pain Scale: Hurts a little bit Pain Location: R chest with coughing Pain Descriptors / Indicators: Aching, Grimacing Pain Intervention(s): Monitored during session     PT Goals (current goals can now be found  in the care plan section) Acute Rehab PT Goals Patient Stated Goal: return home PT Goal Formulation: With patient Time For Goal Achievement: 11/15/24 Progress towards PT goals: Progressing toward goals    Frequency    Min 2X/week       AM-PAC PT 6 Clicks Mobility   Outcome Measure  Help needed turning from your back to your side while in a flat bed without using bedrails?: A Little Help needed moving from lying on your back to sitting on the side of a flat bed without using bedrails?: A Little Help needed moving to and from a bed to a chair (including a wheelchair)?: A Little Help needed standing up from a chair using your arms (e.g., wheelchair or bedside chair)?: A Little Help needed to walk in hospital room?: Total (>39ft) Help needed climbing 3-5 steps with a railing? : Total 6 Click Score: 14    End of Session Equipment Utilized During Treatment: Gait belt;Oxygen Activity Tolerance: Patient limited by fatigue;Other (comment) (desat) Patient left: in chair;with family/visitor present;with call bell/phone within reach Nurse Communication: Mobility status;Other (comment) (O2 sats) PT Visit Diagnosis: Unsteadiness on feet (R26.81);Other abnormalities of gait and mobility (R26.89);Muscle weakness (generalized) (M62.81)     Time: 8792-8764 PT Time Calculation (min) (ACUTE ONLY): 28 min  Charges:    $Gait Training: 8-22 mins $Therapeutic Activity: 8-22 mins PT General Charges $$ ACUTE PT VISIT: 1 Visit                    Darryle George, PTA Acute Rehabilitation Services Secure Chat Preferred  [phone]    Darryle George 11/02/2024, 1:16 PM

## 2024-11-02 NOTE — Plan of Care (Signed)

## 2024-11-03 ENCOUNTER — Other Ambulatory Visit (HOSPITAL_COMMUNITY): Payer: Self-pay

## 2024-11-03 DIAGNOSIS — J189 Pneumonia, unspecified organism: Secondary | ICD-10-CM | POA: Diagnosis not present

## 2024-11-03 DIAGNOSIS — I2510 Atherosclerotic heart disease of native coronary artery without angina pectoris: Secondary | ICD-10-CM | POA: Diagnosis not present

## 2024-11-03 DIAGNOSIS — J9601 Acute respiratory failure with hypoxia: Secondary | ICD-10-CM | POA: Diagnosis not present

## 2024-11-03 DIAGNOSIS — I10 Essential (primary) hypertension: Secondary | ICD-10-CM | POA: Diagnosis not present

## 2024-11-03 LAB — LEGIONELLA PNEUMOPHILA SEROGP 1 UR AG: L. pneumophila Serogp 1 Ur Ag: NEGATIVE

## 2024-11-03 LAB — BASIC METABOLIC PANEL WITH GFR
Anion gap: 9 (ref 5–15)
Anion gap: 9 (ref 5–15)
BUN: 26 mg/dL — ABNORMAL HIGH (ref 8–23)
BUN: 27 mg/dL — ABNORMAL HIGH (ref 8–23)
CO2: 26 mmol/L (ref 22–32)
CO2: 25 mmol/L (ref 22–32)
Calcium: 8.8 mg/dL — ABNORMAL LOW (ref 8.9–10.3)
Calcium: 8.8 mg/dL — ABNORMAL LOW (ref 8.9–10.3)
Chloride: 96 mmol/L — ABNORMAL LOW (ref 98–111)
Chloride: 95 mmol/L — ABNORMAL LOW (ref 98–111)
Creatinine, Ser: 1.08 mg/dL (ref 0.61–1.24)
Creatinine, Ser: 1.08 mg/dL (ref 0.61–1.24)
GFR, Estimated: >60 mL/min
GFR, Estimated: >60 mL/min
Glucose, Bld: 195 mg/dL — ABNORMAL HIGH (ref 70–99)
Glucose, Bld: 139 mg/dL — ABNORMAL HIGH (ref 70–99)
Potassium: 5.7 mmol/L — ABNORMAL HIGH (ref 3.5–5.1)
Potassium: 5 mmol/L (ref 3.5–5.1)
Sodium: 130 mmol/L — ABNORMAL LOW (ref 135–145)
Sodium: 129 mmol/L — ABNORMAL LOW (ref 135–145)

## 2024-11-03 LAB — CBC
HCT: 40.1 % (ref 39.0–52.0)
Hemoglobin: 13 g/dL (ref 13.0–17.0)
MCH: 27.9 pg (ref 26.0–34.0)
MCHC: 32.4 g/dL (ref 30.0–36.0)
MCV: 86.1 fL (ref 80.0–100.0)
Platelets: 210 K/uL (ref 150–400)
RBC: 4.66 MIL/uL (ref 4.22–5.81)
RDW: 14.6 % (ref 11.5–15.5)
WBC: 10.1 K/uL (ref 4.0–10.5)
nRBC: 0 % (ref 0.0–0.2)

## 2024-11-03 LAB — RESPIRATORY PANEL BY PCR

## 2024-11-03 MED ORDER — PREDNISONE 20 MG PO TABS
40.0000 mg | ORAL_TABLET | Freq: Every day | ORAL | Status: DC
  Administered 2024-11-03: 40 mg via ORAL

## 2024-11-03 MED ORDER — PREDNISONE 10 MG PO TABS: 5.0000 mg | ORAL_TABLET | Freq: Every day | ORAL | Status: DC

## 2024-11-03 MED ORDER — PREDNISONE 5 MG PO TABS
ORAL_TABLET | ORAL | 0 refills | Status: DC
  Filled 2024-11-03: qty 135, 40d supply, fill #0

## 2024-11-03 MED ORDER — SODIUM ZIRCONIUM CYCLOSILICATE 10 G PO PACK
10.0000 g | PACK | Freq: Once | ORAL | Status: DC
  Filled 2024-11-03: qty 1

## 2024-11-03 MED ORDER — PREDNISONE 10 MG PO TABS: 10.0000 mg | ORAL_TABLET | Freq: Every day | ORAL | Status: DC

## 2024-11-03 MED ORDER — PREDNISONE 2.5 MG PO TABS: 2.5000 mg | ORAL_TABLET | Freq: Every day | ORAL | Status: DC

## 2024-11-03 MED ORDER — METOPROLOL SUCCINATE ER 25 MG PO TB24
25.0000 mg | ORAL_TABLET | Freq: Every day | ORAL | 0 refills | Status: DC
  Filled 2024-11-03: qty 30, 30d supply, fill #0

## 2024-11-03 MED ORDER — PREDNISONE 20 MG PO TABS: 30.0000 mg | ORAL_TABLET | Freq: Every day | ORAL | Status: DC

## 2024-11-03 MED ORDER — PREDNISONE 20 MG PO TABS
40.0000 mg | ORAL_TABLET | Freq: Every day | ORAL | Status: DC
  Filled 2024-11-03: qty 2

## 2024-11-03 MED ORDER — PREDNISONE 20 MG PO TABS: 20.0000 mg | ORAL_TABLET | Freq: Every day | ORAL | Status: DC

## 2024-11-03 NOTE — Plan of Care (Signed)
   Problem: Clinical Measurements: Goal: Ability to maintain clinical measurements within normal limits will improve Outcome: Progressing

## 2024-11-03 NOTE — Progress Notes (Signed)
 "  NAME:  [name], MRN:  [id number], DOB:  1933-04-04, LOS: 2 ADMISSION DATE:  10/31/2024, CONSULTATION DATE:  11/02/2024 REFERRING MD:   , CHIEF COMPLAINT:  Acute hypoxic respiratory failure  History of Present Illness:   The patient, with atrial fibrillation on eliquis , presents with shortness of breath and fatigue and abnormal CT  Dyspnea and fatigue - Shortness of breath and fatigue, improved compared to initial presentation - Oxygen saturation drops when sitting up - No history of pneumonia, COPD, or asthma  Cough - Persistent hacking cough, worse when drinking liquids - Cough improves when drinking slowly - Cough developed as a side effect of amiodarone   Abnormal chest imaging - Recent CT scan abnormal - Chest x-ray in September showed congestion, possibly due to bronchitis  Medication history - Amiodarone  started October 20 at 400 mg for three weeks, then 200 mg; discontinued December 12 due to side effects including cough - Eliquis  for atrial fibrillation  Environmental and occupational exposures - Never smoked - No asbestos, chemical, mold, hot tub, jacuzzi, feather bedding, or pet exposures - Retired chief of staff; previous work in human resources officer  Pertinent  Medical History    has a past medical history of Arthritis, Hyperlipidemia, Hypertension, Hypothyroidism, Paroxysmal atrial fibrillation (HCC) (11/22/2014), and Thrombocytopenia (05/23/2015).   Significant Hospital Events: Including procedures, antibiotic start and stop dates in addition to other pertinent events   12/23 PCCM consulted.  Started prednisone  40 mg/day  Interim History / Subjective:   Daughter tells me that he is a little more confused with mild delirium today Breathing is stable.  Remains on supplemental oxygen  Objective    Blood pressure (!) 145/89, pulse 87, temperature 97.7 F (36.5 C), temperature source Oral, resp. rate 18, height 5' 8 (1.727 m), weight 71.7 kg, SpO2 92%.         Intake/Output Summary (Last 24 hours) at 11/03/2024 1111 Last data filed at 11/02/2024 2300 Gross per 24 hour  Intake 200 ml  Output 200 ml  Net 0 ml   Filed Weights   10/31/24 1240 11/02/24 0609 11/03/24 0443  Weight: 72.6 kg 74.3 kg 71.7 kg    Examination: Elderly male in no distress Somnolent, arousable.  Able to answer questions appropriately Lungs are clear to auscultation Heart rate regular rate and rhythm   Resolved problem list   Assessment and Plan  Bilateral pneumonia vs. amiodarone -induced pneumonitis CT scan reveals bilateral infiltrates suggestive of pneumonia. Differential diagnosis includes amiodarone -induced pneumonitis due to recent amiodarone  use for atrial fibrillation management. Symptoms include fatigue, dyspnea, and a hacking cough, potentially exacerbated by amiodarone . Amiodarone  can cause lung inflammation, complicating the diagnosis. He has been off amiodarone  since December 12th.  Unclear if he has underlying interstitial lung disease.  Noted abnormal chest x-ray in September but I am unable to view images and not sure if it is related to this presentation. - Continue current antibiotics to ensure complete treatment if infection is present. - Initiated prednisone  40 mg with a slow taper by 10mg  every week to address potential inflammation.  If he continues to have delirium then we may drop the dose more rapidly. - Follow baseline CTD serologies. - Will schedule follow-up clinic appointment to reassess condition and get high-resolution CT to assess underlying interstitial lung disease.  Discussed with primary team.  Signature:   Breasia Karges MD [facility] See Amion for pager  If no response to pager , please call [phone] until 7pm After 7:00  pm call Elink  [phone] 11/03/2024, 11:11 AM        "

## 2024-11-03 NOTE — Progress Notes (Signed)
 "  HD#2 SUBJECTIVE:  Patient Summary:[name] is an 89 year old man with a history of CAD s/p CABG, sick sinus syndrome s/p permanent pacemaker, persistent atrial fibrillation on apixaban , hypertension, hypothyroidism, who presented with subacute progressive dyspnea and hypoxia and was admitted for acute hypoxic respiratory failure, initially concerning for pneumonia versus cardiac or inflammatory lung disease.  Overnight Events: NAEON  Interim History:Patient was seen sitting comfortably in a chair, in no acute distress, on 2.5 L nasal cannula with o2 sat 93%. He denies chest pain, fevers, chills, abdominal pain, or bowel issues. He continues to endorse shortness of breath with exertion, particularly when standing or ambulating. Daughter present at bedside; differential diagnosis and plan of care were reviewed, including anticipated discharge possibly tomorrow with home oxygen and home health services.   OBJECTIVE:  Vital Signs: Vitals:   11/03/24 0001 11/03/24 0358 11/03/24 0443 11/03/24 0853  BP: 133/83 119/65  (!) 145/89  Pulse: 81 78  87  Resp:    18  Temp: 98.1 F (36.7 C) 97.7 F (36.5 C)    TempSrc: Oral Oral    SpO2: (!) 89% 92%  92%  Weight:   71.7 kg   Height:       Supplemental O2: Nasal Cannula SpO2: 92 % O2 Flow Rate (L/min): 2 L/min  Filed Weights   10/31/24 1240 11/02/24 0609 11/03/24 0443  Weight: 72.6 kg 74.3 kg 71.7 kg     Intake/Output Summary (Last 24 hours) at 11/03/2024 1004 Last data filed at 11/02/2024 2300 Gross per 24 hour  Intake 200 ml  Output 200 ml  Net 0 ml   Net IO Since Admission: -2,300 mL [11/03/24 1004]  Physical Exam: Physical Exam General: Elderly male sitting upright in bed, alert, no acute distress HEENT: Normocephalic, mucous membranes moist Neck: Supple, no JVD Cardiac: Irregularly irregular rhythm, normal S1 and S2, no murmurs Pulmonary: Bibasilar crackles, normal work of breathing at rest Abdomen: Soft, non tender,  non distended Extremities: Warm, well perfused, no edema Neuro: Alert and oriented, no focal deficits   Patient Lines/Drains/Airways Status     Active Line/Drains/Airways     Name Placement date Placement time Site Days   Peripheral IV 10/31/24 20 G Left Antecubital 10/31/24  1456  Antecubital  3   External Urinary Catheter 10/31/24  2118  --  3            Pertinent labs and imaging:      Latest Ref Rng & Units 11/03/2024    3:52 AM 11/02/2024    2:38 AM 11/01/2024    1:29 AM  CBC  WBC 4.0 - 10.5 K/uL 10.1  11.9  11.8   Hemoglobin 13.0 - 17.0 g/dL 86.9  86.5  87.2   Hematocrit 39.0 - 52.0 % 40.1  40.2  38.7   Platelets 150 - 400 K/uL 210  230  221        Latest Ref Rng & Units 11/03/2024    7:53 AM 11/03/2024    3:52 AM 11/02/2024    2:38 AM  CMP  Glucose 70 - 99 mg/dL 860  804  875   BUN 8 - 23 mg/dL 27  26  23    Creatinine 0.61 - 1.24 mg/dL 8.91  8.91  8.94   Sodium 135 - 145 mmol/L 129  130  131   Potassium 3.5 - 5.1 mmol/L 5.0  5.7  4.1   Chloride 98 - 111 mmol/L 95  96  96   CO2 22 -  32 mmol/L 25  26  25    Calcium  8.9 - 10.3 mg/dL 8.8  8.8  8.4     No results found.  ASSESSMENT/PLAN:  Assessment: Principal Problem:   Acute hypoxic respiratory failure (HCC) Active Problems:   CAD s/p CABG 2007   SSS (sick sinus syndrome) (HCC)   Pacemaker - Medtronic Enrhythm 2007   Essential hypertension   Acquired hypothyroidism   Benign prostatic hyperplasia with urinary hesitancy   Persistent atrial fibrillation (HCC)   Hyponatremia   Plan: [name] is an 89 year old man with a history of CAD s/p CABG, sick sinus syndrome s/p permanent pacemaker, persistent atrial fibrillation on apixaban , hypertension, hypothyroidism, who presented with subacute progressive dyspnea and hypoxia and was admitted for acute hypoxic respiratory failure, initially concerning for pneumonia versus cardiac or inflammatory lung disease.  #Acute Hypoxic Respiratory Failure  Improving Patient presented with hypoxia, orthopnea, and bilateral interstitial opacities. Initial concern included pneumonia; however, clinical course favors volume-mediated respiratory failure with a possible inflammatory component. He improved rapidly with diuresis, has low procalcitonin, negative viral panel, and no bacteremia. Persistent exertional hypoxia prompted pulmonology evaluation, raising concern for amiodarone -induced pneumonitis versus organizing pneumonia, with underlying ILD not excluded.  Pulmonology was consulted given persistent exertional hypoxia and bilateral infiltrates; they feel the presentation is most consistent with inflammatory lung disease (including possible amiodarone  pneumonitis) and recommend steroids with planned outpatient HRCT once inflammation resolves.  Plan: - Continue ceftriaxone  (last dose tomorrow); azithromycin  completed - Continue prednisone  40 mg BID with planned slow taper per Pulmonology - Wean oxygen as tolerated (goal SpO? >92%) - Incentive spirometry, pulmonary hygiene - Ambulatory pulse oximetry - Plan for outpatient high-resolution CT chest after inflammation resolves  #Volume Overload / Suspected ADHF (HFpEF physiology) Despite absence of peripheral edema, patient demonstrates volume sensitivity with elevated BNP, pleural effusions, orthopnea, JVD, and rapid response to diuresis. TTE shows preserved EF with severe LA enlargement and moderate TR. Plan: - Maintain euvolemia - Strict I/Os, daily weights - Avoid IV fluids - Monitor renal function and electrolytes daily  #Hyperkalemia  Resolved Potassium peaked at 5.7, likely multifactorial in the setting of acute illness and reduced renal potassium excretion. ECG showed no acute hyperkalemic changes. Treated with Lokelma , with improvement to 5.0. Plan: - Continue daily BMP - Low potassium diet - No additional Lokelma  at this time  # Hyponatremia  Improving Likely due to hypervolemia and  reduced oral intake. Improving with diuresis and clinical stabilization. Plan: - Continue volume management - No IV fluids - Daily BMP  #Persistent Atrial Fibrillation He has persistent atrial fibrillation with early recurrence after two cardioversions, including one after amiodarone  loading. He experienced significant side effects on amiodarone , including tremor, malaise, and cough, and this medication was appropriately discontinued by cardiology. Given his age, CAD, and patient preference, rhythm control options are limited, and the plan is long term rate control with anticoagulation. He remains rate controlled at rest. Plan - Continue metoprolol  succinate 25 mg daily - Continue apixaban  5 mg twice daily - Telemetry monitoring while inpatient  #Sick Sinus Syndrome Status Post Pacemaker Pacemaker was placed prior to development of persistent atrial fibrillation. Recent interrogation shows appropriate function with intermittent ventricular pacing. No device related concerns at this time. Plan - No inpatient intervention - Continue routine outpatient pacemaker follow up  #Benign Prostatic Hyperplasia With LUTS He reports one month of dysuria and lower urinary tract symptoms. He previously stopped tamsulosin due to dizziness. No evidence of acute urinary retention on exam. Plan -  Bladder scan if symptoms worsen - Avoid alpha blockers inpatient given prior intolerance  #Coronary Artery Disease Status Post CABG He remains asymptomatic from an ischemic standpoint. Prior stress testing was low risk, and echocardiogram shows preserved systolic function. Plan - Continue rosuvastatin  - Continue secondary prevention  #Hypothyroidism He is on stable levothyroxine  dosing. Given reported weight loss and fatigue, reassessment of thyroid  function is reasonable. Plan - Continue levothyroxine  125 mcg daily    Patient is clinically improving. Anticipate discharge tomorrow pending oxygen needs  assessment, with home oxygen and home health.   Best Practice: Diet: Regular diet IVF: None VTE: Apixaban   Code: DNR   Disposition planning: Therapy Recs: HHOT/PT Family Contact:  at bedside. DISPO: Anticipated discharge in 2 days to  pending medical readiness.  Signature:  Denton Derks [facility]  10:04 AM, 11/03/2024  On Call pager [phone]  "

## 2024-11-04 ENCOUNTER — Inpatient Hospital Stay (HOSPITAL_COMMUNITY)

## 2024-11-04 DIAGNOSIS — J9601 Acute respiratory failure with hypoxia: Secondary | ICD-10-CM | POA: Diagnosis not present

## 2024-11-04 DIAGNOSIS — I2510 Atherosclerotic heart disease of native coronary artery without angina pectoris: Secondary | ICD-10-CM | POA: Diagnosis not present

## 2024-11-04 DIAGNOSIS — J189 Pneumonia, unspecified organism: Secondary | ICD-10-CM | POA: Diagnosis not present

## 2024-11-04 DIAGNOSIS — I10 Essential (primary) hypertension: Secondary | ICD-10-CM | POA: Diagnosis not present

## 2024-11-04 LAB — BASIC METABOLIC PANEL WITH GFR
Anion gap: 12 (ref 5–15)
BUN: 23 mg/dL (ref 8–23)
CO2: 23 mmol/L (ref 22–32)
Calcium: 8.8 mg/dL — ABNORMAL LOW (ref 8.9–10.3)
Chloride: 92 mmol/L — ABNORMAL LOW (ref 98–111)
Creatinine, Ser: 0.95 mg/dL (ref 0.61–1.24)
GFR, Estimated: >60 mL/min
Glucose, Bld: 144 mg/dL — ABNORMAL HIGH (ref 70–99)
Potassium: 4.5 mmol/L (ref 3.5–5.1)
Sodium: 127 mmol/L — ABNORMAL LOW (ref 135–145)

## 2024-11-04 LAB — CBC
HCT: 41.5 % (ref 39.0–52.0)
Hemoglobin: 13.8 g/dL (ref 13.0–17.0)
MCH: 28 pg (ref 26.0–34.0)
MCHC: 33.3 g/dL (ref 30.0–36.0)
MCV: 84.2 fL (ref 80.0–100.0)
Platelets: 206 K/uL (ref 150–400)
RBC: 4.93 MIL/uL (ref 4.22–5.81)
RDW: 14.5 % (ref 11.5–15.5)
WBC: 16.7 K/uL — ABNORMAL HIGH (ref 4.0–10.5)
nRBC: 0 % (ref 0.0–0.2)

## 2024-11-04 LAB — BLOOD GAS, ARTERIAL
Acid-Base Excess: 0.4 mmol/L (ref 0.0–2.0)
Bicarbonate: 22.9 mmol/L (ref 20.0–28.0)
Drawn by: 548791
O2 Saturation: 96.6 %
Patient temperature: 37
pCO2 arterial: 30 mmHg — ABNORMAL LOW (ref 32–48)
pH, Arterial: 7.49 — ABNORMAL HIGH (ref 7.35–7.45)
pO2, Arterial: 77 mmHg — ABNORMAL LOW (ref 83–108)

## 2024-11-04 LAB — GLUCOSE, CAPILLARY: Glucose-Capillary: 175 mg/dL — ABNORMAL HIGH (ref 70–99)

## 2024-11-04 LAB — CYCLIC CITRUL PEPTIDE ANTIBODY, IGG/IGA: CCP Antibodies IgG/IgA: 13 U (ref 0–19)

## 2024-11-04 LAB — OSMOLALITY: Osmolality: 277 mosm/kg (ref 275–295)

## 2024-11-04 LAB — RHEUMATOID FACTOR: Rheumatoid fact SerPl-aCnc: 16.5 [IU]/mL — ABNORMAL HIGH

## 2024-11-04 LAB — ANTINUCLEAR ANTIBODIES, IFA: ANA Ab, IFA: NEGATIVE

## 2024-11-04 MED ORDER — SODIUM CHLORIDE 0.9 % IV SOLN
2.0000 g | Freq: Two times a day (BID) | INTRAVENOUS | Status: DC
  Administered 2024-11-04 (×2): 2 g via INTRAVENOUS
  Filled 2024-11-04 (×2): qty 12.5

## 2024-11-04 MED ORDER — AZITHROMYCIN 500 MG PO TABS
500.0000 mg | ORAL_TABLET | Freq: Every day | ORAL | Status: DC
  Administered 2024-11-04: 500 mg via ORAL
  Filled 2024-11-04 (×2): qty 1

## 2024-11-04 MED ORDER — SALINE SPRAY 0.65 % NA SOLN
1.0000 | NASAL | Status: DC | PRN
  Filled 2024-11-04 (×2): qty 44

## 2024-11-04 MED ORDER — HYDROCORTISONE SOD SUC (PF) 100 MG IJ SOLR
100.0000 mg | Freq: Two times a day (BID) | INTRAMUSCULAR | Status: DC
  Administered 2024-11-04 (×2): 100 mg via INTRAVENOUS
  Filled 2024-11-04 (×4): qty 2

## 2024-11-04 MED ORDER — HYDROXYZINE HCL 25 MG PO TABS
25.0000 mg | ORAL_TABLET | Freq: Once | ORAL | Status: AC
  Administered 2024-11-04: 25 mg via ORAL
  Filled 2024-11-04: qty 1

## 2024-11-04 NOTE — Progress Notes (Signed)
 ABG sample obtained x1 attempt without complication. Lab notified of specimen being sent down. RT will continue to monitor and be available as needed.

## 2024-11-04 NOTE — Progress Notes (Signed)
 When [name] requested to stand and pivot to the recliner, his O2 dropped to the 70's. He was place on a non-rebreather. Respirator was call to bring a high-flow [place]. RT put the pt on 10L high flow [place]. O2 is 88-90%.

## 2024-11-04 NOTE — Progress Notes (Signed)
 PCCM INTERVAL PROGRESS NOTE  Called to bedside to evaluate patient for hypoxia and respiratory distress. Please see full consult from Dr. Theophilus today.   Briefly this is a 89 year old male being treated for pneumonia vs amiodarone  lung toxicity or some other inflammatory process. He has required escalating oxygen throughout the evening and is now on non-rebreather. Primary service starting BiPAP and asked us  to reevaluate.   General:  elderly acute ill appearing male Neuro:  Somnolent, but will arouse briefly to answer simple questions and follow simple commands.  HEENT:  [place]/AT, No JVD noted, PERRL Cardiovascular:  RRR, no MRG Lungs:  Bibasilar fine crackles.  Abdomen:  Soft, non-distended Musculoskeletal:  No acute deformity or edema Skin:  Intact, MMM   Acute respiratory failure with hypoxia Possible pneumonia Possible amiodarone  lung toxicity - Will try heated high flow nasal cannula. Ventilating fine based on ABG.  - Doubt he would tolerate BiPAP well - DNR/DNI confirmed with family - OK to remain in PCU for the time being.  - Continue steroids, antibiotics.     Deward Eastern, AGACNP-BC [facility]  See Amion for personal pager PCCM on call pager [phone] until 7pm. Please call Elink 7p-7a. [phone]  11/04/2024 7:57 PM

## 2024-11-04 NOTE — Significant Event (Signed)
 Rapid Response Event Note   Reason for Call :  Respiratory distress  Initial Focused Assessment:  Arrived to bedside to find patient delirious, restless though able to follow commands. Lungs with some rhonchi R>L and diminished lower bilaterally. Skin warm/dry. Productive cough present. Minimal reserve, pt desats with movement and coughing episodes.   148/70 (94) HR 122  RR 35 O2 95% NRB 15L  Interventions/Plan of Care:  ABG-pending CXR  Event Summary:  MD Notified: per primary RN Call Time: 1738 Arrival Time: 1815 End Time:  Tonna Chiquita POUR, RN

## 2024-11-04 NOTE — Progress Notes (Addendum)
 "  HD#3 SUBJECTIVE:  Patient Summary: [name] is an 89 year old man with a history of CAD s/p CABG, sick sinus syndrome s/p permanent pacemaker, persistent atrial fibrillation on apixaban , hypertension, hypothyroidism, who presented with subacute progressive dyspnea and hypoxia and was admitted for acute hypoxic respiratory failure, initially concerning for pneumonia versus cardiac or inflammatory lung disease.   Overnight Events: Slept well overnight per family. Daughter-in-law noted mild restlessness.  Interim History: Daughter reports the patient has been acting differently since approximately 7:00 AM yesterday, with concern for possible confusion. On evaluation, patient was sitting in a chair in no acute distress, able to communicate appropriately, but appeared inattentive.  OBJECTIVE:  Vital Signs: Vitals:   11/04/24 0433 11/04/24 0600 11/04/24 0748 11/04/24 1129  BP: (!) 157/92  (!) 154/76 (!) 159/68  Pulse: 92  (!) 102 96  Resp: 18  16 18   Temp: 97.6 F (36.4 C)  98.1 F (36.7 C) 98.5 F (36.9 C)  TempSrc:    Oral  SpO2: 90% 90% 92% 100%  Weight:      Height:       Supplemental O2: Nasal Cannula SpO2: 100 % O2 Flow Rate (L/min): 10 L/min  Filed Weights   10/31/24 1240 11/02/24 0609 11/03/24 0443  Weight: 72.6 kg 74.3 kg 71.7 kg     Intake/Output Summary (Last 24 hours) at 11/04/2024 1145 Last data filed at 11/04/2024 0500 Gross per 24 hour  Intake --  Output 900 ml  Net -900 ml   Net IO Since Admission: -2,620 mL [11/04/24 1145]  Physical Exam: Physical Exam General: Elderly male sitting upright in bed, alert, no acute distress HEENT: Normocephalic, mucous membranes moist Neck: Supple, no JVD Cardiac: Irregularly irregular rhythm, normal S1 and S2, no murmurs Pulmonary: Bibasilar crackles, normal work of breathing at rest Abdomen: Soft, non tender, non distended Extremities: Warm, well perfused, no edema Neuro: Alert and oriented to himself, DOB and   place, inattentive and mildly delirious, no focal deficits   Patient Lines/Drains/Airways Status     Active Line/Drains/Airways     Name Placement date Placement time Site Days   Peripheral IV 10/31/24 20 G Left Antecubital 10/31/24  1456  Antecubital  4   External Urinary Catheter 10/31/24  2118  --  4            Pertinent labs and imaging:      Latest Ref Rng & Units 11/04/2024    4:04 AM 11/03/2024    3:52 AM 11/02/2024    2:38 AM  CBC  WBC 4.0 - 10.5 K/uL 16.7  10.1  11.9   Hemoglobin 13.0 - 17.0 g/dL 86.1  86.9  86.5   Hematocrit 39.0 - 52.0 % 41.5  40.1  40.2   Platelets 150 - 400 K/uL 206  210  230        Latest Ref Rng & Units 11/04/2024    4:04 AM 11/03/2024    7:53 AM 11/03/2024    3:52 AM  CMP  Glucose 70 - 99 mg/dL 855  860  804   BUN 8 - 23 mg/dL 23  27  26    Creatinine 0.61 - 1.24 mg/dL 9.04  8.91  8.91   Sodium 135 - 145 mmol/L 127  129  130   Potassium 3.5 - 5.1 mmol/L 4.5  5.0  5.7   Chloride 98 - 111 mmol/L 92  95  96   CO2 22 - 32 mmol/L 23  25  26  Calcium  8.9 - 10.3 mg/dL 8.8  8.8  8.8     No results found.  ASSESSMENT/PLAN:  Assessment: Principal Problem:   Acute hypoxic respiratory failure (HCC) Active Problems:   CAD s/p CABG 2007   SSS (sick sinus syndrome) (HCC)   Pacemaker - [employer]onic Enrhythm 2007   Essential hypertension   Acquired hypothyroidism   Benign prostatic hyperplasia with urinary hesitancy   Persistent atrial fibrillation (HCC)   Hyponatremia   Plan: [name] is an 89 year old man with a history of CAD s/p CABG, sick sinus syndrome s/p permanent pacemaker, persistent atrial fibrillation on apixaban , hypertension, hypothyroidism, who presented with subacute progressive dyspnea and hypoxia and was admitted for acute hypoxic respiratory failure, initially concerning for pneumonia versus cardiac or inflammatory lung disease.   #Acute Hypoxic Respiratory Failure Improving #Bilateral pneumonia vs.  amiodarone -induced pneumonitis  Patient presented with hypoxia, orthopnea, and bilateral interstitial opacities. Initial concern included pneumonia; however, clinical course favors volume-mediated respiratory failure with a possible inflammatory component. He improved rapidly with diuresis, has low procalcitonin, negative viral panel, and no bacteremia. Persistent exertional hypoxia prompted pulmonology evaluation, raising concern for amiodarone -induced pneumonitis versus organizing pneumonia, with underlying ILD not excluded.   Pulmonology consult:CT scan reveals bilateral infiltrates suggestive of pneumonia. Differential diagnosis includes amiodarone -induced pneumonitis due to recent amiodarone  use for atrial fibrillation management. Symptoms include fatigue, dyspnea, and a hacking cough, potentially exacerbated by amiodarone . Amiodarone  can cause lung inflammation, complicating the diagnosis. He has been off amiodarone  since December 12th. Unclear if he has underlying interstitial lung disease. Noted abnormal chest x-ray in September but I am unable to view images and not sure if it is related to this presentation.   Plan: - Start cefepime  for pneumonia - Start one more course of azithro  - DC prednisone   -Start hydrocortisone  100mg  bid - Progressive care - Incentive spirometry, pulmonary hygiene - Start CPAP at night due to snoring, being on high flow nasal cannula  - Plan for outpatient high-resolution CT chest after inflammation resolves - outpatient assessment for underlying interstitial lung disease   # Delirium Patient with hospital-associated delirium. Daughter expressed concern regarding steroid-related delirium. It was discussed that the patients confusion preceded the first dose of prednisone ; however, after shared decision-making and Pulmonology input, steroids were transitioned to hydrocortisone  given its lower association with neuropsychiatric side effects. Hydrocortisone  is also  supported in severe community-acquired pneumonia (e.g., CAPE COD trial). Plan: - Discontinue prednisone  - Start hydrocortisone  100 mg IV twice daily - Implement delirium precautions (reorientation, sleep-wake cycle optimization, minimize nighttime interruptions)    # Hyponatremia  Na: 127, Likely due to hypervolemia and reduced oral intake. Asymptomatic. Improving  clinical stabilization. Plan: - Continue volume management - No IV fluids - Daily BMP   #Hyperkalemia/  Resolved Potasium today: 4.5  Plan: - Continue daily BMP - Low potassium diet - No additional Lokelma  at this time   #Persistent Atrial Fibrillation He has persistent atrial fibrillation with early recurrence after two cardioversions, including one after amiodarone  loading. He experienced significant side effects on amiodarone , including tremor, malaise, and cough, and this medication was appropriately discontinued by cardiology. Given his age, CAD, and patient preference, rhythm control options are limited, and the plan is long term rate control with anticoagulation. He remains rate controlled at rest. Plan - Continue metoprolol  succinate 25 mg daily - Continue apixaban  5 mg twice daily - Amiodarone  started October 20 at 400 mg for three weeks, then 200 mg; discontinued December 12 due to side effects  including cough  - Telemetry monitoring while inpatient  #Sick Sinus Syndrome Status Post Pacemaker Pacemaker was placed prior to development of persistent atrial fibrillation. Recent interrogation shows appropriate function with intermittent ventricular pacing. No device related concerns at this time. Plan - No inpatient intervention - Continue routine outpatient pacemaker follow up  #Benign Prostatic Hyperplasia With LUTS He reports one month of dysuria and lower urinary tract symptoms. He previously stopped tamsulosin due to dizziness. No evidence of acute urinary retention on exam. Plan - Bladder scan if symptoms  worsen - Avoid alpha blockers inpatient given prior intolerance  #Coronary Artery Disease Status Post CABG He remains asymptomatic from an ischemic standpoint. Prior stress testing was low risk, and echocardiogram shows preserved systolic function. Plan - Continue rosuvastatin  - Continue secondary prevention  #Volume Overload / Suspected ADHF (HFpEF physiology) Despite absence of peripheral edema, patient demonstrates volume sensitivity with elevated BNP, pleural effusions, orthopnea, JVD, and rapid response to diuresis. TTE shows preserved EF with severe LA enlargement and moderate TR. Plan: - Maintain euvolemia - Strict I/Os, daily weights - Avoid IV fluids - Monitor renal function and electrolytes daily  #Hypothyroidism He is on stable levothyroxine  dosing. Given reported weight loss and fatigue, reassessment of thyroid  function is reasonable. Plan - Continue levothyroxine  125 mcg daily   Best Practice: Diet: Regular diet IVF: None VTE: Apixaban   Code: DNR  Disposition planning: Therapy Recs: HHOT/PT Family Contact:  at bedside. DISPO: Anticipated discharge  TBD to  pending medical readiness.  Signature:  Ori Kreiter [facility]  11:45 AM, 11/04/2024  On Call pager [phone]  "

## 2024-11-04 NOTE — Progress Notes (Signed)
 Paged Dr. Noorudin, His family is concerned that the Prednisone  is causing him to be sleepy - not his baseline. They want it tapered and him off of it. They are also concerned about his low urine out put and dehydration. They want his electrolytes and kidney function checked. His family is at the bedside waiting for an answer about their concerns. He responded, We're the covering team, if they want to speak with the primary doctors they'll have to wait until the morning to speak to them. I can also have them give family a call in the morning. I asked him to please do so and I informed the pt's family.

## 2024-11-04 NOTE — Progress Notes (Addendum)
 "  NAME:  [name], MRN:  [id number], DOB:  [birth year], LOS: 3 ADMISSION DATE:  10/31/2024, CONSULTATION DATE:  11/02/2024 REFERRING MD:   , CHIEF COMPLAINT:  Acute hypoxic respiratory failure  History of Present Illness:   The patient, with atrial fibrillation on eliquis , presents with shortness of breath and fatigue and abnormal CT  Dyspnea and fatigue - Shortness of breath and fatigue, improved compared to initial presentation - Oxygen saturation drops when sitting up - No history of pneumonia, COPD, or asthma  Cough - Persistent hacking cough, worse when drinking liquids - Cough improves when drinking slowly - Cough developed as a side effect of amiodarone   Abnormal chest imaging - Recent CT scan abnormal - Chest x-ray in September showed congestion, possibly due to bronchitis  Medication history - Amiodarone  started October 20 at 400 mg for three weeks, then 200 mg; discontinued December 12 due to side effects including cough - Eliquis  for atrial fibrillation  Environmental and occupational exposures - Never smoked - No asbestos, chemical, mold, hot tub, jacuzzi, feather bedding, or pet exposures - Retired chief of staff; previous work in human resources officer  Pertinent  Medical History    has a past medical history of Arthritis, Hyperlipidemia, Hypertension, Hypothyroidism, Paroxysmal atrial fibrillation (HCC) (11/22/2014), and Thrombocytopenia (05/23/2015).   Significant Hospital Events: Including procedures, antibiotic start and stop dates in addition to other pertinent events   12/23 PCCM consulted.  Started prednisone  40 mg/day  Interim History / Subjective:   More confused and somnolent.  Has increased O2 requirements requiring high flow nasal cannula at 10 L  Objective    Blood pressure (!) 154/76, pulse (!) 102, temperature 98.1 F (36.7 C), resp. rate 16, height 5' 8 (1.727 m), weight 71.7 kg, SpO2 92%.        Intake/Output Summary (Last 24 hours) at  11/04/2024 1032 Last data filed at 11/04/2024 0500 Gross per 24 hour  Intake --  Output 900 ml  Net -900 ml   Filed Weights   10/31/24 1240 11/02/24 0609 11/03/24 0443  Weight: 72.6 kg 74.3 kg 71.7 kg    Examination: Elderly male Somnolent, arousable Scattered rhonchi, heart rate regular rate and rhythm Extremities with no edema  Sodium 127, WBC 16.7 Chest x-ray with worsening bilateral consolidative opacities. ANA negative, rheumatoid factor 16.5  Resolved problem list   Assessment and Plan  Bilateral pneumonia vs. amiodarone -induced pneumonitis CT scan reveals bilateral infiltrates suggestive of pneumonia. Differential diagnosis includes amiodarone -induced pneumonitis due to recent amiodarone  use for atrial fibrillation management. Symptoms include fatigue, dyspnea, and a hacking cough, potentially exacerbated by amiodarone . Amiodarone  can cause lung inflammation, complicating the diagnosis. He has been off amiodarone  since December 12th.  Unclear if he has underlying interstitial lung disease.  Noted abnormal chest x-ray in September but I am unable to view images and not sure if it is related to this presentation.  Today he has worsening respiratory status with infiltrates. Discussed with family and primary team Will restart antibiotics with broad coverage Change prednisone  to hydrocortisone  100 twice daily as family is concerned about delirium with prednisone .  Hydrocortisone  is also recommended for severe community-acquired pneumonia in the [facility] trial Start CPAP at night due to snoring, being on high flow nasal cannula Will need outpatient assessment for underlying interstitial lung disease  Code status is DNR  Signature:   Lonna Coder MD [facility] See Amion for pager  If no response to pager , please call [phone]  until 7pm After 7:00 pm call Elink  [phone] 11/04/2024, 10:32 AM        "

## 2024-11-04 NOTE — Progress Notes (Signed)
 Pharmacy Antibiotic Note  [name] is a 89 y.o. male admitted on 10/31/2024 with pneumonia.  Pharmacy has been consulted for cefepime  dosing. Patient was on ceftriaxone  + azithromycin  for CAP but experienced worsening in respiratory and mental status this morning. Antimicrobial coverage has been broadened from ceftriaxone  to cefepime .   Plan: Cefepime  2g Q12H Monitor renal function, s/sx infection, and opportunities for de-escalation as able  Height: 5' 8 (172.7 cm) Weight: 71.7 kg (158 lb) IBW/kg (Calculated) : 68.4  Temp (24hrs), Avg:97.4 F (36.3 C), Min:96.6 F (35.9 C), Max:98.1 F (36.7 C)  Recent Labs  Lab 10/31/24 1314 10/31/24 1507 10/31/24 1744 11/01/24 0129 11/02/24 0238 11/03/24 0352 11/03/24 0753 11/04/24 0404  WBC 15.2*  --   --  11.8* 11.9* 10.1  --  16.7*  CREATININE 1.03  --   --  1.16 1.05 1.08 1.08 0.95  LATICACIDVEN  --  2.1* 1.5  --   --   --   --   --     Estimated Creatinine Clearance: 51 mL/min (by C-G formula based on SCr of 0.95 mg/dL).    Allergies[1]  Antimicrobials this admission: Azithromycin  12/21>>12/23, 12/25>>(12/28) Ceftriaxone  12/21>>12/24 Cefepime  12/25>>   Dose adjustments this admission: None  Microbiology results: 12/21 Flu/COVID/RSV: negative 12/21 respiratory 20-pathogen panel: negative 12/21 BCx: no growth to date x 3 days  Thank you for allowing pharmacy to be a part of this patients care.  Maurilio Patten, PharmD PGY1 Pharmacy Resident [facility] 11/04/2024 10:56 AM    [1]  Allergies Allergen Reactions   Latex Itching    PT STATES THAT AFTER WEARING LATEX GLOVES TO WORK IN THAT HIS HANDS WERE VERY ITCHY.   Sulfa Antibiotics Rash

## 2024-11-04 NOTE — Progress Notes (Signed)
"   This nurse in room to assess patient. patient has been having increased work of breathing and increasing O2 requirements over my shift. This nurse placed patient on NRB mask at 15L/min and notified Rapid response and Respiratory therapy. MD notified by secure chat  with new orders for chest x-ray 1 view and ABG Patient in lowest position with call light in reach safety ensured with current plan of care in progress  "

## 2024-11-04 NOTE — Progress Notes (Addendum)
 Pt is experiencing hallucinations per family. Family is concerned that prednisone  is having a adverse affect. Possibly could hospital delirium. Also, pt hasn't urinated since 0730, pt denied urgency to urinate. Later bladder scanned pt 119 mL. Pt's urine output was end of shift.. Pt desat to 85-86 on 2.5L just from moving from bed to chair. Pt denied SOB but pt was agonal breathing and displayed distress during moving. Pt later desat 81-82% when transferring from bed to [facility]. Pt placed on 4.5L after this. Pt is super weak. Pt cannot ambulate with walker safely to measure O2 sat. Will have to revisit this another time as requested by dr. Dr aware per above. No new orders.

## 2024-11-04 NOTE — Progress Notes (Signed)
 Over course of night shift pt had to be placed on 11L. Currently sat 92% on 11L while resting in bed. Dr notified to have pt transferred to higher level of care since pts can only be on 6L or below on this unit. Awaiting transfer orders.

## 2024-11-05 ENCOUNTER — Other Ambulatory Visit (HOSPITAL_COMMUNITY): Payer: Self-pay

## 2024-11-05 LAB — CULTURE, BLOOD (ROUTINE X 2)
Culture: NO GROWTH
Culture: NO GROWTH
Special Requests: ADEQUATE

## 2024-11-05 LAB — BASIC METABOLIC PANEL WITH GFR
Anion gap: 11 (ref 5–15)
BUN: 27 mg/dL — ABNORMAL HIGH (ref 8–23)
CO2: 22 mmol/L (ref 22–32)
Calcium: 8.5 mg/dL — ABNORMAL LOW (ref 8.9–10.3)
Chloride: 95 mmol/L — ABNORMAL LOW (ref 98–111)
Creatinine, Ser: 1.09 mg/dL (ref 0.61–1.24)
GFR, Estimated: >60 mL/min
Glucose, Bld: 165 mg/dL — ABNORMAL HIGH (ref 70–99)
Potassium: 4.8 mmol/L (ref 3.5–5.1)
Sodium: 128 mmol/L — ABNORMAL LOW (ref 135–145)

## 2024-11-05 LAB — CBC
HCT: 39.5 % (ref 39.0–52.0)
Hemoglobin: 13.1 g/dL (ref 13.0–17.0)
MCH: 27.5 pg (ref 26.0–34.0)
MCHC: 33.2 g/dL (ref 30.0–36.0)
MCV: 82.8 fL (ref 80.0–100.0)
Platelets: 132 K/uL — ABNORMAL LOW (ref 150–400)
RBC: 4.77 MIL/uL (ref 4.22–5.81)
RDW: 14.6 % (ref 11.5–15.5)
WBC: 16.7 K/uL — ABNORMAL HIGH (ref 4.0–10.5)
nRBC: 0 % (ref 0.0–0.2)

## 2024-11-05 MED ORDER — MORPHINE SULFATE (PF) 2 MG/ML IV SOLN
2.0000 mg | INTRAVENOUS | Status: DC | PRN
  Administered 2024-11-05 (×2): 2 mg via INTRAVENOUS
  Administered 2024-11-05 (×2): 4 mg via INTRAVENOUS
  Administered 2024-11-05 (×2): 2 mg via INTRAVENOUS
  Filled 2024-11-05: qty 1
  Filled 2024-11-05 (×3): qty 2
  Filled 2024-11-05: qty 1

## 2024-11-05 MED ORDER — POLYVINYL ALCOHOL 1.4 % OP SOLN: 1.0000 [drp] | Freq: Four times a day (QID) | OPHTHALMIC | Status: DC | PRN

## 2024-11-05 MED ORDER — GLYCOPYRROLATE 0.2 MG/ML IJ SOLN
0.2000 mg | INTRAMUSCULAR | Status: DC | PRN
  Administered 2024-11-05: 0.2 mg via INTRAVENOUS
  Filled 2024-11-05: qty 1

## 2024-11-05 MED ORDER — ONDANSETRON 4 MG PO TBDP: 4.0000 mg | ORAL_TABLET | Freq: Four times a day (QID) | ORAL | Status: DC | PRN

## 2024-11-05 MED ORDER — LORAZEPAM 2 MG/ML IJ SOLN
2.0000 mg | INTRAMUSCULAR | Status: DC | PRN
  Administered 2024-11-05 (×2): 2 mg via INTRAVENOUS
  Filled 2024-11-05 (×3): qty 1

## 2024-11-05 MED ORDER — HYDROXYZINE HCL 25 MG PO TABS: 25.0000 mg | ORAL_TABLET | Freq: Two times a day (BID) | ORAL | Status: DC | PRN

## 2024-11-05 MED ORDER — GLYCOPYRROLATE 0.2 MG/ML IJ SOLN
0.2000 mg | INTRAMUSCULAR | Status: DC | PRN
  Filled 2024-11-05 (×2): qty 1

## 2024-11-05 MED ORDER — INSULIN ASPART 100 UNIT/ML IJ SOLN: 2.0000 [IU] | Freq: Three times a day (TID) | INTRAMUSCULAR | Status: DC

## 2024-11-05 MED ORDER — HYDROXYZINE HCL 10 MG PO TABS: 10.0000 mg | ORAL_TABLET | Freq: Three times a day (TID) | ORAL | Status: DC | PRN

## 2024-11-05 MED ORDER — ACETAMINOPHEN 650 MG RE SUPP: 650.0000 mg | Freq: Four times a day (QID) | RECTAL | Status: DC | PRN

## 2024-11-05 MED ORDER — GLYCOPYRROLATE 1 MG PO TABS: 1.0000 mg | ORAL_TABLET | ORAL | Status: DC | PRN

## 2024-11-05 MED ORDER — ONDANSETRON HCL 4 MG/2ML IJ SOLN: 4.0000 mg | Freq: Four times a day (QID) | INTRAMUSCULAR | Status: DC | PRN

## 2024-11-05 MED ORDER — ACETAMINOPHEN 325 MG PO TABS: 650.0000 mg | ORAL_TABLET | Freq: Four times a day (QID) | ORAL | Status: DC | PRN

## 2024-11-05 MED ORDER — MORPHINE 100MG IN NS 100ML (1MG/ML) PREMIX INFUSION: 1.0000 mg/h | INTRAVENOUS | Status: DC

## 2024-11-05 MED ORDER — SODIUM CHLORIDE 0.9 % IV SOLN: INTRAVENOUS | Status: DC

## 2024-11-06 ENCOUNTER — Other Ambulatory Visit (HOSPITAL_COMMUNITY): Payer: Self-pay

## 2024-11-08 ENCOUNTER — Telehealth: Payer: Self-pay

## 2024-11-08 NOTE — Telephone Encounter (Signed)
 Dee with [facility] calling needing signature for death certificate.  Stating Dr. Angelene name is not pulling up in database.  Can another doctor assist with this?

## 2024-11-11 NOTE — Progress Notes (Signed)
 PCCM note  Patient with escalating O2 requirements overnight Looks like progressive ARDS Transition to comfort measures which is appropriate given his goals of care. Discussed with primary team and family.  Deserea Bordley MD [facility] See Amion for pager  If no response to pager , please call [phone] until 7pm After 7:00 pm call Elink  [phone] 11-21-24, 1:33 PM

## 2024-11-11 NOTE — Progress Notes (Signed)
 This nurse notified by NA that patient breathing and pulse had lessened this nurse and melinda RN entered room to verify cease of vital functions. @ RN's listened for 5 minutes for evidence of respirations and heart sounds but found none and per order patient was pronounced dead at 1535 hours.MD and patient placement notified per protocol.Post mortem care and flowsheet completed with honor bridge call placed.

## 2024-11-11 NOTE — Death Summary Note (Signed)
" °  Name: FITZGERALD DUNNE MRN: [id number] DOB: 13-Nov-1932 89 y.o.  Date of Admission: No admission date for patient encounter. Date of Discharge: 11/08/2024 Attending Physician: Jaye Fallow, MD  Discharge Diagnosis: Acute hypoxic respiratory failure Bilateral pneumonia versus amiodarone -induced pneumonitis Persistent atrial fibrillation Coronary artery disease status post CABG Sick sinus syndrome status post permanent pacemaker Hyponatremia Hospital-associated delirium  Cause of death: Acute hypoxic respiratory failure secondary to progressive bilateral lung disease (pneumonia versus inflammatory pneumonitis). Time of death: As documented in the medical record.  Disposition and follow-up:   Mr. Yousuf Ager was pronounced deceased at [facility] on 11/08/2024. Family was present and notified. Code status was DNR.  Hospital Course: Mr. Cangelosi was admitted with subacute progressive dyspnea and hypoxia and diagnosed with acute hypoxic respiratory failure. Initial evaluation raised concern for pneumonia versus cardiac etiology, and he was treated with antibiotics and diuresis with transient improvement. Despite therapy, his respiratory status worsened with increasing oxygen requirements and progressive bilateral pulmonary infiltrates on imaging.  Pulmonology was consulted due to concern for amiodarone -induced pneumonitis versus inflammatory lung disease, given recent amiodarone  exposure and radiographic findings. Systemic steroids were initiated and later transitioned to hydrocortisone  due to concern for steroid-associated delirium. His course was complicated by worsening hypoxia, leukocytosis, hyponatremia, and hospital-associated delirium.  Despite escalation of supportive care, including high-flow oxygen and broad-spectrum antibiotics, his respiratory failure progressed. In accordance with the patients wishes and family discussions, care remained comfort-focused under DNR  status. He subsequently expired peacefully.  Signed: Bernadine Manos, MD 11/08/2024, 12:53 PM    "

## 2024-11-11 NOTE — Progress Notes (Signed)
" °   2024/11/29 1410  Spiritual Encounters  Type of Visit Initial  Care provided to: Pt and family  Conversation partners present during encounter Nurse  Referral source Physician  Reason for visit End-of-life  OnCall Visit No  Spiritual Framework  Presenting Themes Impactful experiences and emotions  Community/Connection Family  Patient Stress Factors Health changes;Major life changes  Family Stress Factors Exhausted  Interventions  Spiritual Care Interventions Made Compassionate presence;Established relationship of care and support;Normalization of emotions  Intervention Outcomes  Outcomes Awareness of health;Awareness of support;Reduced anxiety   Chaplain spent time with the family as they shared memories and reflect on stories that honored their loved one. Chaplain listened with care and offered spiritual and emotional support. At the family's request, chaplain offered a word of prayer during this seasons of transition. The family expressed gratitude for the visit. Chaplain remains reachable for continued support. "

## 2024-11-11 NOTE — Progress Notes (Signed)
 OT Cancellation Note  Patient Details Name: [name] MRN: [id number] DOB: 1933-01-09   Cancelled Treatment:    Reason Eval/Treat Not Completed: Other (comment). Per RN, pt transitioning to comfort care. Will d/c OT services.    Tamyra Fojtik C, OT  Acute Rehabilitation Services Office [phone] Secure chat preferred   Adrianne GORMAN Savers 11/29/24, 1:12 PM

## 2024-11-11 NOTE — Progress Notes (Signed)
 Goals of Care / Transition to Comfort-Focused Care A family meeting was held today with the patients  wife, son, daughter and daughter-in-law at bedside. The patients overall clinical trajectory, advanced age, progressive respiratory failure, and limited likelihood of meaningful recovery were discussed in detail. The family expressed a clear understanding of the patients condition and prognosis.  After shared decision-making, the family stated that the patient would not want further life-prolonging or aggressive interventions and expressed a preference to focus solely on comfort and symptom relief. They specifically requested comfort-focused care provided by the primary medical team and bedside nursing staff, and stated that they do not wish to involve the palliative care consult service at this time.  The family confirmed agreement with: DNR/DNI status Transition to comfort-focused measures only Prioritization of symptom management, including relief of dyspnea, anxiety, and agitation Discontinuation of non-comfort-directed medications, labs, and monitoring They voiced appreciation for the care provided and demonstrated clear understanding of the plan. All questions were answered. The plan was discussed with the bedside RN.  Plan: Transition to comfort care Discontinue non-comfort medications, labs, and telemetry Continue oxygen for comfort PRN medications for dyspnea, pain, anxiety, and agitation per comfort care order set Nursing to prioritize comfort, repositioning, and symptom assessment Reassess frequently for symptom control Palliative care consult deferred per family preference, but remains available if requested

## 2024-11-11 NOTE — Progress Notes (Signed)
 PT Cancellation Note  Patient Details Name: [name] MRN: [id number] DOB: January 16, 1933   Cancelled Treatment:    Reason Eval/Treat Not Completed: (P) Medical issues which prohibited therapy, pt transitioning to comfort care, will hold therapies at this time.   Therisa SAUNDERS. PTA Acute Rehabilitation Services Office: [phone]    Therisa CHRISTELLA Boor 2024-11-28, 9:33 AM

## 2024-11-11 DEATH — deceased

## 2024-11-18 ENCOUNTER — Ambulatory Visit: Admitting: Cardiovascular Disease

## 2024-11-23 ENCOUNTER — Ambulatory Visit: Admit: 2024-11-23 | Admitting: Ophthalmology

## 2024-11-23 SURGERY — PHACOEMULSIFICATION, CATARACT, WITH IOL INSERTION
Anesthesia: Topical | Laterality: Right

## 2024-12-07 ENCOUNTER — Ambulatory Visit: Admit: 2024-12-07 | Admitting: Ophthalmology

## 2024-12-07 SURGERY — PHACOEMULSIFICATION, CATARACT, WITH IOL INSERTION
Anesthesia: Topical | Laterality: Left

## 2024-12-08 ENCOUNTER — Encounter

## 2025-02-04 ENCOUNTER — Ambulatory Visit: Admitting: Urology

## 2025-03-09 ENCOUNTER — Encounter

## 2025-06-08 ENCOUNTER — Encounter
